# Patient Record
Sex: Female | Born: 1975 | Race: Black or African American | Hispanic: No | Smoking: Current every day smoker
Health system: Southern US, Community
[De-identification: ages and names within clinical notes are randomized; demographics above are authoritative.]

## PROBLEM LIST (undated history)

## (undated) DIAGNOSIS — K5792 Diverticulitis of intestine, part unspecified, without perforation or abscess without bleeding: Secondary | ICD-10-CM

## (undated) DIAGNOSIS — J45909 Unspecified asthma, uncomplicated: Secondary | ICD-10-CM

## (undated) DIAGNOSIS — T4145XA Adverse effect of unspecified anesthetic, initial encounter: Secondary | ICD-10-CM

## (undated) DIAGNOSIS — F32A Depression, unspecified: Secondary | ICD-10-CM

## (undated) DIAGNOSIS — I10 Essential (primary) hypertension: Secondary | ICD-10-CM

## (undated) DIAGNOSIS — T8859XA Other complications of anesthesia, initial encounter: Secondary | ICD-10-CM

## (undated) DIAGNOSIS — E785 Hyperlipidemia, unspecified: Secondary | ICD-10-CM

## (undated) DIAGNOSIS — D219 Benign neoplasm of connective and other soft tissue, unspecified: Secondary | ICD-10-CM

## (undated) HISTORY — PX: TUBAL LIGATION: SHX77

## (undated) HISTORY — DX: Benign neoplasm of connective and other soft tissue, unspecified: D21.9

## (undated) HISTORY — DX: Hyperlipidemia, unspecified: E78.5

## (undated) HISTORY — DX: Depression, unspecified: F32.A

## (undated) HISTORY — PX: GANGLION CYST EXCISION: SHX1691

---

## 1998-08-19 ENCOUNTER — Other Ambulatory Visit: Admission: RE | Admit: 1998-08-19 | Discharge: 1998-08-19 | Payer: Self-pay | Admitting: Obstetrics

## 1998-11-10 ENCOUNTER — Emergency Department (HOSPITAL_COMMUNITY): Admission: EM | Admit: 1998-11-10 | Discharge: 1998-11-10 | Payer: Self-pay | Admitting: Emergency Medicine

## 1998-11-10 ENCOUNTER — Encounter: Payer: Self-pay | Admitting: Emergency Medicine

## 2000-01-28 ENCOUNTER — Emergency Department (HOSPITAL_COMMUNITY): Admission: EM | Admit: 2000-01-28 | Discharge: 2000-01-28 | Payer: Self-pay | Admitting: Emergency Medicine

## 2000-01-31 ENCOUNTER — Other Ambulatory Visit: Admission: RE | Admit: 2000-01-31 | Discharge: 2000-01-31 | Payer: Self-pay | Admitting: Obstetrics

## 2000-03-12 ENCOUNTER — Emergency Department (HOSPITAL_COMMUNITY): Admission: EM | Admit: 2000-03-12 | Discharge: 2000-03-12 | Payer: Self-pay | Admitting: Emergency Medicine

## 2000-04-24 ENCOUNTER — Emergency Department (HOSPITAL_COMMUNITY): Admission: EM | Admit: 2000-04-24 | Discharge: 2000-04-24 | Payer: Self-pay | Admitting: Emergency Medicine

## 2000-04-24 ENCOUNTER — Encounter: Payer: Self-pay | Admitting: Emergency Medicine

## 2001-03-16 ENCOUNTER — Encounter: Payer: Self-pay | Admitting: Emergency Medicine

## 2001-03-16 ENCOUNTER — Emergency Department (HOSPITAL_COMMUNITY): Admission: EM | Admit: 2001-03-16 | Discharge: 2001-03-16 | Payer: Self-pay | Admitting: Emergency Medicine

## 2002-02-22 ENCOUNTER — Emergency Department (HOSPITAL_COMMUNITY): Admission: EM | Admit: 2002-02-22 | Discharge: 2002-02-22 | Payer: Self-pay | Admitting: Emergency Medicine

## 2002-03-26 ENCOUNTER — Emergency Department (HOSPITAL_COMMUNITY): Admission: EM | Admit: 2002-03-26 | Discharge: 2002-03-26 | Payer: Self-pay | Admitting: Emergency Medicine

## 2002-03-26 ENCOUNTER — Encounter: Payer: Self-pay | Admitting: Emergency Medicine

## 2002-12-03 ENCOUNTER — Emergency Department (HOSPITAL_COMMUNITY): Admission: EM | Admit: 2002-12-03 | Discharge: 2002-12-03 | Payer: Self-pay | Admitting: *Deleted

## 2002-12-09 ENCOUNTER — Emergency Department (HOSPITAL_COMMUNITY): Admission: EM | Admit: 2002-12-09 | Discharge: 2002-12-09 | Payer: Self-pay | Admitting: Emergency Medicine

## 2003-05-20 ENCOUNTER — Emergency Department (HOSPITAL_COMMUNITY): Admission: AD | Admit: 2003-05-20 | Discharge: 2003-05-20 | Payer: Self-pay | Admitting: Family Medicine

## 2004-07-18 ENCOUNTER — Emergency Department (HOSPITAL_COMMUNITY): Admission: EM | Admit: 2004-07-18 | Discharge: 2004-07-18 | Payer: Self-pay | Admitting: Emergency Medicine

## 2004-08-17 ENCOUNTER — Emergency Department: Payer: Self-pay | Admitting: Emergency Medicine

## 2004-09-03 ENCOUNTER — Emergency Department (HOSPITAL_COMMUNITY): Admission: EM | Admit: 2004-09-03 | Discharge: 2004-09-03 | Payer: Self-pay | Admitting: Emergency Medicine

## 2004-09-17 ENCOUNTER — Emergency Department: Payer: Self-pay | Admitting: Emergency Medicine

## 2006-06-13 ENCOUNTER — Emergency Department (HOSPITAL_COMMUNITY): Admission: EM | Admit: 2006-06-13 | Discharge: 2006-06-13 | Payer: Self-pay | Admitting: Family Medicine

## 2007-07-07 ENCOUNTER — Emergency Department (HOSPITAL_COMMUNITY): Admission: EM | Admit: 2007-07-07 | Discharge: 2007-07-07 | Payer: Self-pay | Admitting: Family Medicine

## 2008-03-29 ENCOUNTER — Emergency Department (HOSPITAL_COMMUNITY): Admission: EM | Admit: 2008-03-29 | Discharge: 2008-03-29 | Payer: Self-pay | Admitting: Family Medicine

## 2008-04-01 ENCOUNTER — Emergency Department (HOSPITAL_COMMUNITY): Admission: EM | Admit: 2008-04-01 | Discharge: 2008-04-01 | Payer: Self-pay | Admitting: Family Medicine

## 2009-01-07 ENCOUNTER — Emergency Department (HOSPITAL_COMMUNITY): Admission: EM | Admit: 2009-01-07 | Discharge: 2009-01-07 | Payer: Self-pay | Admitting: Family Medicine

## 2009-01-09 ENCOUNTER — Emergency Department (HOSPITAL_COMMUNITY): Admission: EM | Admit: 2009-01-09 | Discharge: 2009-01-09 | Payer: Self-pay | Admitting: Emergency Medicine

## 2009-06-01 ENCOUNTER — Emergency Department (HOSPITAL_COMMUNITY): Admission: EM | Admit: 2009-06-01 | Discharge: 2009-06-01 | Payer: Self-pay | Admitting: Emergency Medicine

## 2009-08-27 ENCOUNTER — Emergency Department (HOSPITAL_COMMUNITY): Admission: EM | Admit: 2009-08-27 | Discharge: 2009-08-27 | Payer: Self-pay | Admitting: Emergency Medicine

## 2009-09-09 ENCOUNTER — Emergency Department (HOSPITAL_COMMUNITY)
Admission: EM | Admit: 2009-09-09 | Discharge: 2009-09-09 | Payer: Self-pay | Source: Home / Self Care | Admitting: Emergency Medicine

## 2010-05-18 ENCOUNTER — Emergency Department (HOSPITAL_COMMUNITY): Admission: EM | Admit: 2010-05-18 | Discharge: 2010-05-18 | Payer: Self-pay | Admitting: Family Medicine

## 2010-05-26 ENCOUNTER — Emergency Department (HOSPITAL_COMMUNITY): Admission: EM | Admit: 2010-05-26 | Discharge: 2010-05-26 | Payer: Self-pay | Admitting: Family Medicine

## 2010-07-09 IMAGING — CT CT NECK W/ CM
3 of 4 series · 16 of 33 positions shown, 19 images · IV contrast (100 ML OMNI 300)
Comparison: None available.

CLINICAL DATA: Sore throat.  Hoarseness.

CT NECK WITH CONTRAST
TECHNIQUE: Multidetector CT imaging of the neck was performed with
intravenous contrast.
Contrast: 100 ml Omnipaque 300

[Series 2: 2cc/(id)/1cc/(id) · axial · 0.47mm/px · z∈[-226,-27]mm · 8 of 65 slices shown, 10 images]
[im 6/65  soft-tissue]
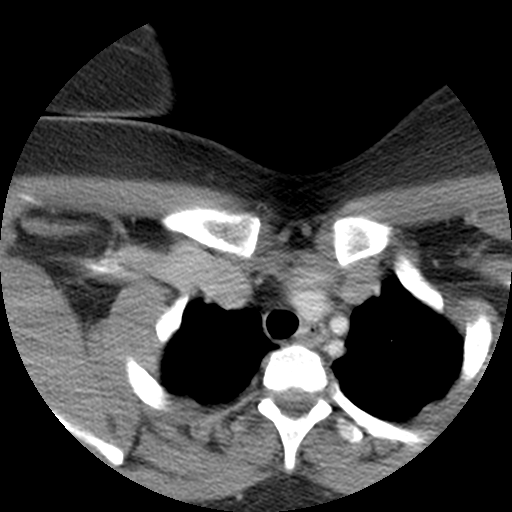
[im 6/65  bone]
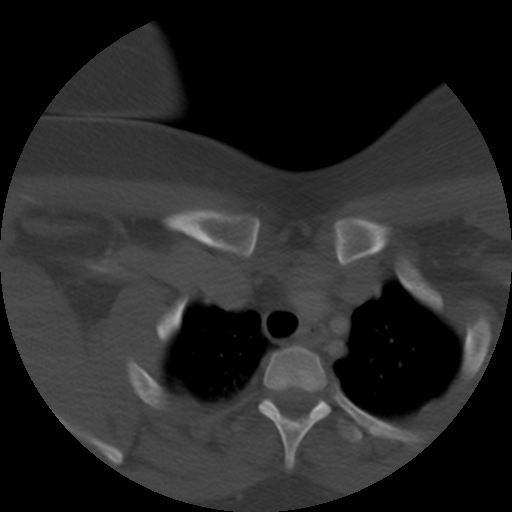
[im 12/65  bone]
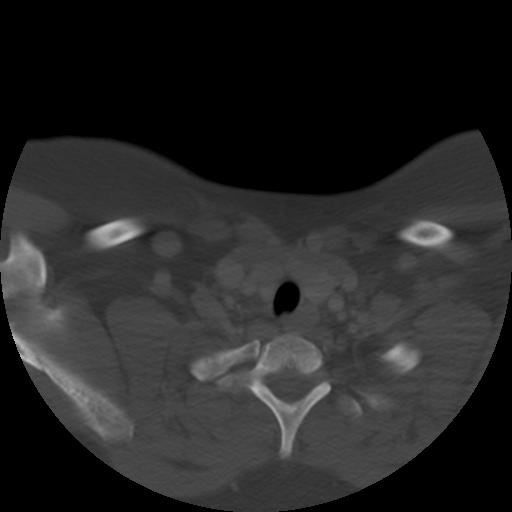
[im 24/65  bone]
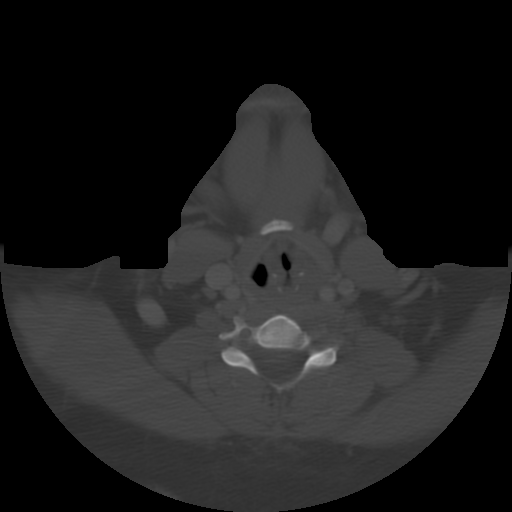
[im 30/65  bone]
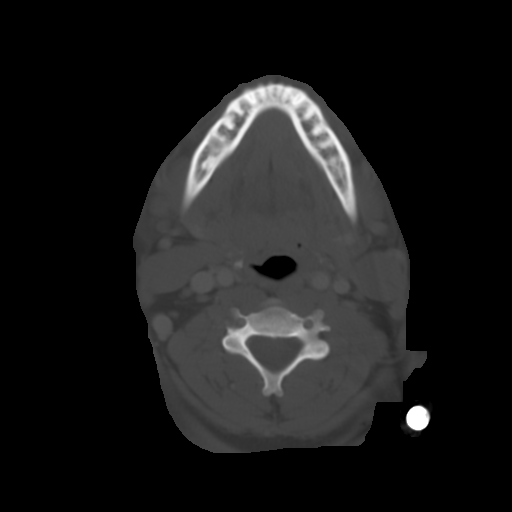
[im 35/65  soft-tissue]
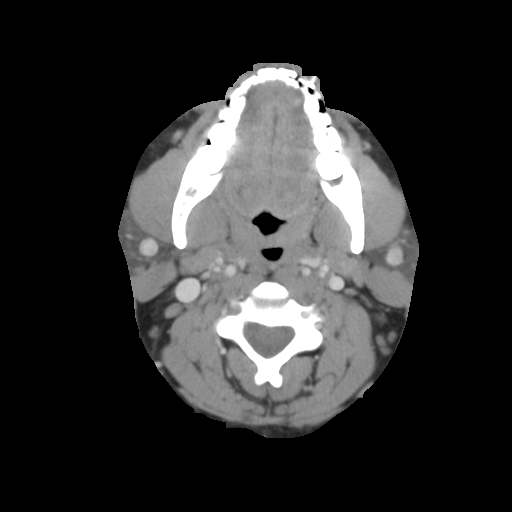
[im 35/65  bone]
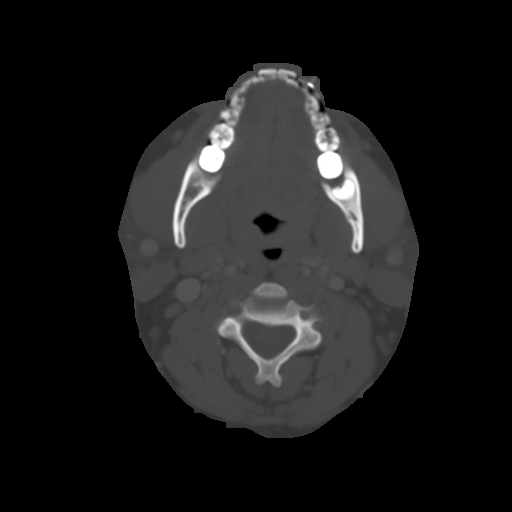
[im 41/65  bone]
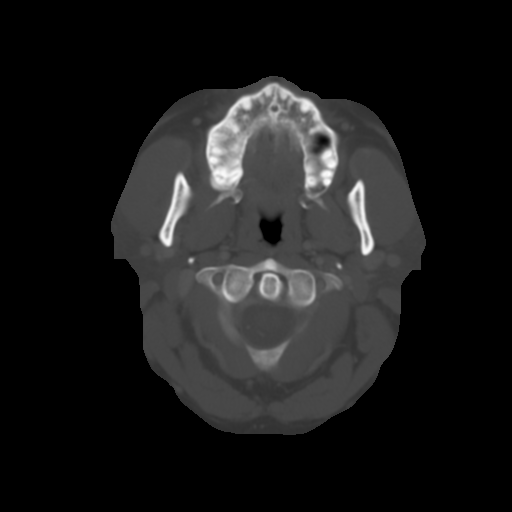
[im 53/65  bone]
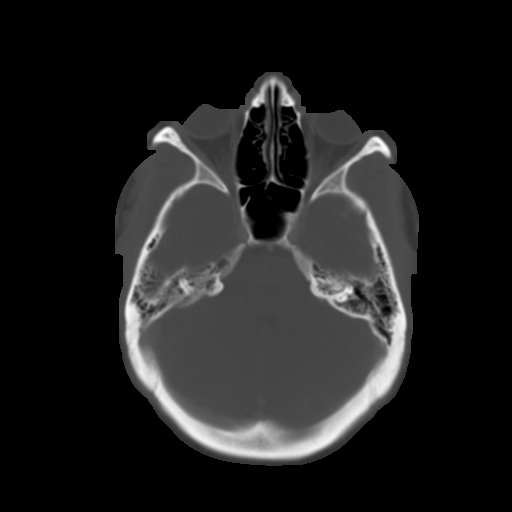
[im 59/65  bone]
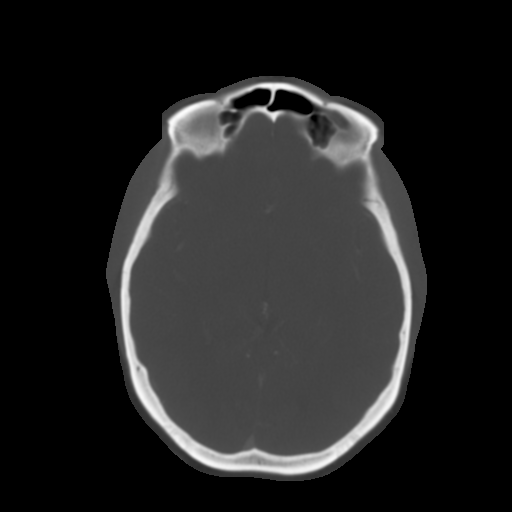

[Series 300: cor · coronal · 0.48mm/px · 3 of 66 slices shown]
[im 14/66  bone]
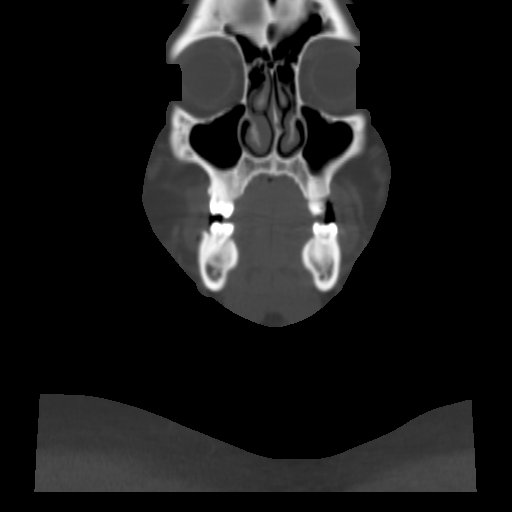
[im 27/66  bone]
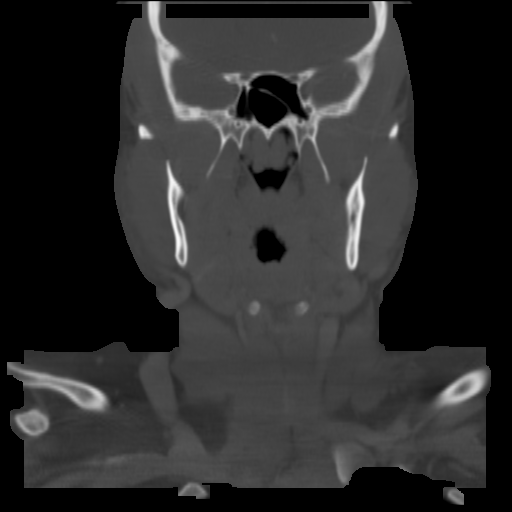
[im 40/66  bone]
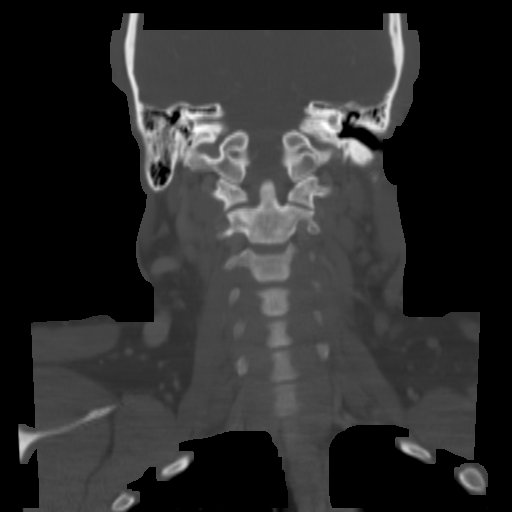

[Series 301: sag · sagittal · 0.48mm/px · 5 of 55 slices shown, 6 images]
[im 19/55  bone]
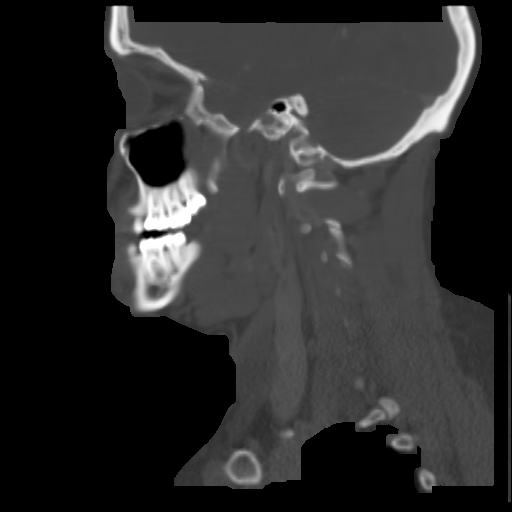
[im 23/55  bone]
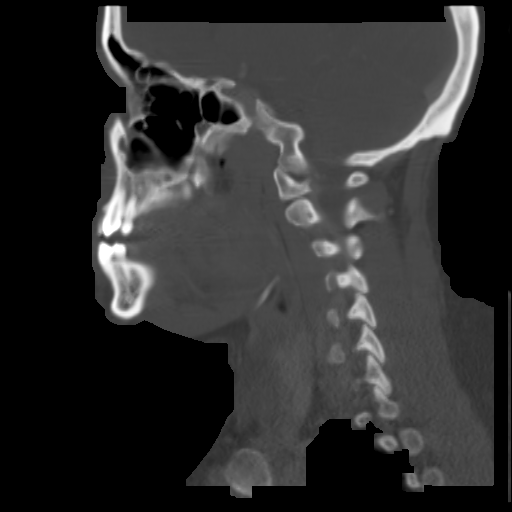
[im 28/55  soft-tissue]
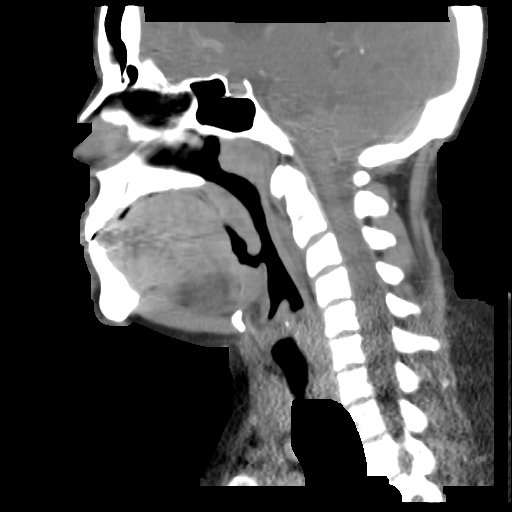
[im 28/55  bone]
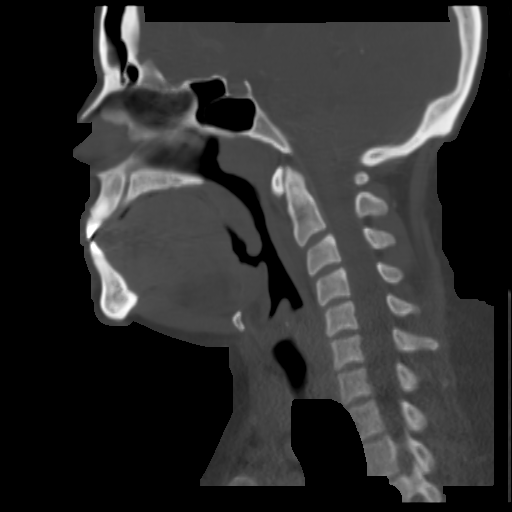
[im 32/55  bone]
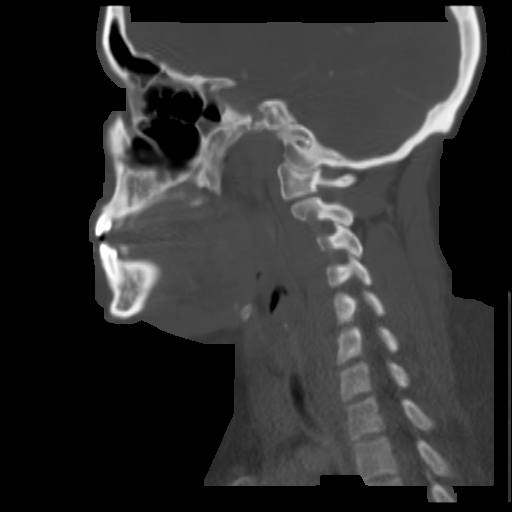
[im 37/55  bone]
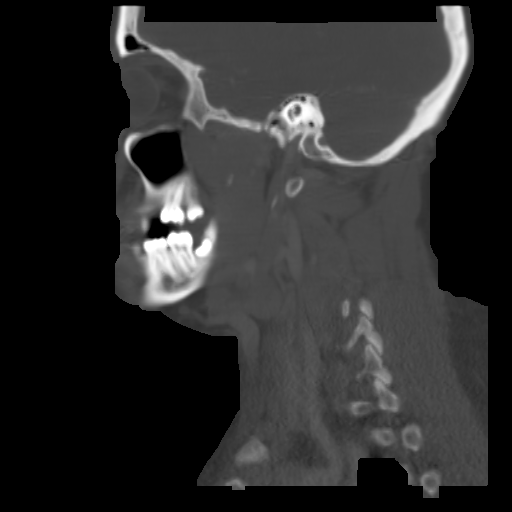

[16 of 33 positions shown; findings below may reference images not displayed]

FINDINGS: Limited imaging of the brain is within normal limits.
There is prominence of the adenoid tissue without focal lesion.
The tongue base and tonsillar regions are unremarkable.  No focal
fluid collection is present.  The mucosal and submucosal spaces are
within normal limits.

Bilateral enlarged submandibular lymph nodes are present.  The
largest is a 13 mm long axis node on the right.  There are
borderline level II lymph nodes bilaterally with a jugulodigastric
node on the left measuring up to 15 mm.  No other significant
pathologically enlarged adenopathy is present.

Lung apices are clear.  Bone windows are unremarkable.
IMPRESSION: 1.  No focal fluid collection to suggest abscess.
2.  Prominent adenoid tissue is likely reactive.  The HIV can give
a similar appearance.
3.  Enlarged submandibular and level II adenopathy is likely
reactive.  Please see the above report.

## 2010-11-22 LAB — DIFFERENTIAL
Eosinophils Absolute: 0 10*3/uL (ref 0.0–0.7)
Eosinophils Relative: 0 % (ref 0–5)
Lymphocytes Relative: 13 % (ref 12–46)
Monocytes Absolute: 0.2 10*3/uL (ref 0.1–1.0)
Neutrophils Relative %: 86 % — ABNORMAL HIGH (ref 43–77)

## 2010-11-22 LAB — CBC
MCHC: 32.8 g/dL (ref 30.0–36.0)
RBC: 5.21 MIL/uL — ABNORMAL HIGH (ref 3.87–5.11)

## 2010-11-22 LAB — BASIC METABOLIC PANEL
CO2: 25 mEq/L (ref 19–32)
Calcium: 9.1 mg/dL (ref 8.4–10.5)
Creatinine, Ser: 0.79 mg/dL (ref 0.4–1.2)
GFR calc Af Amer: >60 mL/min (ref >60)
GFR calc non Af Amer: >60 mL/min (ref >60)

## 2010-11-22 LAB — APTT: aPTT: 30 seconds (ref 24–37)

## 2010-11-22 LAB — HCG, SERUM, QUALITATIVE: Preg, Serum: NEGATIVE

## 2011-01-21 ENCOUNTER — Inpatient Hospital Stay (INDEPENDENT_AMBULATORY_CARE_PROVIDER_SITE_OTHER)
Admission: RE | Admit: 2011-01-21 | Discharge: 2011-01-21 | Disposition: A | Discharge status: Home / Self Care | Payer: Self-pay | Source: Ambulatory Visit | Attending: Emergency Medicine | Admitting: Emergency Medicine

## 2011-01-21 DIAGNOSIS — J019 Acute sinusitis, unspecified: Secondary | ICD-10-CM

## 2011-01-21 DIAGNOSIS — R059 Cough, unspecified: Secondary | ICD-10-CM

## 2011-01-21 DIAGNOSIS — R05 Cough: Secondary | ICD-10-CM

## 2011-04-09 ENCOUNTER — Emergency Department (HOSPITAL_COMMUNITY)
Admission: EM | Admit: 2011-04-09 | Discharge: 2011-04-09 | Disposition: A | Discharge status: Home / Self Care | Payer: Self-pay | Attending: Emergency Medicine | Admitting: Emergency Medicine

## 2011-04-09 DIAGNOSIS — J029 Acute pharyngitis, unspecified: Secondary | ICD-10-CM | POA: Insufficient documentation

## 2011-04-09 DIAGNOSIS — H9209 Otalgia, unspecified ear: Secondary | ICD-10-CM | POA: Insufficient documentation

## 2011-04-09 DIAGNOSIS — R059 Cough, unspecified: Secondary | ICD-10-CM | POA: Insufficient documentation

## 2011-04-09 DIAGNOSIS — R05 Cough: Secondary | ICD-10-CM | POA: Insufficient documentation

## 2011-04-09 DIAGNOSIS — E669 Obesity, unspecified: Secondary | ICD-10-CM | POA: Insufficient documentation

## 2011-04-09 DIAGNOSIS — R599 Enlarged lymph nodes, unspecified: Secondary | ICD-10-CM | POA: Insufficient documentation

## 2011-04-11 ENCOUNTER — Inpatient Hospital Stay (INDEPENDENT_AMBULATORY_CARE_PROVIDER_SITE_OTHER)
Admission: RE | Admit: 2011-04-11 | Discharge: 2011-04-11 | Disposition: A | Discharge status: Home / Self Care | Payer: Self-pay | Source: Ambulatory Visit | Attending: Emergency Medicine | Admitting: Emergency Medicine

## 2011-04-11 DIAGNOSIS — R05 Cough: Secondary | ICD-10-CM

## 2011-04-11 DIAGNOSIS — R51 Headache: Secondary | ICD-10-CM

## 2011-04-11 DIAGNOSIS — M799 Soft tissue disorder, unspecified: Secondary | ICD-10-CM

## 2011-04-11 DIAGNOSIS — R059 Cough, unspecified: Secondary | ICD-10-CM

## 2011-08-15 DIAGNOSIS — D219 Benign neoplasm of connective and other soft tissue, unspecified: Secondary | ICD-10-CM

## 2011-08-15 HISTORY — DX: Benign neoplasm of connective and other soft tissue, unspecified: D21.9

## 2012-03-31 ENCOUNTER — Emergency Department (HOSPITAL_COMMUNITY): Payer: Medicaid Other

## 2012-03-31 ENCOUNTER — Emergency Department (INDEPENDENT_AMBULATORY_CARE_PROVIDER_SITE_OTHER)
Admission: EM | Admit: 2012-03-31 | Discharge: 2012-03-31 | Disposition: A | Discharge status: Nursing Home (Includes ICF) | Payer: Medicaid Other | Source: Home / Self Care

## 2012-03-31 ENCOUNTER — Encounter (HOSPITAL_COMMUNITY): Payer: Self-pay | Admitting: *Deleted

## 2012-03-31 ENCOUNTER — Emergency Department (HOSPITAL_COMMUNITY)
Admission: EM | Admit: 2012-03-31 | Discharge: 2012-03-31 | Disposition: A | Discharge status: Home / Self Care | Payer: Medicaid Other | Attending: Emergency Medicine | Admitting: Emergency Medicine

## 2012-03-31 DIAGNOSIS — D259 Leiomyoma of uterus, unspecified: Secondary | ICD-10-CM | POA: Insufficient documentation

## 2012-03-31 DIAGNOSIS — N9489 Other specified conditions associated with female genital organs and menstrual cycle: Secondary | ICD-10-CM | POA: Insufficient documentation

## 2012-03-31 DIAGNOSIS — N920 Excessive and frequent menstruation with regular cycle: Secondary | ICD-10-CM

## 2012-03-31 DIAGNOSIS — M549 Dorsalgia, unspecified: Secondary | ICD-10-CM

## 2012-03-31 LAB — WET PREP, GENITAL
Clue Cells Wet Prep HPF POC: NONE SEEN
Trich, Wet Prep: NONE SEEN
Yeast Wet Prep HPF POC: NONE SEEN

## 2012-03-31 LAB — POCT I-STAT, CHEM 8
BUN: 10 mg/dL (ref 6–23)
Calcium, Ion: 1.23 mmol/L (ref 1.12–1.23)
Creatinine, Ser: 0.8 mg/dL (ref 0.50–1.10)
TCO2: 24 mmol/L (ref 0–100)

## 2012-03-31 LAB — POCT URINALYSIS DIP (DEVICE)
Ketones, ur: NEGATIVE mg/dL
Protein, ur: NEGATIVE mg/dL
Specific Gravity, Urine: >=1.03 (ref 1.005–1.030)
pH: 5.5 (ref 5.0–8.0)

## 2012-03-31 MED ORDER — OXYCODONE-ACETAMINOPHEN 5-325 MG PO TABS
1.0000 | ORAL_TABLET | Freq: Once | ORAL | Status: AC
  Administered 2012-03-31: 1 via ORAL
  Filled 2012-03-31: qty 1

## 2012-03-31 MED ORDER — OXYCODONE-ACETAMINOPHEN 5-325 MG PO TABS: 1.0000 | ORAL_TABLET | ORAL | Status: AC | PRN

## 2012-03-31 NOTE — ED Provider Notes (Signed)
Medical screening examination/treatment/procedure(s) were performed by non-physician practitioner and as supervising physician I was immediately available for consultation/collaboration.  Raynald Blend, MD 03/31/12 [phone]

## 2012-03-31 NOTE — ED Notes (Signed)
C/o lower back pain radiating into RLQ x 4 days, progressively worsening. Denies n/v/d, urinary frequency, dysuria. States all symptoms started at same time period started. Denies injury. Pain worsens with mvmt.

## 2012-03-31 NOTE — ED Notes (Signed)
Patient transported to Ultrasound 

## 2012-03-31 NOTE — ED Notes (Addendum)
Pt reports severe lower back pain x 5 days. Pt was seen at urgent are and sent to ED for further eval.  Reports difficulty with urination this am. Pt is starting to go off menstrual cycle.

## 2012-03-31 NOTE — ED Provider Notes (Signed)
History     CSN: [id number]  Arrival date & time 03/31/12  1539   First MD Initiated Contact with Patient 03/31/12 1653      Chief Complaint  Patient presents with  . Back Pain    HPI:  36 year old woman presenting with abdominal pain.  Onset was 4 days ago when she woke up.  No identifiable inciting events.  Quality is sharp.  Pain is in the right lower back, right flank, right lower quadrant, and suprapubic.  Pain does not radiate.  Severity is 8-9/10.  It is constant.  Walking and staying in one position too long makes it worse.  Nothing makes it better.  Ibuprofen has been tried without any relief.  Her period began a day after the pain presented.  It is heavier than usual.  She is using 8 tampons and 8 pads a day.  Her last few periods have been heavy like this one is.  They last 4 to 6 days.  They are regular.  She denies headaches, dizziness, dyspnea, chest pain, dysuria, hematuria, constipation, diarrhea, hematochezia, melena, fevers, chills, nausea, vomiting, weakness, numbness, saddle paraesthesias, and incontinence.   History reviewed. No pertinent past medical history.  Past Surgical History  Procedure Date  . Tubal ligation     History reviewed. No pertinent family history.  History  Substance Use Topics  . Smoking status: Never Smoker   . Smokeless tobacco: Not on file  . Alcohol Use: No   History  Sexual Activity  . Sexually Active: Yes -- Female partner(s)    monogamous, 4 years, no toys    OB History    Grav Para Term Preterm Abortions TAB SAB Ect Mult Living                  Review of Systems  All other systems reviewed and are negative.    Allergies  Review of patient's allergies indicates no known allergies.  Home Medications   Current Outpatient Rx  Name Route Sig Dispense Refill  . IBUPROFEN 200 MG PO TABS Oral Take 800 mg by mouth every 6 (six) hours as needed. For pain      BP 109/66  Pulse 82  Temp 98 F (36.7 C) (Oral)  Resp  18  SpO2 100%  LMP 03/28/2012  Physical Exam  Constitutional: She appears well-developed and well-nourished. No distress.  HENT:  Mouth/Throat: Uvula is midline, oropharynx is clear and moist and mucous membranes are normal.  Eyes: Conjunctivae and EOM are normal. Pupils are equal, round, and reactive to light.  Neck: Normal range of motion and full passive range of motion without pain. Neck supple.  Cardiovascular: Normal rate, regular rhythm, normal heart sounds and normal pulses.   No murmur heard. Pulmonary/Chest: Effort normal and breath sounds normal. She exhibits no tenderness.  Abdominal: Soft. Normal appearance and bowel sounds are normal. She exhibits no mass. There is no hepatosplenomegaly. There is tenderness in the right lower quadrant and suprapubic area. There is no rigidity, no rebound and no guarding.  Genitourinary:       Normal external genitalia.  Normal-appearing vaginal vault and cervix.  Dark blood present in vaginal vault and in cervical os.  Endocervical & exocervical swabs taken for testing. Bimanual exam reveals pain in right adnexa.  No masses palpated.  Exam limited by body habitus.   Musculoskeletal:       Right hip: Normal.       Left hip: Normal.  Right knee: Normal.       Left knee: Normal.       Cervical back: Normal.       Lumbar back: She exhibits decreased range of motion and tenderness.  Lymphadenopathy:    She has no cervical adenopathy.  Neurological: She has normal strength. No cranial nerve deficit or sensory deficit. Gait normal.  Skin: Skin is warm, dry and intact.    ED Course  Procedures (including critical care time)  6:29 PM - Pt reassessed.  Pain unchanged.  Percocet just given.  Pelvic exam completed.  7:46 PM - Pt reassessed.  Pain is somewhat improved.  Fibroids on ultrasound.  Pt informed and given instructions on pain management and follow up.  Labs Reviewed  WET PREP, GENITAL - Abnormal; Notable for the following:     WBC, Wet Prep HPF POC RARE (*)     All other components within normal limits  GC/CHLAMYDIA PROBE AMP, GENITAL   US Transvaginal Non-ob  03/31/2012  *RADIOLOGY REPORT*  Clinical Data:  Pelvic pain.  Rule out torsion  TRANSABDOMINAL AND TRANSVAGINAL ULTRASOUND OF PELVIS DOPPLER ULTRASOUND OF OVARIES  Technique:  Both transabdominal and transvaginal ultrasound examinations of the pelvis were performed. Transabdominal technique was performed for global imaging of the pelvis including uterus, ovaries, adnexal regions, and pelvic cul-de-sac.  It was necessary to proceed with endovaginal exam following the transabdominal exam to visualize the ovaries and endometrium.  Color and duplex Doppler ultrasound was utilized to evaluate blood flow to the ovaries.  Comparison:  None.  Findings:  Uterus:  10.9 x 6.8 x 7.0 cm.  Uterus is retroflexed.  Posterior fundal fibroid on the right measures 2.0 x 3.0 x 2.2 cm.  Posterior fundal fibroid on the left measures 3.7 x 3.4 x 2.8 cm.  Endometrium:  6.3 mm  Right ovary: Normal appearance/no adnexal mass  Left ovary:   Normal appearance/no adnexal mass  Pulsed Doppler evaluation demonstrates normal low-resistance arterial and venous waveforms in both ovaries.  IMPRESSION: Uterine fibroid tumors.  No sonographic evidence for ovarian torsion.  Original Report Authenticated By: Camelia Phenes, M.D.   US Pelvis Complete  03/31/2012  *RADIOLOGY REPORT*  Clinical Data:  Pelvic pain.  Rule out torsion  TRANSABDOMINAL AND TRANSVAGINAL ULTRASOUND OF PELVIS DOPPLER ULTRASOUND OF OVARIES  Technique:  Both transabdominal and transvaginal ultrasound examinations of the pelvis were performed. Transabdominal technique was performed for global imaging of the pelvis including uterus, ovaries, adnexal regions, and pelvic cul-de-sac.  It was necessary to proceed with endovaginal exam following the transabdominal exam to visualize the ovaries and endometrium.  Color and duplex Doppler ultrasound was  utilized to evaluate blood flow to the ovaries.  Comparison:  None.  Findings:  Uterus:  10.9 x 6.8 x 7.0 cm.  Uterus is retroflexed.  Posterior fundal fibroid on the right measures 2.0 x 3.0 x 2.2 cm.  Posterior fundal fibroid on the left measures 3.7 x 3.4 x 2.8 cm.  Endometrium:  6.3 mm  Right ovary: Normal appearance/no adnexal mass  Left ovary:   Normal appearance/no adnexal mass  Pulsed Doppler evaluation demonstrates normal low-resistance arterial and venous waveforms in both ovaries.  IMPRESSION: Uterine fibroid tumors.  No sonographic evidence for ovarian torsion.  Original Report Authenticated By: Camelia Phenes, M.D.   [place] Art/ven Flow Abd Pelv Doppler  03/31/2012  *RADIOLOGY REPORT*  Clinical Data:  Pelvic pain.  Rule out torsion  TRANSABDOMINAL AND TRANSVAGINAL ULTRASOUND OF PELVIS DOPPLER ULTRASOUND OF OVARIES  Technique:  Both transabdominal and transvaginal ultrasound examinations of the pelvis were performed. Transabdominal technique was performed for global imaging of the pelvis including uterus, ovaries, adnexal regions, and pelvic cul-de-sac.  It was necessary to proceed with endovaginal exam following the transabdominal exam to visualize the ovaries and endometrium.  Color and duplex Doppler ultrasound was utilized to evaluate blood flow to the ovaries.  Comparison:  None.  Findings:  Uterus:  10.9 x 6.8 x 7.0 cm.  Uterus is retroflexed.  Posterior fundal fibroid on the right measures 2.0 x 3.0 x 2.2 cm.  Posterior fundal fibroid on the left measures 3.7 x 3.4 x 2.8 cm.  Endometrium:  6.3 mm  Right ovary: Normal appearance/no adnexal mass  Left ovary:   Normal appearance/no adnexal mass  Pulsed Doppler evaluation demonstrates normal low-resistance arterial and venous waveforms in both ovaries.  IMPRESSION: Uterine fibroid tumors.  No sonographic evidence for ovarian torsion.  Original Report Authenticated By: Camelia Phenes, M.D.     1. Uterine fibroid       MDM  1.   Uterine  Fibroids:  Responsible for recent menorrhagia and now dysmenorrhea. Hemoglobin is 12.9, so acute blood loss anemia not a concern.  Patient will call gynecologist Monday morning.  Urinalysis was normal, effectively ruling out urolithiasis and UTI.  History and physical not suggestive of gastrointestinal pathology.  Appendicitis not suspected.   Lollie Sails, MD 03/31/12 [phone]

## 2012-03-31 NOTE — ED Notes (Signed)
Pt is here with complaints of lower back pain associated with her menstrual cycle and difficulty urinating this am.  Pt denies any vaginal discharge or STD exposure as she has been abstinent X 3 years.

## 2012-03-31 NOTE — ED Provider Notes (Signed)
I saw and evaluated the patient, reviewed the resident's note and I agree with the findings and plan.  No guard or rebound, right flank, RLQ pain, tenderness, voluntary guarding mild with light palpation.  Some exaggeration of response noted.  No fever, no  Vomiting, U/S of pelvis shows no torsion, abscess.  Will prescribe analgesics, appropriate for outpt follow up with own GYN.    Gavin Pound. Oletta Lamas, MD 03/31/12 2002

## 2012-03-31 NOTE — ED Notes (Signed)
Pt had urinalysis and blood work at [facility]

## 2012-03-31 NOTE — ED Provider Notes (Signed)
History     CSN: [id number]  Arrival date & time 03/31/12  1221   None     Chief Complaint  Patient presents with  . Back Pain  . Urinary Retention    (Consider location/radiation/quality/duration/timing/severity/associated sxs/prior treatment) Patient is a 36 y.o. female presenting with back pain. The history is provided by the patient.  Back Pain  Associated symptoms include abdominal pain and pelvic pain. Pertinent negatives include no dysuria.   female complains of lower back pain radiating into groin that began two days prior to menses.  States pain is sharp and crampy in nature associated with heavy vaginal bleeding and urinary retention. Pain not improved with ibuprofen every six hours.  Reports for the past four months noted heavy menses lasting from 9-11 days associated with moderate cramping; todays pain is the worst.  Appointment made with ob/gyn but was unable to keep appointment.  No nausea, fatigue, fever, vaginal discharge, vomiting, diarrhea, UTI symptoms.  Sexually active, but with a woman for the past 3 years, no change in partners.  Denies history of known exposure to STD or symptoms in partner.  Patient's last menstrual period was 03/28/2012.  SH-tubal ligation.  FH of heavy menses and subsequent hysterectomy.    History reviewed. No pertinent past medical history.  History reviewed. No pertinent past surgical history.  History reviewed. No pertinent family history.  History  Substance Use Topics  . Smoking status: Never Smoker   . Smokeless tobacco: Not on file  . Alcohol Use: No    OB History    Grav Para Term Preterm Abortions TAB SAB Ect Mult Living                  Review of Systems  Constitutional: Negative.   Respiratory: Negative.   Cardiovascular: Negative.   Gastrointestinal: Positive for abdominal pain. Negative for nausea, vomiting, diarrhea, constipation, blood in stool and rectal pain.  Genitourinary: Positive for flank pain, vaginal  bleeding, difficulty urinating, menstrual problem and pelvic pain. Negative for dysuria, urgency, frequency, hematuria and vaginal discharge.  Musculoskeletal: Positive for back pain.    Allergies  Review of patient's allergies indicates no known allergies.  Home Medications  No current outpatient prescriptions on file.  BP 142/84  Pulse 112  Temp 98.7 F (37.1 C) (Oral)  Resp 22  SpO2 97%  LMP 03/28/2012  Physical Exam  Nursing note and vitals reviewed. Constitutional: She is oriented to person, place, and time. Vital signs are normal. She appears well-developed and well-nourished. She is active and cooperative.  HENT:  Head: Normocephalic.  Mouth/Throat: Uvula is midline, oropharynx is clear and moist and mucous membranes are normal.  Eyes: Conjunctivae are normal. Pupils are equal, round, and reactive to light. No scleral icterus.  Neck: Trachea normal. Neck supple.  Cardiovascular: Normal rate, regular rhythm, normal heart sounds and normal pulses.   Pulmonary/Chest: Effort normal and breath sounds normal.  Abdominal: Normal appearance. There is tenderness. There is CVA tenderness. There is no rebound.  Lymphadenopathy:    She has no cervical adenopathy.  Neurological: She is alert and oriented to person, place, and time. No cranial nerve deficit or sensory deficit.  Skin: Skin is warm and dry.  Psychiatric: She has a normal mood and affect. Her speech is normal and behavior is normal. Judgment and thought content normal. Cognition and memory are normal.    ED Course  Procedures (including critical care time)   Labs Reviewed  POCT URINALYSIS DIP (DEVICE)  POCT  I-STAT, CHEM 8   No results found.   1. Back pain   2. Menorrhagia       MDM  Transfer to [facility] for further evaluation and treatment of pain, urinary retention and menorrhagia.          Johnsie Kindred, NP 03/31/12 1541

## 2012-04-02 LAB — GC/CHLAMYDIA PROBE AMP, GENITAL
Chlamydia, DNA Probe: NEGATIVE
GC Probe Amp, Genital: NEGATIVE

## 2012-04-11 ENCOUNTER — Ambulatory Visit (INDEPENDENT_AMBULATORY_CARE_PROVIDER_SITE_OTHER): Payer: Medicaid Other | Admitting: Obstetrics and Gynecology

## 2012-04-11 ENCOUNTER — Encounter: Payer: Self-pay | Admitting: Obstetrics and Gynecology

## 2012-04-11 VITALS — BP 122/70 | Wt 252.0 lb

## 2012-04-11 DIAGNOSIS — N946 Dysmenorrhea, unspecified: Secondary | ICD-10-CM

## 2012-04-11 DIAGNOSIS — N920 Excessive and frequent menstruation with regular cycle: Secondary | ICD-10-CM

## 2012-04-11 DIAGNOSIS — D259 Leiomyoma of uterus, unspecified: Secondary | ICD-10-CM

## 2012-04-11 DIAGNOSIS — D219 Benign neoplasm of connective and other soft tissue, unspecified: Secondary | ICD-10-CM

## 2012-04-11 NOTE — Patient Instructions (Signed)
Fibroids You have been diagnosed as having a fibroid. Fibroids are smooth muscle lumps (tumors) which can occur any place in a woman's body. They are usually in the womb (uterus). The most common problem (symptom) of fibroids is bleeding. Over time this may cause low red blood cells (anemia). Other symptoms include feelings of pressure and pain in the pelvis. The diagnosis (learning what is wrong) of fibroids is made by physical exam. Sometimes tests such as an ultrasound are used. This is helpful when fibroids are felt around the ovaries and to look for tumors. TREATMENT   Most fibroids do not need surgical or medical treatment. Sometimes a tissue sample (biopsy) of the lining of the uterus is done to rule out cancer. If there is no cancer and only a small amount of bleeding, the problem can be watched.   Hormonal treatment can improve the problem.   When surgery is needed, it can consist of removing the fibroid. Vaginal birth may not be possible after the removal of fibroids. This depends on where they are and the extent of surgery. When pregnancy occurs with fibroids it is usually normal.   Your caregiver can help decide which treatments are best for you.  HOME CARE INSTRUCTIONS   Do not use aspirin as this may increase bleeding problems.   If your periods (menses) are heavy, record the number of pads or tampons used per month. Bring this information to your caregiver. This can help them determine the best treatment for you.  SEEK IMMEDIATE MEDICAL CARE IF:  You have pelvic pain or cramps not controlled with medications, or experience a sudden increase in pain.   You have an increase of pelvic bleeding between and during menses.   You feel lightheaded or have fainting spells.   You develop worsening belly (abdominal) pain.  Document Released: 07/28/2000 Document Revised: 07/20/2011 Document Reviewed: 03/19/2008 [employer] Patient Information 2012 [employer].    

## 2012-04-11 NOTE — Progress Notes (Signed)
Please schedule pt for [facility] and [facility] and to discuss ablation with me

## 2012-04-11 NOTE — Progress Notes (Signed)
36 YO seen at [facility] ER for pelvic pain on 03/31/12 and a pelvic ultrasound showed  Uterus: 10.9 x 6.8 x 7.0 cm. (retroflexed), Posterior fundal fibroid on the right measures 2.0 x 3.0 x 2.2 cm, . Posterior fundal fibroid on the left measures 3.7 x 3.4 x 2.8 cm. with Endometrium: 6.3 mm both ovaries appeared normal on this study.   Her pain initially started with severe back pain (labor-like) unresponsive to OTC analgesia then this transitioned  into cramps (as her period started) during her  8 day flow.   She used ten  super tampons each day stating this duration and volume has been typical for her over the past 6 months.  She d enies any intermenstrual bleeding or pelvic pain,  except with menses. [H/H=12.9/38.0 on 03/31/12]    O:  Neck: supple without masses or thyromegaly       Heart:  RRR       Lungs: clear      Abdomen: soft, non-tender      Pelvic:  EGBUS-wnl, vagina-normal with small blood, cervix-no lesions, uterus- enlarged, tender and difficult to assess due to body habitus,      adnexae-no tenderness  A:  Menorrhagia      S/P BTL      Uterine Fibroids      Dysmenorrhea  P:   Reviewed options: observation, BCPs, Mirena, ablation, myomectomy, fibroid embolization, Lysteda and hysterectomy.        Patient would like to have an endometrial ablation;  reviewed endometrial ablation; brochure to be given        Reviewed endometrial biopsy procedure & indicaition.         Reviewed Fibroids, naturral history and management options both medical & surgical         To advise Dr. Normand Sloop of patient's desire for ablation and consideration for in-office procedure         TSH-pending        RTO-TBA  Joshlynn Alfonzo, PA-C

## 2012-04-16 ENCOUNTER — Other Ambulatory Visit: Payer: Self-pay

## 2012-04-16 ENCOUNTER — Telehealth: Payer: Self-pay | Admitting: Obstetrics and Gynecology

## 2012-04-16 DIAGNOSIS — N926 Irregular menstruation, unspecified: Secondary | ICD-10-CM

## 2012-04-16 NOTE — Telephone Encounter (Signed)
NICCOLE/EPIC

## 2012-04-16 NOTE — Telephone Encounter (Signed)
Please let the patient know that [facility] and EMBX  Is the first tests we do before the ablation.  It measures the uterus to make sure the novasure is not to big and rules out cancer

## 2012-04-16 NOTE — Telephone Encounter (Signed)
Pt states she suppose to have novasure and she don not want to have sonohyst per [place]

## 2012-04-17 NOTE — Telephone Encounter (Signed)
I have never met this patient.  Please tell her she has the following options 1.  Come in for a consult with me to discuss why I recommend the tests 2.  Have the tests and meet with me 3.  Leave the practice and find another physician to take care of her. Let me know her decision.  Thank you

## 2012-04-17 NOTE — Telephone Encounter (Signed)
Niccole/needs addressed

## 2012-04-17 NOTE — Telephone Encounter (Signed)
Spoke with pt rgd msg informed [facility] and EMBX is first step before ablation advised pt to call 1st day of cycle to schd pt wants rx for pain during cycles advised pt will consult with provider and call her back pt voice understanding

## 2012-04-18 NOTE — Telephone Encounter (Signed)
Spoke with pt rgd previous msg informed per [place] need appt in order to get rx for pain pt declined appt

## 2012-07-14 ENCOUNTER — Encounter (HOSPITAL_COMMUNITY): Payer: Self-pay | Admitting: Emergency Medicine

## 2012-07-14 ENCOUNTER — Emergency Department (HOSPITAL_COMMUNITY)
Admission: EM | Admit: 2012-07-14 | Discharge: 2012-07-14 | Disposition: A | Discharge status: Home / Self Care | Payer: Medicaid Other | Attending: Emergency Medicine | Admitting: Emergency Medicine

## 2012-07-14 DIAGNOSIS — R21 Rash and other nonspecific skin eruption: Secondary | ICD-10-CM | POA: Insufficient documentation

## 2012-07-14 DIAGNOSIS — K130 Diseases of lips: Secondary | ICD-10-CM | POA: Insufficient documentation

## 2012-07-14 DIAGNOSIS — L0291 Cutaneous abscess, unspecified: Secondary | ICD-10-CM

## 2012-07-14 DIAGNOSIS — D259 Leiomyoma of uterus, unspecified: Secondary | ICD-10-CM | POA: Insufficient documentation

## 2012-07-14 MED ORDER — SULFAMETHOXAZOLE-TRIMETHOPRIM 800-160 MG PO TABS: 1.0000 | ORAL_TABLET | Freq: Two times a day (BID) | ORAL | Status: DC

## 2012-07-14 MED ORDER — LIDOCAINE HCL 2 % IJ SOLN: 10.0000 mL | Freq: Once | INTRAMUSCULAR | Status: DC

## 2012-07-14 MED ORDER — TRAMADOL HCL 50 MG PO TABS: 50.0000 mg | ORAL_TABLET | Freq: Four times a day (QID) | ORAL | Status: DC | PRN

## 2012-07-14 NOTE — ED Notes (Signed)
Pt c/o swelling to top lip onset 2 days ago. Pt reports started out small and increased this morning upon awakening. Pt reports had a dressing that her mom made for Thanksgiving that she never had before. Pt does not take any types of BP medications. Pt also c/o sore throat with shortness of breath. No swelling noted to tongue. Pt talking in complete sentences.

## 2012-07-14 NOTE — ED Provider Notes (Signed)
Medical screening examination/treatment/procedure(s) were performed by non-physician practitioner and as supervising physician I was immediately available for consultation/collaboration.   Rolan Bucco, MD 07/14/12 [phone]

## 2012-07-14 NOTE — ED Provider Notes (Signed)
History     CSN: [id number]  Arrival date & time 07/14/12  0803   First MD Initiated Contact with Patient 07/14/12 [phone]      Chief Complaint  Patient presents with  . Oral Swelling    upper lip    (Consider location/radiation/quality/duration/timing/severity/associated sxs/prior treatment) HPI  Anne Reese is a 36 y.o. female complaining of swelling to center of upper lip worsening over the course of 2 days. Denies new exposure however patient had dinner and having dinner at her mother's house. She denies fever, tongue swelling, shortness of breath, rash/hives. Patient also reports that she has fibroids and recently started her menstruation she is requesting pain medication.   Past Medical History  Diagnosis Date  . Fibroids 2013    Past Surgical History  Procedure Date  . Tubal ligation     Family History  Problem Relation Age of Onset  . Hypertension Maternal Grandfather   . Hypertension Mother     History  Substance Use Topics  . Smoking status: Never Smoker   . Smokeless tobacco: Never Used  . Alcohol Use: No    OB History    Grav Para Term Preterm Abortions TAB SAB Ect Mult Living   3         3      Review of Systems  Constitutional: Negative for fever.  Respiratory: Negative for shortness of breath.   Cardiovascular: Negative for chest pain.  Gastrointestinal: Negative for nausea, vomiting, abdominal pain and diarrhea.  Skin: Positive for rash.  All other systems reviewed and are negative.    Allergies  Review of patient's allergies indicates no known allergies.  Home Medications  No current outpatient prescriptions on file.  BP 132/79  Pulse 105  Temp 98.7 F (37.1 C) (Oral)  Resp 18  SpO2 98%  LMP 07/14/2012  Physical Exam  Nursing note and vitals reviewed. Constitutional: She is oriented to person, place, and time. She appears well-developed and well-nourished. No distress.  HENT:  Head: Normocephalic.  Right Ear: External ear  normal.  Mouth/Throat: Oropharynx is clear and moist.         No tongue swelling  Eyes: Conjunctivae normal and EOM are normal.  Neck:       Shotty bilateral anterior cervical lymphadenopathy.  Cardiovascular: Normal rate.   Pulmonary/Chest: Effort normal and breath sounds normal. No stridor. No respiratory distress. She has no wheezes. She has no rales. She exhibits no tenderness.       Good air movement in all fields no wheezing or stridor.  Abdominal: Soft.  Musculoskeletal: Normal range of motion.  Lymphadenopathy:    She has cervical adenopathy.  Neurological: She is alert and oriented to person, place, and time.  Psychiatric: She has a normal mood and affect.    ED Course  Procedures (including critical care time)  INCISION AND DRAINAGE Performed by: Wynetta Emery Consent: Verbal consent obtained. Risks and benefits: risks, benefits and alternatives were discussed Type: abscess  Body area: Upper lip  Anesthesia: local infiltration  Incision was made with a scalpel.  Local anesthetic: lidocaine 2% withoutepinephrine  Anesthetic total: 1 ml  Complexity: complex Blunt dissection to break up loculations  Drainage: purulent  Drainage amount: Scant   Packing material: None   Patient tolerance: Patient tolerated the procedure well with no immediate complications.    Labs Reviewed - No data to display No results found.   1. Abscess       MDM  Incision and drainage  with scant amount of purulent drainage.   Pt verbalized understanding and agrees with care plan. Outpatient follow-up and return precautions given.     New Prescriptions   SULFAMETHOXAZOLE-TRIMETHOPRIM (SEPTRA DS) 800-160 MG PER TABLET    Take 1 tablet by mouth every 12 (twelve) hours.   TRAMADOL (ULTRAM) 50 MG TABLET    Take 1 tablet (50 mg total) by mouth every 6 (six) hours as needed for pain.          Wynetta Emery, PA-C 07/14/12 [phone]

## 2012-11-23 ENCOUNTER — Emergency Department (HOSPITAL_COMMUNITY)
Admission: EM | Admit: 2012-11-23 | Discharge: 2012-11-23 | Disposition: A | Discharge status: Home / Self Care | Payer: Medicaid Other | Attending: Emergency Medicine | Admitting: Emergency Medicine

## 2012-11-23 ENCOUNTER — Encounter (HOSPITAL_COMMUNITY): Payer: Self-pay | Admitting: *Deleted

## 2012-11-23 DIAGNOSIS — R234 Changes in skin texture: Secondary | ICD-10-CM

## 2012-11-23 DIAGNOSIS — M7989 Other specified soft tissue disorders: Secondary | ICD-10-CM | POA: Insufficient documentation

## 2012-11-23 DIAGNOSIS — Z8742 Personal history of other diseases of the female genital tract: Secondary | ICD-10-CM | POA: Insufficient documentation

## 2012-11-23 DIAGNOSIS — M79609 Pain in unspecified limb: Secondary | ICD-10-CM | POA: Insufficient documentation

## 2012-11-23 DIAGNOSIS — L988 Other specified disorders of the skin and subcutaneous tissue: Secondary | ICD-10-CM | POA: Insufficient documentation

## 2012-11-23 LAB — POCT I-STAT, CHEM 8
Creatinine, Ser: 0.8 mg/dL (ref 0.50–1.10)
Glucose, Bld: 98 mg/dL (ref 70–99)
Hemoglobin: 12.6 g/dL (ref 12.0–15.0)
Potassium: 3.9 mEq/L (ref 3.5–5.1)
TCO2: 26 mmol/L (ref 0–100)

## 2012-11-23 NOTE — ED Notes (Signed)
Pt developed sore on her right, medial heel around a month and a half ago.  Pt denies knowing how the sore developed.  Pt states the pain has worsened and it just won't heal.

## 2012-11-23 NOTE — ED Provider Notes (Signed)
History     CSN: [id number]  Arrival date & time 11/23/12  4098   First MD Initiated Contact with Patient 11/23/12 0454      Chief Complaint  Patient presents with  . Sore    (Consider location/radiation/quality/duration/timing/severity/associated sxs/prior treatment) HPI History provided by patient. Heel sore R side x 4 weeks, on and off, skin cracks and causes a stinging pain is moderate in severity and worse with ambulating and swelling, using lotion with minimal relief. Has seen her primary care physician and was started on antibiotics. Those did not seem to help. She tried to see a podiatrist and was told she needed a primary care referral. No fevers or chills. No wound drainage. No surrounding redness or swelling. Past Medical History  Diagnosis Date  . Fibroids 2013    Past Surgical History  Procedure Laterality Date  . Tubal ligation      Family History  Problem Relation Age of Onset  . Hypertension Maternal Grandfather   . Hypertension Mother     History  Substance Use Topics  . Smoking status: Never Smoker   . Smokeless tobacco: Never Used  . Alcohol Use: No    OB History   Grav Para Term Preterm Abortions TAB SAB Ect Mult Living   3         3      Review of Systems  Constitutional: Negative for fever and chills.  HENT: Negative for neck pain and neck stiffness.   Eyes: Negative for pain.  Respiratory: Negative for shortness of breath.   Cardiovascular: Negative for chest pain.  Gastrointestinal: Negative for abdominal pain.  Genitourinary: Negative for dysuria.  Musculoskeletal: Negative for back pain.  Skin: Positive for wound. Negative for rash.  Neurological: Negative for headaches.  All other systems reviewed and are negative.    Allergies  Review of patient's allergies indicates no known allergies.  Home Medications  No current outpatient prescriptions on file.  BP 124/77  Pulse 89  Temp(Src) 97.9 F (36.6 C) (Oral)  Resp 18   SpO2 95%  LMP 11/17/2012  Physical Exam  Constitutional: She is oriented to person, place, and time. She appears well-developed and well-nourished.  HENT:  Head: Normocephalic and atraumatic.  Eyes: EOM are normal. Pupils are equal, round, and reactive to light.  Neck: Neck supple.  Cardiovascular: Normal rate, regular rhythm and intact distal pulses.   Pulmonary/Chest: Effort normal and breath sounds normal. No respiratory distress.  Musculoskeletal: Normal range of motion. She exhibits no edema.  Right heel with dry cracked skin in an area of approximately 2 cm that is open and deep without any. The drainage, surrounding erythema or swelling. No ulcer. Distal neurovascular intact. No proximal streaking lymphangitis or lymphadenopathy  Neurological: She is alert and oriented to person, place, and time.  Skin: Skin is warm and dry.    ED Course  Procedures (including critical care time)  Results for orders placed during the hospital encounter of 11/23/12  POCT I-STAT, CHEM 8      Result Value Range   Sodium 141  135 - 145 mEq/L   Potassium 3.9  3.5 - 5.1 mEq/L   Chloride 104  96 - 112 mEq/L   BUN 13  6 - 23 mg/dL   Creatinine, Ser 1.19  0.50 - 1.10 mg/dL   Glucose, Bld 98  70 - 99 mg/dL   Calcium, Ion 1.47  8.29 - 1.23 mmol/L   TCO2 26  0 - 100 mmol/L  Hemoglobin 12.6  12.0 - 15.0 g/dL   HCT 24.4  01.0 - 27.2 %   Wound care provided and dressing placed. Sterile bacitracin and nonadhesive dressing with Kerlix gauze and supplies provided. Recommended patient see a podiatrist, cont moisturizers and stay off of her feet when not working   MDM  R heel open sore with dry cracked feet  Wound care instructions and precautions provided  Vital signs and nursing notes reviewed. Labs reviewed. Normal blood sugar.      Sunnie Nielsen, MD 11/23/12 [phone]

## 2012-12-08 ENCOUNTER — Encounter (HOSPITAL_COMMUNITY): Payer: Self-pay | Admitting: Emergency Medicine

## 2012-12-08 ENCOUNTER — Emergency Department (HOSPITAL_COMMUNITY)
Admission: EM | Admit: 2012-12-08 | Discharge: 2012-12-08 | Disposition: A | Discharge status: Home / Self Care | Payer: Medicaid Other | Source: Home / Self Care

## 2012-12-08 DIAGNOSIS — L853 Xerosis cutis: Secondary | ICD-10-CM

## 2012-12-08 DIAGNOSIS — L738 Other specified follicular disorders: Secondary | ICD-10-CM

## 2012-12-08 MED ORDER — AMMONIUM LACTATE 12 % EX CREA: TOPICAL_CREAM | CUTANEOUS | Status: DC | PRN

## 2012-12-08 MED ORDER — DICLOFENAC SODIUM 75 MG PO TBEC: 75.0000 mg | DELAYED_RELEASE_TABLET | Freq: Two times a day (BID) | ORAL | Status: DC

## 2012-12-08 NOTE — ED Provider Notes (Signed)
History     CSN: [id number]  Arrival date & time 12/08/12  1332   None     Chief Complaint  Patient presents with  . Claudication    severe dry cracked right heal. x over a month. will not heal. pt states that she is constantly on her feet at work.     (Consider location/radiation/quality/duration/timing/severity/associated sxs/prior treatment) HPI Comments: 37 year old female presents with dry heels with callus formation and cracking of these in for several weeks. The problem is primarily with the left foot she has been to her primary care doctor, the emergency department and now the urgent care. She has been prescribed him all he has another topical agents without resolution. She states that the pain is better when off of her feet for a few days. Denies bleeding, drainage or red streaks. No history of diabetes.   Past Medical History  Diagnosis Date  . Fibroids 2013    Past Surgical History  Procedure Laterality Date  . Tubal ligation      Family History  Problem Relation Age of Onset  . Hypertension Maternal Grandfather   . Hypertension Mother     History  Substance Use Topics  . Smoking status: Never Smoker   . Smokeless tobacco: Never Used  . Alcohol Use: No    OB History   Grav Para Term Preterm Abortions TAB SAB Ect Mult Living   3         3      Review of Systems  Constitutional: Negative.   Respiratory: Negative.   Skin:       As per history of present illness  Neurological: Negative.     Allergies  Review of patient's allergies indicates no known allergies.  Home Medications   Current Outpatient Rx  Name  Route  Sig  Dispense  Refill  . ammonium lactate (LAC-HYDRIN) 12 % cream   Topical   Apply topically as needed for dry skin.   385 g   0   . diclofenac (VOLTAREN) 75 MG EC tablet   Oral   Take 1 tablet (75 mg total) by mouth 2 (two) times daily. Take with food   20 tablet   0     BP 131/87  Pulse 75  Temp(Src) 98.1 F (36.7  C) (Oral)  Resp 16  SpO2 97%  LMP 11/17/2012  Physical Exam  Nursing note and vitals reviewed. Constitutional: She is oriented to person, place, and time. She appears well-developed and well-nourished. No distress.  Neck: Normal range of motion. Neck supple.  Pulmonary/Chest: Effort normal.  Musculoskeletal: She exhibits no edema.  Neurological: She is alert and oriented to person, place, and time. She exhibits normal muscle tone.  Skin: Skin is warm and dry. No rash noted.  Left foot with hard callus formation of the plantar aspect of the heel and dry skin with a 2 and half centimeter fissure to the posterior aspect of the heel in the Achilles tendon. There is tenderness of these areas. No drainage, erythema or signs of infection.    ED Course  Procedures (including critical care time)  Labs Reviewed - No data to display No results found.   1. Dry skin       MDM  Lac-Hydrin as directed Diclofenac 75 mg twice a day with food when necessary pain Call your doctor for referral to podiatrist.        Hayden Rasmussen, NP 12/08/12 1556

## 2012-12-08 NOTE — ED Provider Notes (Signed)
Medical screening examination/treatment/procedure(s) were performed by resident physician or non-physician practitioner and as supervising physician I was immediately available for consultation/collaboration.   Norina Cowper DOUGLAS MD.   Minola Guin D Caz Weaver, MD 12/08/12 1810 

## 2012-12-08 NOTE — ED Notes (Signed)
Pt c/o severe right dry cracked heel that will no heal. Pt has used antibiotic ointment with no relief.  Pt states that she works on her feet and is having a lot of pain.Marland Kitchen

## 2013-06-09 ENCOUNTER — Other Ambulatory Visit: Payer: Self-pay | Admitting: Obstetrics and Gynecology

## 2013-06-09 ENCOUNTER — Encounter (HOSPITAL_COMMUNITY): Payer: Self-pay | Admitting: Pharmacist

## 2013-06-17 ENCOUNTER — Encounter (HOSPITAL_COMMUNITY)
Admission: RE | Admit: 2013-06-17 | Discharge: 2013-06-17 | Disposition: A | Discharge status: Home / Self Care | Payer: Medicaid Other | Source: Ambulatory Visit | Attending: Obstetrics and Gynecology | Admitting: Obstetrics and Gynecology

## 2013-06-17 ENCOUNTER — Encounter (INDEPENDENT_AMBULATORY_CARE_PROVIDER_SITE_OTHER): Payer: Self-pay

## 2013-06-17 ENCOUNTER — Encounter (HOSPITAL_COMMUNITY): Payer: Self-pay

## 2013-06-17 DIAGNOSIS — Z01818 Encounter for other preprocedural examination: Secondary | ICD-10-CM | POA: Insufficient documentation

## 2013-06-17 DIAGNOSIS — Z01812 Encounter for preprocedural laboratory examination: Secondary | ICD-10-CM | POA: Insufficient documentation

## 2013-06-17 HISTORY — DX: Other complications of anesthesia, initial encounter: T88.59XA

## 2013-06-17 HISTORY — DX: Adverse effect of unspecified anesthetic, initial encounter: T41.45XA

## 2013-06-17 HISTORY — DX: Unspecified asthma, uncomplicated: J45.909

## 2013-06-17 LAB — CBC
Hemoglobin: 11.1 g/dL — ABNORMAL LOW (ref 12.0–15.0)
MCH: 22.6 pg — ABNORMAL LOW (ref 26.0–34.0)
MCHC: 32.3 g/dL (ref 30.0–36.0)

## 2013-06-17 LAB — COMPREHENSIVE METABOLIC PANEL
AST: 10 U/L (ref 0–37)
Albumin: 3.3 g/dL — ABNORMAL LOW (ref 3.5–5.2)
Alkaline Phosphatase: 74 U/L (ref 39–117)
Creatinine, Ser: 0.71 mg/dL (ref 0.50–1.10)
GFR calc non Af Amer: >90 mL/min (ref >90)
Sodium: 136 mEq/L (ref 135–145)
Total Bilirubin: 0.5 mg/dL (ref 0.3–1.2)

## 2013-06-17 NOTE — Patient Instructions (Signed)
Your procedure is scheduled on:06/24/13  Enter through the Main Entrance at :0830 am Pick up desk phone and dial [phone] and inform us of your arrival.  Please call [phone] if you have any problems the morning of surgery.  Remember: Do not eat food or drink liquids, including water, after midnight:Monday   You may brush your teeth the morning of surgery.  DO NOT wear jewelry, eye make-up, lipstick,body lotion, or dark fingernail polish.  (Polished toes are ok) You may wear deodorant.  If you are to be admitted after surgery, leave suitcase in car until your room has been assigned. Patients discharged on the day of surgery will not be allowed to drive home. Wear loose fitting, comfortable clothes for your ride home.

## 2013-06-18 ENCOUNTER — Other Ambulatory Visit: Payer: Self-pay | Admitting: Obstetrics and Gynecology

## 2013-06-18 NOTE — H&P (Signed)
Anne Reese is a 37 y.o. female  P 3-0-0-3  who is S/P Tubal Sterilization, presents for hysterectomy because of menorrhagia, dysmenorrhea and symptomatic uterine fibroids.  Over the past year the patient reports long and heavy menses lasting from 5-10 days and requiring her to change a tampon with 3 pads 5 times a day.  There have  been times that she couldn't walk due to the pain associated with this bleeding and actually lost her job because of the debilitating nature of her monthly period. She rates her cramping and associated menstrual pain at a 10/10 on a 10 point pain scale and has not been able to get any relief from over the counter analgesia.  She denies inter-menstrual bleeding for dysuria but admits to low back pain, pressure with urination and "sluggish" bowel function.   Over the past year the patient was seen in the Emergency Department because of her symptoms and a pelvic ultrasound showed a retroflexed uterus: 10.9 x 6.8 x 7.0 cm with a posterior right fundal fibroid 2.0 x 3.0 x 2.2 cm, left posterior fundal fibroid 3.7 x 3.4 x 2.8 cm and an endometrium 6.3 mm;  both ovaries appeared normal and there were no adnexal masses.  Her TSH in September 2014 was normal as was her CBC except that her hemoglobin was 11.4  A review of both medical and surgical management options for presenting symptoms and fibroids were given to the patient however she desires definitive therapy in the form of hysterectomy.  Past Medical History  OB History: G: 3; P 3-0-0-3;  SVB, 1994 ,1996 and 1998 with largest infant weighing 7 lbs. 13 ounces.  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   ROS: Does not wear glasses/contact lenses and denies headache, vision changes, nasal congestion, dysphagia, tinnitus, dizziness, hoarseness, cough,  chest pain, shortness of breath, nausea, vomiting, diarrhea,constipation,  urinary frequency, urgency  dysuria, hematuria, vaginitis symptoms, pelvic pain, swelling of joints,easy bruising,  myalgias, arthralgias, skin rashes, unexplained weight loss and except as is mentioned in the history of present illness, patient's review of systems is otherwise negative.   Physical Exam  BP: 100/60     P: 100   R: 14    Temperature: 98.0 degrees F orally    Weight:  254 lbs.  Height: 5 ' 3 "   BMI:  45  Neck: supple without masses or thyromegaly Lungs: clear to auscultation Heart: regular rate and rhythm Abdomen: soft, non-tender and no organomegaly Pelvic:EGBUS- wnl; vagina-normal rugae; uterus-enlarged,  ? 10-12 weeks size (exam limited by habitus)  cervix without lesions or motion tenderness; adnexae-no tenderness or masses Extremities:  no clubbing, cyanosis or edema   Assesment:  Menorrhagia                       Dysmenorrhea                         Symptomaitc Uterine Fibroids   Disposition:  A discussion was held with patient regarding the indication for her procedure(s) along with the risks, which include but are not limited to: reaction to anesthesia, damage to adjacent organs, infection and excessive bleeding.  The patient was given a Miralax Bowel Prep to be completed the day before her surgery. The patient verbalized understanding of these risks and pre-operative instructions and has  consented to proceed with a Total Laparoscopic Hysterectomy with Bilateral Salpingectomy, possible Laparoscopically Assisted Vaginal Hysterectomy or possible Total Abdominal Hysterectomy at [facility] , June 24, 2013 at 9:45 a.m.   CSN# [id number]    Edilberto Roosevelt J. Eugune Sine, PA-C  for Dr. Naima A. Dillard   

## 2013-06-24 ENCOUNTER — Ambulatory Visit (HOSPITAL_COMMUNITY): Payer: Medicaid Other | Admitting: Anesthesiology

## 2013-06-24 ENCOUNTER — Encounter (HOSPITAL_COMMUNITY): Payer: Medicaid Other | Admitting: Anesthesiology

## 2013-06-24 ENCOUNTER — Encounter (HOSPITAL_COMMUNITY)
Admission: RE | Disposition: A | Discharge status: Home / Self Care | Payer: Self-pay | Source: Ambulatory Visit | Attending: Obstetrics and Gynecology

## 2013-06-24 ENCOUNTER — Encounter (HOSPITAL_COMMUNITY): Payer: Self-pay | Admitting: Anesthesiology

## 2013-06-24 ENCOUNTER — Inpatient Hospital Stay (HOSPITAL_COMMUNITY)
Admission: RE | Admit: 2013-06-24 | Discharge: 2013-06-26 | DRG: 743 | Disposition: A | Discharge status: Home / Self Care | Payer: Medicaid Other | Source: Ambulatory Visit | Attending: Obstetrics and Gynecology | Admitting: Obstetrics and Gynecology

## 2013-06-24 DIAGNOSIS — N854 Malposition of uterus: Secondary | ICD-10-CM | POA: Diagnosis present

## 2013-06-24 DIAGNOSIS — N920 Excessive and frequent menstruation with regular cycle: Principal | ICD-10-CM | POA: Diagnosis present

## 2013-06-24 DIAGNOSIS — N946 Dysmenorrhea, unspecified: Secondary | ICD-10-CM | POA: Diagnosis present

## 2013-06-24 DIAGNOSIS — Z87891 Personal history of nicotine dependence: Secondary | ICD-10-CM | POA: Diagnosis not present

## 2013-06-24 DIAGNOSIS — D251 Intramural leiomyoma of uterus: Secondary | ICD-10-CM | POA: Diagnosis present

## 2013-06-24 DIAGNOSIS — Z90710 Acquired absence of both cervix and uterus: Secondary | ICD-10-CM

## 2013-06-24 DIAGNOSIS — D649 Anemia, unspecified: Secondary | ICD-10-CM | POA: Diagnosis present

## 2013-06-24 HISTORY — PX: BILATERAL SALPINGECTOMY: SHX5743

## 2013-06-24 HISTORY — PX: DILATION AND CURETTAGE OF UTERUS: SHX78

## 2013-06-24 HISTORY — PX: ABDOMINAL HYSTERECTOMY: SHX81

## 2013-06-24 HISTORY — PX: CYSTOSCOPY: SHX5120

## 2013-06-24 SURGERY — HYSTERECTOMY, ABDOMINAL
Anesthesia: General | Site: Vagina | Wound class: Clean

## 2013-06-24 MED ORDER — IBUPROFEN 600 MG PO TABS
600.0000 mg | ORAL_TABLET | Freq: Four times a day (QID) | ORAL | Status: DC | PRN
  Administered 2013-06-25: 600 mg via ORAL
  Filled 2013-06-24: qty 1

## 2013-06-24 MED ORDER — LIDOCAINE HCL (CARDIAC) 20 MG/ML IV SOLN
INTRAVENOUS | Status: DC | PRN
  Administered 2013-06-24: 80 mg via INTRAVENOUS

## 2013-06-24 MED ORDER — HYDROCODONE-ACETAMINOPHEN 5-325 MG PO TABS
1.0000 | ORAL_TABLET | ORAL | Status: DC | PRN
  Administered 2013-06-25: 2 via ORAL
  Administered 2013-06-25: 1 via ORAL
  Administered 2013-06-25: 2 via ORAL
  Administered 2013-06-25: 1 via ORAL
  Administered 2013-06-25 – 2013-06-26 (×2): 2 via ORAL
  Filled 2013-06-24: qty 2
  Filled 2013-06-24 (×2): qty 1
  Filled 2013-06-24 (×4): qty 2

## 2013-06-24 MED ORDER — KETOROLAC TROMETHAMINE 30 MG/ML IJ SOLN
30.0000 mg | Freq: Four times a day (QID) | INTRAMUSCULAR | Status: DC
  Administered 2013-06-24 (×2): 30 mg via INTRAVENOUS
  Filled 2013-06-24 (×3): qty 1

## 2013-06-24 MED ORDER — MIDAZOLAM HCL 2 MG/2ML IJ SOLN
INTRAMUSCULAR | Status: DC | PRN
  Administered 2013-06-24: 1.5 mg via INTRAVENOUS
  Administered 2013-06-24: 0.5 mg via INTRAVENOUS

## 2013-06-24 MED ORDER — MEPERIDINE HCL 25 MG/ML IJ SOLN
INTRAMUSCULAR | Status: AC
  Administered 2013-06-24: 12.5 mg via INTRAVENOUS
  Filled 2013-06-24: qty 1

## 2013-06-24 MED ORDER — ONDANSETRON HCL 4 MG/2ML IJ SOLN: 4.0000 mg | Freq: Four times a day (QID) | INTRAMUSCULAR | Status: DC | PRN

## 2013-06-24 MED ORDER — MENTHOL 3 MG MT LOZG: 1.0000 | LOZENGE | OROMUCOSAL | Status: DC | PRN

## 2013-06-24 MED ORDER — VASOPRESSIN 20 UNIT/ML IJ SOLN
INTRAMUSCULAR | Status: AC
  Filled 2013-06-24: qty 1

## 2013-06-24 MED ORDER — HYDROMORPHONE HCL PF 1 MG/ML IJ SOLN
INTRAMUSCULAR | Status: DC | PRN
  Administered 2013-06-24: 1 mg via INTRAVENOUS

## 2013-06-24 MED ORDER — METOCLOPRAMIDE HCL 5 MG/ML IJ SOLN: 10.0000 mg | Freq: Once | INTRAMUSCULAR | Status: DC | PRN

## 2013-06-24 MED ORDER — KETOROLAC TROMETHAMINE 30 MG/ML IJ SOLN
INTRAMUSCULAR | Status: DC | PRN
  Administered 2013-06-24: 30 mg via INTRAVENOUS

## 2013-06-24 MED ORDER — SUFENTANIL CITRATE 50 MCG/ML IV SOLN
INTRAVENOUS | Status: AC
  Filled 2013-06-24: qty 1

## 2013-06-24 MED ORDER — MIDAZOLAM HCL 2 MG/2ML IJ SOLN
INTRAMUSCULAR | Status: AC
  Filled 2013-06-24: qty 2

## 2013-06-24 MED ORDER — SODIUM CHLORIDE 0.9 % IJ SOLN: 9.0000 mL | INTRAMUSCULAR | Status: DC | PRN

## 2013-06-24 MED ORDER — HYDROMORPHONE HCL PF 1 MG/ML IJ SOLN
0.2500 mg | INTRAMUSCULAR | Status: DC | PRN
  Administered 2013-06-24 (×2): 0.5 mg via INTRAVENOUS

## 2013-06-24 MED ORDER — ONDANSETRON HCL 4 MG/2ML IJ SOLN
INTRAMUSCULAR | Status: DC | PRN
  Administered 2013-06-24 (×2): 2 mg via INTRAVENOUS

## 2013-06-24 MED ORDER — DEXAMETHASONE SODIUM PHOSPHATE 10 MG/ML IJ SOLN
INTRAMUSCULAR | Status: AC
  Filled 2013-06-24: qty 1

## 2013-06-24 MED ORDER — GLYCOPYRROLATE 0.2 MG/ML IJ SOLN
INTRAMUSCULAR | Status: AC
  Filled 2013-06-24: qty 3

## 2013-06-24 MED ORDER — SODIUM CHLORIDE 0.9 % IR SOLN
Status: DC | PRN
  Administered 2013-06-24: 3000 mL

## 2013-06-24 MED ORDER — KETOROLAC TROMETHAMINE 30 MG/ML IJ SOLN
30.0000 mg | Freq: Four times a day (QID) | INTRAMUSCULAR | Status: DC
  Administered 2013-06-25: 30 mg via INTRAMUSCULAR

## 2013-06-24 MED ORDER — LACTATED RINGERS IV SOLN
INTRAVENOUS | Status: DC
  Administered 2013-06-24 (×2): via INTRAVENOUS

## 2013-06-24 MED ORDER — ROCURONIUM BROMIDE 100 MG/10ML IV SOLN
INTRAVENOUS | Status: DC | PRN
  Administered 2013-06-24: 50 mg via INTRAVENOUS
  Administered 2013-06-24 (×2): 10 mg via INTRAVENOUS

## 2013-06-24 MED ORDER — PROPOFOL 10 MG/ML IV EMUL
INTRAVENOUS | Status: AC
  Filled 2013-06-24: qty 20

## 2013-06-24 MED ORDER — DEXAMETHASONE SODIUM PHOSPHATE 10 MG/ML IJ SOLN
INTRAMUSCULAR | Status: DC | PRN
  Administered 2013-06-24: 10 mg via INTRAVENOUS

## 2013-06-24 MED ORDER — CEFAZOLIN SODIUM-DEXTROSE 2-3 GM-% IV SOLR
INTRAVENOUS | Status: AC
  Filled 2013-06-24: qty 50

## 2013-06-24 MED ORDER — DIPHENHYDRAMINE HCL 12.5 MG/5ML PO ELIX
12.5000 mg | ORAL_SOLUTION | Freq: Four times a day (QID) | ORAL | Status: DC | PRN
  Filled 2013-06-24: qty 5

## 2013-06-24 MED ORDER — DIPHENHYDRAMINE HCL 50 MG/ML IJ SOLN: 12.5000 mg | Freq: Four times a day (QID) | INTRAMUSCULAR | Status: DC | PRN

## 2013-06-24 MED ORDER — PROPOFOL 10 MG/ML IV BOLUS
INTRAVENOUS | Status: DC | PRN
  Administered 2013-06-24: 180 mg via INTRAVENOUS

## 2013-06-24 MED ORDER — HYDROMORPHONE 0.3 MG/ML IV SOLN
INTRAVENOUS | Status: DC
  Administered 2013-06-24: 0.799 mg via INTRAVENOUS
  Administered 2013-06-24: 15:00:00 via INTRAVENOUS
  Administered 2013-06-24 (×2): 2.59 mg via INTRAVENOUS
  Filled 2013-06-24: qty 25

## 2013-06-24 MED ORDER — LACTATED RINGERS IV SOLN
INTRAVENOUS | Status: DC
  Administered 2013-06-24 (×3): via INTRAVENOUS

## 2013-06-24 MED ORDER — HYDROMORPHONE HCL PF 1 MG/ML IJ SOLN
INTRAMUSCULAR | Status: AC
  Filled 2013-06-24: qty 1

## 2013-06-24 MED ORDER — NALOXONE HCL 0.4 MG/ML IJ SOLN: 0.4000 mg | INTRAMUSCULAR | Status: DC | PRN

## 2013-06-24 MED ORDER — HEPARIN SODIUM (PORCINE) 5000 UNIT/ML IJ SOLN
INTRAMUSCULAR | Status: AC
  Filled 2013-06-24: qty 1

## 2013-06-24 MED ORDER — ZOLPIDEM TARTRATE 5 MG PO TABS: 5.0000 mg | ORAL_TABLET | Freq: Every evening | ORAL | Status: DC | PRN

## 2013-06-24 MED ORDER — LIDOCAINE HCL (CARDIAC) 20 MG/ML IV SOLN
INTRAVENOUS | Status: AC
  Filled 2013-06-24: qty 5

## 2013-06-24 MED ORDER — KETOROLAC TROMETHAMINE 30 MG/ML IJ SOLN
INTRAMUSCULAR | Status: AC
  Filled 2013-06-24: qty 1

## 2013-06-24 MED ORDER — BUPIVACAINE HCL (PF) 0.25 % IJ SOLN
INTRAMUSCULAR | Status: AC
  Filled 2013-06-24: qty 30

## 2013-06-24 MED ORDER — SUFENTANIL CITRATE 50 MCG/ML IV SOLN
INTRAVENOUS | Status: DC | PRN
  Administered 2013-06-24: 10 ug via INTRAVENOUS
  Administered 2013-06-24: 5 ug via INTRAVENOUS
  Administered 2013-06-24: 10 ug via INTRAVENOUS
  Administered 2013-06-24: 5 ug via INTRAVENOUS
  Administered 2013-06-24 (×2): 10 ug via INTRAVENOUS

## 2013-06-24 MED ORDER — METHYLENE BLUE 1 % INJ SOLN
INTRAMUSCULAR | Status: AC
  Filled 2013-06-24: qty 10

## 2013-06-24 MED ORDER — GLYCOPYRROLATE 0.2 MG/ML IJ SOLN
INTRAMUSCULAR | Status: DC | PRN
  Administered 2013-06-24: 0.4 mg via INTRAVENOUS
  Administered 2013-06-24 (×2): 0.1 mg via INTRAVENOUS

## 2013-06-24 MED ORDER — MEPERIDINE HCL 25 MG/ML IJ SOLN
6.2500 mg | INTRAMUSCULAR | Status: DC | PRN
  Administered 2013-06-24: 12.5 mg via INTRAVENOUS

## 2013-06-24 MED ORDER — SCOPOLAMINE 1 MG/3DAYS TD PT72
MEDICATED_PATCH | TRANSDERMAL | Status: AC
  Filled 2013-06-24: qty 1

## 2013-06-24 MED ORDER — NEOSTIGMINE METHYLSULFATE 1 MG/ML IJ SOLN
INTRAMUSCULAR | Status: DC | PRN
  Administered 2013-06-24: 3 mg via INTRAVENOUS

## 2013-06-24 MED ORDER — CEFAZOLIN SODIUM-DEXTROSE 2-3 GM-% IV SOLR
2.0000 g | INTRAVENOUS | Status: AC
  Administered 2013-06-24: 2 g via INTRAVENOUS

## 2013-06-24 MED ORDER — HYDROMORPHONE HCL PF 1 MG/ML IJ SOLN
INTRAMUSCULAR | Status: AC
  Administered 2013-06-24: 0.5 mg via INTRAVENOUS
  Filled 2013-06-24: qty 1

## 2013-06-24 MED ORDER — NEOSTIGMINE METHYLSULFATE 1 MG/ML IJ SOLN
INTRAMUSCULAR | Status: AC
  Filled 2013-06-24: qty 1

## 2013-06-24 MED ORDER — ONDANSETRON HCL 4 MG/2ML IJ SOLN
INTRAMUSCULAR | Status: AC
  Filled 2013-06-24: qty 2

## 2013-06-24 MED ORDER — ESTRADIOL 0.1 MG/GM VA CREA
TOPICAL_CREAM | VAGINAL | Status: AC
  Filled 2013-06-24: qty 42.5

## 2013-06-24 MED ORDER — SCOPOLAMINE 1 MG/3DAYS TD PT72
1.0000 | MEDICATED_PATCH | TRANSDERMAL | Status: DC
  Administered 2013-06-24: 1.5 mg via TRANSDERMAL
  Administered 2013-06-24: 1 via TRANSDERMAL

## 2013-06-24 SURGICAL SUPPLY — 98 items
ADH SKN CLS APL DERMABOND .7 (GAUZE/BANDAGES/DRESSINGS) ×5
APL SKNCLS STERI-STRIP NONHPOA (GAUZE/BANDAGES/DRESSINGS) ×5
BARRIER ADHS 3X4 INTERCEED (GAUZE/BANDAGES/DRESSINGS) IMPLANT
BENZOIN TINCTURE PRP APPL 2/3 (GAUZE/BANDAGES/DRESSINGS) ×2 IMPLANT
BRR ADH 4X3 ABS CNTRL BYND (GAUZE/BANDAGES/DRESSINGS)
CABLE HIGH FREQUENCY MONO STRZ (ELECTRODE) IMPLANT
CANISTER SUCT 3000ML (MISCELLANEOUS) ×6 IMPLANT
CATH FOLEY 3WAY  5CC 16FR (CATHETERS)
CATH FOLEY 3WAY 5CC 16FR (CATHETERS) IMPLANT
CATH ROBINSON RED A/P 16FR (CATHETERS) IMPLANT
CHLORAPREP W/TINT 26ML (MISCELLANEOUS) ×6 IMPLANT
CLOTH BEACON ORANGE TIMEOUT ST (SAFETY) ×6 IMPLANT
CONT PATH 16OZ SNAP LID 3702 (MISCELLANEOUS) ×6 IMPLANT
COVER MAYO STAND STRL (DRAPES) ×6 IMPLANT
COVER TABLE BACK 60X90 (DRAPES) ×6 IMPLANT
DECANTER SPIKE VIAL GLASS SM (MISCELLANEOUS) IMPLANT
DERMABOND ADVANCED (GAUZE/BANDAGES/DRESSINGS) ×1
DERMABOND ADVANCED .7 DNX12 (GAUZE/BANDAGES/DRESSINGS) ×5 IMPLANT
DISSECTOR BLUNT TIP ENDO 5MM (MISCELLANEOUS) IMPLANT
DISSECTOR SPONGE CHERRY (GAUZE/BANDAGES/DRESSINGS) IMPLANT
DRAPE PROXIMA HALF (DRAPES) ×6 IMPLANT
DRAPE WARM FLUID 44X44 (DRAPE) ×2 IMPLANT
DRSG OPSITE POSTOP 4X10 (GAUZE/BANDAGES/DRESSINGS) ×2 IMPLANT
ELECT REM PT RETURN 9FT ADLT (ELECTROSURGICAL)
ELECTRODE REM PT RTRN 9FT ADLT (ELECTROSURGICAL) IMPLANT
EVACUATOR SMOKE 8.L (FILTER) ×8 IMPLANT
FORCEPS CUTTING 33CM 5MM (CUTTING FORCEPS) IMPLANT
GAUZE PACKING 2X5 YD STERILE (GAUZE/BANDAGES/DRESSINGS) IMPLANT
GAUZE SPONGE 4X4 16PLY XRAY LF (GAUZE/BANDAGES/DRESSINGS) ×6 IMPLANT
GAUZE VASELINE 3X9 (GAUZE/BANDAGES/DRESSINGS) IMPLANT
GLOVE BIO SURGEON STRL SZ 6.5 (GLOVE) ×16 IMPLANT
GLOVE BIO SURGEON STRL SZ7.5 (GLOVE) ×6 IMPLANT
GLOVE BIOGEL PI IND STRL 6.5 (GLOVE) ×4 IMPLANT
GLOVE BIOGEL PI IND STRL 7.0 (GLOVE) ×7 IMPLANT
GLOVE BIOGEL PI IND STRL 7.5 (GLOVE) ×9 IMPLANT
GLOVE BIOGEL PI INDICATOR 6.5 (GLOVE)
GLOVE BIOGEL PI INDICATOR 7.0 (GLOVE) ×3
GLOVE BIOGEL PI INDICATOR 7.5 (GLOVE) ×5
GOWN PREVENTION PLUS LG XLONG (DISPOSABLE) ×16 IMPLANT
GOWN STRL REIN XL XLG (GOWN DISPOSABLE) ×20 IMPLANT
HEMOSTAT SURGICEL 2X14 (HEMOSTASIS) IMPLANT
NDL HYPO 25X1 1.5 SAFETY (NEEDLE) IMPLANT
NEEDLE HYPO 25X1 1.5 SAFETY (NEEDLE) IMPLANT
NEEDLE MAYO .5 CIRCLE (NEEDLE) ×4 IMPLANT
NS IRRIG 1000ML POUR BTL (IV SOLUTION) ×6 IMPLANT
OCCLUDER COLPOPNEUMO (BALLOONS) ×4 IMPLANT
PACK LAPAROSCOPY BASIN (CUSTOM PROCEDURE TRAY) ×4 IMPLANT
PACK LAVH (CUSTOM PROCEDURE TRAY) ×6 IMPLANT
PAD ABD 7.5X8 STRL (GAUZE/BANDAGES/DRESSINGS) ×2 IMPLANT
PAD OB MATERNITY 4.3X12.25 (PERSONAL CARE ITEMS) ×6 IMPLANT
PROTECTOR NERVE ULNAR (MISCELLANEOUS) ×8 IMPLANT
SCALPEL HARMONIC ACE (MISCELLANEOUS) IMPLANT
SCISSORS LAP 5X35 DISP (ENDOMECHANICALS) IMPLANT
SET CYSTO W/LG BORE CLAMP LF (SET/KITS/TRAYS/PACK) ×6 IMPLANT
SET IRRIG TUBING LAPAROSCOPIC (IRRIGATION / IRRIGATOR) ×4 IMPLANT
SOLUTION ELECTROLUBE (MISCELLANEOUS) ×6 IMPLANT
SPONGE LAP 18X18 X RAY DECT (DISPOSABLE) ×12 IMPLANT
SPONGE SURGIFOAM ABS GEL 12-7 (HEMOSTASIS) ×4 IMPLANT
STAPLER VISISTAT 35W (STAPLE) IMPLANT
STRIP CLOSURE SKIN 1/2X4 (GAUZE/BANDAGES/DRESSINGS) ×6 IMPLANT
STRIP CLOSURE SKIN 1/4X3 (GAUZE/BANDAGES/DRESSINGS) IMPLANT
STRIP CLOSURE SKIN 1/4X4 (GAUZE/BANDAGES/DRESSINGS) ×2 IMPLANT
SUT CHROMIC 0 CT 1 (SUTURE) ×6 IMPLANT
SUT CHROMIC 0 UR 5 27 (SUTURE) IMPLANT
SUT MNCRL AB 3-0 PS2 27 (SUTURE) ×8 IMPLANT
SUT MON AB 3-0 SH 27 (SUTURE) ×6
SUT MON AB 3-0 SH27 (SUTURE) ×5 IMPLANT
SUT PDS AB 0 CT1 27 (SUTURE) IMPLANT
SUT PDS AB 1 CT1 36 (SUTURE) IMPLANT
SUT PLAIN 2 0 XLH (SUTURE) ×6 IMPLANT
SUT VIC AB 0 CT1 18XCR BRD8 (SUTURE) ×15 IMPLANT
SUT VIC AB 0 CT1 27 (SUTURE) ×24
SUT VIC AB 0 CT1 27XBRD ANBCTR (SUTURE) ×20 IMPLANT
SUT VIC AB 0 CT1 36 (SUTURE) ×4 IMPLANT
SUT VIC AB 0 CT1 8-18 (SUTURE) ×18
SUT VIC AB 2-0 SH 27 (SUTURE) ×6
SUT VIC AB 2-0 SH 27XBRD (SUTURE) ×5 IMPLANT
SUT VICRYL 0 ENDOLOOP (SUTURE) IMPLANT
SUT VICRYL 0 TIES 12 18 (SUTURE) ×6 IMPLANT
SUT VICRYL 0 UR6 27IN ABS (SUTURE) ×8 IMPLANT
SYR 50ML LL SCALE MARK (SYRINGE) ×4 IMPLANT
SYR BULB IRRIGATION 50ML (SYRINGE) ×6 IMPLANT
SYR CONTROL 10ML LL (SYRINGE) IMPLANT
SYR TB 1ML LUER SLIP (SYRINGE) ×6 IMPLANT
TAPE HYPAFIX 4 X10 (GAUZE/BANDAGES/DRESSINGS) ×2 IMPLANT
TIP UTERINE 5.1X6CM LAV DISP (MISCELLANEOUS) IMPLANT
TIP UTERINE 6.7X10CM GRN DISP (MISCELLANEOUS) IMPLANT
TIP UTERINE 6.7X6CM WHT DISP (MISCELLANEOUS) IMPLANT
TIP UTERINE 6.7X8CM BLUE DISP (MISCELLANEOUS) IMPLANT
TOWEL OR 17X24 6PK STRL BLUE (TOWEL DISPOSABLE) ×12 IMPLANT
TRAY FOLEY CATH 14FR (SET/KITS/TRAYS/PACK) ×6 IMPLANT
TROCAR BALLN 12MMX100 BLUNT (TROCAR) ×6 IMPLANT
TROCAR XCEL NON-BLD 11X100MML (ENDOMECHANICALS) ×4 IMPLANT
TROCAR XCEL NON-BLD 5MMX100MML (ENDOMECHANICALS) ×4 IMPLANT
TROCAR XCEL OPT SLVE 5M 100M (ENDOMECHANICALS) ×4 IMPLANT
TUBING FILTER THERMOFLATOR (ELECTROSURGICAL) ×4 IMPLANT
WARMER LAPAROSCOPE (MISCELLANEOUS) ×4 IMPLANT
WATER STERILE IRR 1000ML POUR (IV SOLUTION) ×6 IMPLANT

## 2013-06-24 NOTE — Progress Notes (Signed)
Day of Surgery Procedure(s) (LRB): HYSTERECTOMY ABDOMINAL (N/A) BILATERAL SALPINGECTOMY (Bilateral) DILATATION AND CURETTAGE (N/A) CYSTOSCOPY (N/A)  Subjective: Patient reports tolerating PO.  Tolerating clears. No N/V  Objective: I have reviewed patient's vital signs, intake and output and medications.  General: alert and cooperative Resp: clear to auscultation bilaterally Cardio: regular rate and rhythm GI: soft, non-tender; bowel sounds normal; no masses,  no organomegaly and incision: clean, dry and intact Extremities: SCDS B Vaginal Bleeding: minimal  Assessment: s/p Procedure(s): HYSTERECTOMY ABDOMINAL (N/A) BILATERAL SALPINGECTOMY (Bilateral) DILATATION AND CURETTAGE (N/A) CYSTOSCOPY (N/A): stable and dc foley in the morning.  If no n/v heploc IV and DC PCA in the morning. Give reglan with meals in am  Plan: Advance diet Encourage ambulation  LOS: 0 days    Hedda Crumbley A 06/24/2013, 8:39 PM

## 2013-06-24 NOTE — Anesthesia Procedure Notes (Signed)
Procedure Name: Intubation Date/Time: 06/24/2013 10:09 AM Performed by: Wauneta Silveria ADEDAYO Markey Deady Pre-anesthesia Checklist: Patient identified, Emergency Drugs available, Suction available and Patient being monitored Patient Re-evaluated:Patient Re-evaluated prior to inductionOxygen Delivery Method: Circle system utilized Preoxygenation: Pre-oxygenation with 100% oxygen Intubation Type: IV induction Ventilation: Mask ventilation without difficulty Laryngoscope Size: Miller and 2 Grade View: Grade II Tube type: Oral Tube size: 7.0 mm Number of attempts: 1 Placement Confirmation: ETT inserted through vocal cords under direct vision,  positive ETCO2 and breath sounds checked- equal and bilateral Secured at: 22 cm Tube secured with: Tape Dental Injury: Teeth and Oropharynx as per pre-operative assessment

## 2013-06-24 NOTE — H&P (View-Only) (Signed)
Anne Reese is a 37 y.o. female  P 3-0-0-3  who is S/P Tubal Sterilization, presents for hysterectomy because of menorrhagia, dysmenorrhea and symptomatic uterine fibroids.  Over the past year the patient reports long and heavy menses lasting from 5-10 days and requiring her to change a tampon with 3 pads 5 times a day.  There have  been times that she couldn't walk due to the pain associated with this bleeding and actually lost her job because of the debilitating nature of her monthly period. She rates her cramping and associated menstrual pain at a 10/10 on a 10 point pain scale and has not been able to get any relief from over the counter analgesia.  She denies inter-menstrual bleeding for dysuria but admits to low back pain, pressure with urination and "sluggish" bowel function.   Over the past year the patient was seen in the Emergency Department because of her symptoms and a pelvic ultrasound showed a retroflexed uterus: 10.9 x 6.8 x 7.0 cm with a posterior right fundal fibroid 2.0 x 3.0 x 2.2 cm, left posterior fundal fibroid 3.7 x 3.4 x 2.8 cm and an endometrium 6.3 mm;  both ovaries appeared normal and there were no adnexal masses.  Her TSH in September 2014 was normal as was her CBC except that her hemoglobin was 11.4  A review of both medical and surgical management options for presenting symptoms and fibroids were given to the patient however she desires definitive therapy in the form of hysterectomy.  Past Medical History  OB History: G: 3; P 3-0-0-3;  SVB, 1994 ,1996 and 1998 with largest infant weighing 7 lbs. 13 ounces.  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   ROS: Does not wear glasses/contact lenses and denies headache, vision changes, nasal congestion, dysphagia, tinnitus, dizziness, hoarseness, cough,  chest pain, shortness of breath, nausea, vomiting, diarrhea,constipation,  urinary frequency, urgency  dysuria, hematuria, vaginitis symptoms, pelvic pain, swelling of joints,easy bruising,  myalgias, arthralgias, skin rashes, unexplained weight loss and except as is mentioned in the history of present illness, patient's review of systems is otherwise negative.   Physical Exam  BP: 100/60     P: 100   R: 14    Temperature: 98.0 degrees F orally    Weight:  254 lbs.  Height: 5 ' 3 "   BMI:  45  Neck: supple without masses or thyromegaly Lungs: clear to auscultation Heart: regular rate and rhythm Abdomen: soft, non-tender and no organomegaly Pelvic:EGBUS- wnl; vagina-normal rugae; uterus-enlarged,  ? 10-12 weeks size (exam limited by habitus)  cervix without lesions or motion tenderness; adnexae-no tenderness or masses Extremities:  no clubbing, cyanosis or edema   Assesment:  Menorrhagia                       Dysmenorrhea  Symptomaitc Uterine Fibroids   Disposition:  A discussion was held with patient regarding the indication for her procedure(s) along with the risks, which include but are not limited to: reaction to anesthesia, damage to adjacent organs, infection and excessive bleeding.  The patient was given a Miralax Bowel Prep to be completed the day before her surgery. The patient verbalized understanding of these risks and pre-operative instructions and has  consented to proceed with a Total Laparoscopic Hysterectomy with Bilateral Salpingectomy, possible Laparoscopically Assisted Vaginal Hysterectomy or possible Total Abdominal Hysterectomy at [facility] [place], June 24, 2013 at 9:45 a.m.   CSN# [id number]    Alease Fait J. Lowell Guitar, PA-C  for Dr. Pierre Bali. Dillard

## 2013-06-24 NOTE — Interval H&P Note (Signed)
History and Physical Interval Note: I just spoke with the pt and she desires an abdominal hysterectomy.  She went on [employer] last evening and decided against any laparoscopic procedures.  She understads the risks are but not limited to bleeding, infection and damage to internal organs.  She also declined endometrial bx in the office.  I will do a D&C  Before the hysterectomy.  She understands that if the pathology is abnormal she will need additional surgery in the future.    06/24/2013 9:52 AM  Anne Reese  has presented today for surgery, with the diagnosis of Menorrhagia  The various methods of treatment have been discussed with the patient and family. After consideration of risks, benefits and other options for treatment, the patient has consented to   .  The patient's history has been reviewed, patient examined, no change in status, stable for surgery.  I have reviewed the patient's chart and labs.  Questions were answered to the patient's satisfaction.    [facility] A

## 2013-06-24 NOTE — Op Note (Signed)
Prior to case, during patient interview, Dr Normand Sloop arrived to discuss procedure with patient. Patient expressed her fears of having laparoscopic and especially vaginal procedures after viewing those types of surgeries on YouTube last night. She adamantly refused those procedures and, even when explained the risks vs. benefits by Dr. Normand Sloop, requested ONLY an abdominal procedure. Dr. Normand Sloop explained all the risks and benefits of each procedure, and patient verbalized her understanding. Anne Reese, [place] 06/24/2013 1000am

## 2013-06-24 NOTE — Transfer of Care (Signed)
Immediate Anesthesia Transfer of Care Note  Patient: CATALIA MASSETT  Procedure(s) Performed: Procedure(s): HYSTERECTOMY ABDOMINAL (N/A) BILATERAL SALPINGECTOMY (Bilateral) DILATATION AND CURETTAGE (N/A) CYSTOSCOPY (N/A)  Patient Location: PACU  Anesthesia Type:General  Level of Consciousness: awake, alert  and oriented  Airway & Oxygen Therapy: Patient Spontanous Breathing and Patient connected to nasal cannula oxygen  Post-op Assessment: Report given to PACU RN, Post -op Vital signs reviewed and stable and Patient moving all extremities X 4  Post vital signs: Reviewed and stable  Complications: No apparent anesthesia complications

## 2013-06-24 NOTE — Anesthesia Postprocedure Evaluation (Signed)
  Anesthesia Post-op Note  Patient: Anne Reese  Procedure(s) Performed: Procedure(s): HYSTERECTOMY ABDOMINAL (N/A) BILATERAL SALPINGECTOMY (Bilateral) DILATATION AND CURETTAGE (N/A) CYSTOSCOPY (N/A) Patient is awake and responsive. Pain and nausea are reasonably well controlled. Vital signs are stable and clinically acceptable. Oxygen saturation is clinically acceptable. There are no apparent anesthetic complications at this time. Patient is ready for discharge.

## 2013-06-24 NOTE — Op Note (Signed)
Indications: symptomatic Fibroids Menorrhagia dysmenorrhea  Pre-operative Diagnosis: see above  Post-operative Diagnosis: same  Operation:   Total abdominal hysterectomy.  Bilateral salpingectomy, cystoscopy  Surgeon: [id number],JYNWG A   Assistants: Osborn Coho MD  Anesthesia: General endotracheal anesthesia  ASA Class: per anesthesia  Procedure Details  The patient was seen in the Holding Room. The risks, benefits, complications, treatment options, and expected outcomes were discussed with the patient.  The patient concurred with the proposed plan, giving informed consent.  The site of surgery properly noted/marked. The patient was taken to Operating Room # 4, identified as Anne Reese.  A time out was done.    After induction of anesthesia, the patient was draped and prepped in the usual sterile manner. Pt was placed in supine position after anesthesia and draped and prepped in the usual sterile manner. Foley catheter was placed. Attention was then turned to the vagina.  The weighted retractor was placed in the vagina.  The anterior lip of the cervix was grasped with a single tooth tenaculum.  The cervix was dilated with pratt dilators.  A curretage was done of the uterus to obtain endometrial curretings.  Pt declined to have a EMBX in the office.    . A pfannensteil incision was made with the knife on the skin 2 cm above the symphysis pubis.   Fascial incision was made and extended bilaterally. The rectus muscles were separated. The peritoneum was identified and entered. Peritoneal incision was extended longitudinally.  The above findings were noted. A belfour retractor with the extendor was placed and bowel was packed away from the surgical site.   The round ligaments were identified, cut, and ligated with 0-Vicryl. The anterior peritoneal reflection was incised and the bladder was dissected off the lower uterine segment. . The right utero-ovarian ligament and proximal fallopian  tube were grasped, cut, and suture ligated with 0-Vicryl. The left utero-ovarian ligament and proximal fallopian tube were grasped, cut and suture ligated with 0-Vicryl. Hemostasis was observed. The fallopian tubes were clamped with kelly clamps and removed using metzenbaum scissors.  The mesosalpinx was made hemostatic with 0 vicryl suture.   The uterine vessels were skeletonized, then clamped, cut and suture ligated with 0-Vicryl suture. The fundus was removed using the knife to obtain better visualization.   Serial pedicles of the cardinal and utero-sacral ligaments were clamped, cut, and suture ligated with 0-Vicryl. Entrance was made into the vagina and the uterus removed. Vaginal cuff angle sutures were placed incorporating the utero-sacral ligaments for support. The vaginal cuff was then closed with a running stitch of 0- Vicryl. Lavage was carried out until clear. Hemostasis was observed. There was some oozing from the vaginal cuff which was made hemostatic with figure of 8 o vicryl suture.  Gelfoam was placed along the cuff as well.    Retractor and all packing was removed from the abdomen. The peritoneum was closed with 0 chromic.   The fascia was approximated with running sutures of 0-Vicryl. Lavage was again carried out.  Hemostasis was observed. The subcutaneous tissue was reapproximated with o plain in interrupted suture.   The umbilical and pfannesteil skin incisions  Were approximated 4-0 monocryl with subcuticular stitches.  The 5mm incision was closed with dermabond. All incisions reinforced with dermabond.    Cystoscopy was done and urine was seen to eflux from the ureteral orifices.  Indigo carmine was not used because there is a Geneticist, molecular.  Foley was removed before the cysto and replaced after the cystoscopy.  Instrument, sponge, and needle counts were correct prior to abdominal closure and at the conclusion of the case.   Findings: fibroid uterus.  Irregular and bulky.  Normal appearing  adnexa B  Estimated Blood Loss:  500 mL         Drains: none         Total IV Fluids:         Specimens: uterus , cervix and bilateral tubes         Implants: none         Complications:  None; patient tolerated the procedure well.         Disposition: PACU - hemodynamically stable.         Condition: stable  Attending Attestation: I was present and scrubbed for the entire procedure.

## 2013-06-24 NOTE — Preoperative (Signed)
Beta Blockers   Reason not to administer Beta Blockers:Not Applicable 

## 2013-06-24 NOTE — Anesthesia Preprocedure Evaluation (Signed)
Anesthesia Evaluation  Patient identified by MRN, date of birth, ID band Patient awake    Reviewed: Allergy & Precautions, H&P , Patient's Chart, lab work & pertinent test results  History of Anesthesia Complications (+) history of anesthetic complications  Airway Mallampati: III TM Distance: >3 FB Neck ROM: Full    Dental no notable dental hx. (+) Teeth Intact   Pulmonary asthma ,  breath sounds clear to auscultation  Pulmonary exam normal       Cardiovascular negative cardio ROS  Rhythm:Regular Rate:Normal     Neuro/Psych negative neurological ROS  negative psych ROS   GI/Hepatic negative GI ROS, Neg liver ROS,   Endo/Other  Morbid obesity  Renal/GU negative Renal ROS  negative genitourinary   Musculoskeletal negative musculoskeletal ROS (+)   Abdominal (+) + obese,   Peds  Hematology negative hematology ROS (+)   Anesthesia Other Findings   Reproductive/Obstetrics Menorrhagia                           Anesthesia Physical Anesthesia Plan  ASA: III  Anesthesia Plan: General   Post-op Pain Management:    Induction: Intravenous  Airway Management Planned: Oral ETT  Additional Equipment:   Intra-op Plan:   Post-operative Plan: Extubation in OR  Informed Consent: I have reviewed the patients History and Physical, chart, labs and discussed the procedure including the risks, benefits and alternatives for the proposed anesthesia with the patient or authorized representative who has indicated his/her understanding and acceptance.   Dental advisory given  Plan Discussed with: CRNA, Anesthesiologist and Surgeon  Anesthesia Plan Comments:         Anesthesia Quick Evaluation

## 2013-06-25 ENCOUNTER — Encounter (HOSPITAL_COMMUNITY): Payer: Self-pay | Admitting: Obstetrics and Gynecology

## 2013-06-25 LAB — CBC
HCT: 30.3 % — ABNORMAL LOW (ref 36.0–46.0)
Hemoglobin: 9.6 g/dL — ABNORMAL LOW (ref 12.0–15.0)
MCHC: 31.7 g/dL (ref 30.0–36.0)
RBC: 4.32 MIL/uL (ref 3.87–5.11)
WBC: 15.7 10*3/uL — ABNORMAL HIGH (ref 4.0–10.5)

## 2013-06-25 LAB — BASIC METABOLIC PANEL
Calcium: 9.4 mg/dL (ref 8.4–10.5)
Chloride: 104 mEq/L (ref 96–112)
GFR calc Af Amer: >90 mL/min (ref >90)
Glucose, Bld: 111 mg/dL — ABNORMAL HIGH (ref 70–99)
Potassium: 4.5 mEq/L (ref 3.5–5.1)
Sodium: 138 mEq/L (ref 135–145)

## 2013-06-25 MED ORDER — METOCLOPRAMIDE HCL 10 MG PO TABS
10.0000 mg | ORAL_TABLET | Freq: Three times a day (TID) | ORAL | Status: DC
  Administered 2013-06-25 – 2013-06-26 (×3): 10 mg via ORAL
  Filled 2013-06-25 (×3): qty 1

## 2013-06-25 NOTE — Progress Notes (Signed)
Anne Reese is a37 y.o.  [id number]  Post Op Date # 1:  TAH/BS/Cystoscopy  Subjective: Patient is Doing well postoperatively. Patient has Pain is controlled with current analgesics. Medications being used: prescription NSAID's including Toradol and narcotic analgesics including Vicodin., Denies nausea, tolerating crackers and liquids, ambulating in the halls but hasn't passed flatus yet.  Is enroute to the bathroom but hasn't voided since Foley removed about 2 hours ago.  Objective: Vital signs in last 24 hours: Temp:  [97.8 F (36.6 C)-99.3 F (37.4 C)] 99.3 F (37.4 C) (11/12 0535) Pulse Rate:  [76-108] 99 (11/12 0535) Resp:  [12-24] 16 (11/12 0535) BP: (96-156)/(64-83) 103/66 mmHg (11/12 0535) SpO2:  [95 %-100 %] 95 % (11/12 0535) Weight:  [250 lb (113.399 kg)] 250 lb (113.399 kg) (11/11 1545)  Intake/Output from previous day: 11/11 0701 - 11/12 0700 In: 5619.6 [P.O.:1010; I.V.:4609.6] Out: 2925 [Urine:2425] Intake/Output this shift:    Recent Labs Lab 06/25/13 0521  WBC 15.7*  HGB 9.6*  HCT 30.3*  PLT 390     Recent Labs Lab 06/25/13 0521  NA 138  K 4.5  CL 104  CO2 27  BUN 8  CREATININE 0.76  CALCIUM 9.4  GLUCOSE 111*    EXAM: General: alert, cooperative and no distress Resp: clear to auscultation bilaterally Cardio: regular rate and rhythm, S1, S2 normal, no murmur, click, rub or gallop GI: Soft, bowel sounds present, dressing clean/dry/intact Extremities: Homans sign is negative, no sign of DVT and no calf tenderness.   Assessment: s/p Procedure(s): HYSTERECTOMY ABDOMINAL BILATERAL SALPINGECTOMY DILATATION AND CURETTAGE CYSTOSCOPY: stable, progressing well and anemia  Plan: Advance diet Encourage ambulation Routine care.  LOS: 1 day    [name], PA-C 06/25/2013 7:33 AM

## 2013-06-25 NOTE — Progress Notes (Signed)
Pt seen and examined.  She has voided.   reglan given with meals

## 2013-06-26 DIAGNOSIS — Z90710 Acquired absence of both cervix and uterus: Secondary | ICD-10-CM

## 2013-06-26 MED ORDER — IBUPROFEN 600 MG PO TABS: ORAL_TABLET | ORAL | Status: DC

## 2013-06-26 MED ORDER — HYDROCODONE-ACETAMINOPHEN 5-325 MG PO TABS: 1.0000 | ORAL_TABLET | ORAL | Status: DC | PRN

## 2013-06-26 MED ORDER — PROMETHAZINE HCL 12.5 MG PO TABS
12.5000 mg | ORAL_TABLET | Freq: Four times a day (QID) | ORAL | Status: DC | PRN
Start: 2013-06-26 — End: 2014-04-09

## 2013-06-26 MED ORDER — METOCLOPRAMIDE HCL 10 MG PO TABS: 10.0000 mg | ORAL_TABLET | Freq: Three times a day (TID) | ORAL | Status: DC

## 2013-06-26 NOTE — Progress Notes (Signed)
Pt is discharged   In the care of sons. Downstairs per ambulatory escorted by N.t. Denies any pain or discomfort. Incisions is clean and dry. Spirits are good. Discharge instructions with Rx were given to pt. Comperhended all  Instructions well Denies vaginal bleeding. Stable.

## 2013-06-26 NOTE — Discharge Summary (Signed)
Physician Discharge Summary  Patient ID: Anne Reese MRN: [id number] DOB/AGE: [birth year] 37 y.o.  Admit date: 06/24/2013 Discharge date: 06/26/2013   Discharge Diagnoses: Symptomatic Uterine Fibroids, Menorrhagia, Dysmenorrhea and Anemia Principal Problem:   S/P TAH-BSO   Operation: Total Abdominal Hysterectomy with Bilateral Salpingectomy and Cystoscopy BP 102/51  Pulse 84  Temp(Src) 98.7 F (37.1 C) (Oral)  Resp 18  Ht 5\' 3"  (1.6 m)  Wt 250 lb (113.399 kg)  BMI 44.30 kg/m2  SpO2 100% Physical Examination: General appearance - alert, well appearing, and in no distress Chest - clear to auscultation, no wheezes, rales or rhonchi, symmetric air entry Heart - normal rate and regular rhythm Abdomen - soft, nontender, nondistended, no masses or organomegaly Extremities - Homan's sign negative bilaterally Incision CDI   Discharged Condition: Good  Hospital Course: On the date of admission, the patient underwent the aforementioned procedures, tolerating them well.  Post operative course was unremarkable with the patient resuming bowel and bladder function by post operative day #2 and was therefore deemed ready for discharge home.  Discharge hemoglobin was 9.6  (pre-op hemoglobin = 11.1).  Disposition: 01-Home or Self Care  Discharge Medications:   Vicodin 5-325   1-2 every 4 hours prn-pain Ibuprofen 600 mg 1 pc every 6 hours x 5 days then prn-pain Phenergan 12.5 mg  1 every 6 hours prn-nausea    Follow-up: Dr. Jaymes Graff,  August 11, 2013 at 1:15 p.m.    SignedHenreitta Leber, PA-C 06/26/2013, 7:50 AM

## 2013-06-26 NOTE — Progress Notes (Signed)
RHIANNON SASSAMAN is a37 y.o.  [id number]  Post Op Date # TAH/BS/Cystoscopy  Subjective: Patient is Doing well postoperatively. Patient has Pain controlled with oral analgesia (Vicodin)., Ambulating, voiding and tolerating regular diet without difficulty.  Objective: Vital signs in last 24 hours: Temp:  [97.7 F (36.5 C)-98.5 F (36.9 C)] 97.7 F (36.5 C) (11/13 0544) Pulse Rate:  [82-98] 92 (11/13 0544) Resp:  [18] 18 (11/13 0544) BP: (96-108)/(41-65) 108/65 mmHg (11/13 0544) SpO2:  [94 %-98 %] 98 % (11/13 0544)  Intake/Output from previous day:   Intake/Output this shift:    Recent Labs Lab 06/25/13 0521  WBC 15.7*  HGB 9.6*  HCT 30.3*  PLT 390     Recent Labs Lab 06/25/13 0521  NA 138  K 4.5  CL 104  CO2 27  BUN 8  CREATININE 0.76  CALCIUM 9.4  GLUCOSE 111*    EXAM: General: alert, cooperative and no distress Resp: clear to auscultation bilaterally Cardio: regular rate and rhythm, S1, S2 normal, no murmur, click, rub or gallop GI: Soft with good bowel sounds Extremities: Homans sign is negative, no sign of DVT and no calf tenderness Incision with good wound edge approximation and no evidence of infection.  Steri-strips in place.   Assessment: s/p Procedure(s): HYSTERECTOMY ABDOMINAL BILATERAL SALPINGECTOMY DILATATION AND CURETTAGE CYSTOSCOPY: stable and anemia  Plan: Discharge home  LOS: 2 days    Brittan Butterbaugh, PA-C 06/26/2013 7:45 AM

## 2014-03-24 ENCOUNTER — Encounter (HOSPITAL_COMMUNITY): Payer: Self-pay | Admitting: Emergency Medicine

## 2014-03-24 ENCOUNTER — Emergency Department (INDEPENDENT_AMBULATORY_CARE_PROVIDER_SITE_OTHER)
Admission: EM | Admit: 2014-03-24 | Discharge: 2014-03-24 | Disposition: A | Discharge status: Home / Self Care | Payer: Medicaid Other | Source: Home / Self Care | Attending: Family Medicine | Admitting: Family Medicine

## 2014-03-24 DIAGNOSIS — M722 Plantar fascial fibromatosis: Secondary | ICD-10-CM

## 2014-03-24 MED ORDER — IBUPROFEN 800 MG PO TABS: 800.0000 mg | ORAL_TABLET | Freq: Three times a day (TID) | ORAL | Status: DC

## 2014-03-24 MED ORDER — HYDROCODONE-ACETAMINOPHEN 5-325 MG PO TABS: 1.0000 | ORAL_TABLET | Freq: Four times a day (QID) | ORAL | Status: DC | PRN

## 2014-03-24 NOTE — Discharge Instructions (Signed)
Plantar Fasciitis (Heel Spur Syndrome) with Rehab The plantar fascia is a fibrous, ligament-like, soft-tissue structure that spans the bottom of the foot. Plantar fasciitis is a condition that causes pain in the foot due to inflammation of the tissue. SYMPTOMS   Pain and tenderness on the underneath side of the foot.  Pain that worsens with standing or walking. CAUSES  Plantar fasciitis is caused by irritation and injury to the plantar fascia on the underneath side of the foot. Common mechanisms of injury include:  Direct trauma to bottom of the foot.  Damage to a small nerve that runs under the foot where the main fascia attaches to the heel bone.  Stress placed on the plantar fascia due to bone spurs. RISK INCREASES WITH:   Activities that place stress on the plantar fascia (running, jumping, pivoting, or cutting).  Poor strength and flexibility.  Improperly fitted shoes.  Tight calf muscles.  Flat feet.  Failure to warm-up properly before activity.  Obesity. PREVENTION  Warm up and stretch properly before activity.  Allow for adequate recovery between workouts.  Maintain physical fitness:  Strength, flexibility, and endurance.  Cardiovascular fitness.  Maintain a health body weight.  Avoid stress on the plantar fascia.  Wear properly fitted shoes, including arch supports for individuals who have flat feet. PROGNOSIS  If treated properly, then the symptoms of plantar fasciitis usually resolve without surgery. However, occasionally surgery is necessary. RELATED COMPLICATIONS   Recurrent symptoms that may result in a chronic condition.  Problems of the lower back that are caused by compensating for the injury, such as limping.  Pain or weakness of the foot during push-off following surgery.  Chronic inflammation, scarring, and partial or complete fascia tear, occurring more often from repeated injections. TREATMENT  Treatment initially involves the use of  ice and medication to help reduce pain and inflammation. The use of strengthening and stretching exercises may help reduce pain with activity, especially stretches of the Achilles tendon. These exercises may be performed at home or with a therapist. Your caregiver may recommend that you use heel cups of arch supports to help reduce stress on the plantar fascia. Occasionally, corticosteroid injections are given to reduce inflammation. If symptoms persist for greater than 6 months despite non-surgical (conservative), then surgery may be recommended.  MEDICATION   If pain medication is necessary, then nonsteroidal anti-inflammatory medications, such as aspirin and ibuprofen, or other minor pain relievers, such as acetaminophen, are often recommended.  Do not take pain medication within 7 days before surgery.  Prescription pain relievers may be given if deemed necessary by your caregiver. Use only as directed and only as much as you need.  Corticosteroid injections may be given by your caregiver. These injections should be reserved for the most serious cases, because they may only be given a certain number of times. HEAT AND COLD  Cold treatment (icing) relieves pain and reduces inflammation. Cold treatment should be applied for 10 to 15 minutes every 2 to 3 hours for inflammation and pain and immediately after any activity that aggravates your symptoms. Use ice packs or massage the area with a piece of ice (ice massage).  Heat treatment may be used prior to performing the stretching and strengthening activities prescribed by your caregiver, physical therapist, or athletic trainer. Use a heat pack or soak the injury in warm water. SEEK IMMEDIATE MEDICAL CARE IF:  Treatment seems to offer no benefit, or the condition worsens.  Any medications produce adverse side effects. EXERCISES RANGE   OF MOTION (ROM) AND STRETCHING EXERCISES - Plantar Fasciitis (Heel Spur Syndrome) These exercises may help you  when beginning to rehabilitate your injury. Your symptoms may resolve with or without further involvement from your physician, physical therapist or athletic trainer. While completing these exercises, remember:   Restoring tissue flexibility helps normal motion to return to the joints. This allows healthier, less painful movement and activity.  An effective stretch should be held for at least 30 seconds.  A stretch should never be painful. You should only feel a gentle lengthening or release in the stretched tissue. RANGE OF MOTION - Toe Extension, Flexion  Sit with your right / left leg crossed over your opposite knee.  Grasp your toes and gently pull them back toward the top of your foot. You should feel a stretch on the bottom of your toes and/or foot.  Hold this stretch for __________ seconds.  Now, gently pull your toes toward the bottom of your foot. You should feel a stretch on the top of your toes and or foot.  Hold this stretch for __________ seconds. Repeat __________ times. Complete this stretch __________ times per day.  RANGE OF MOTION - Ankle Dorsiflexion, Active Assisted  Remove shoes and sit on a chair that is preferably not on a carpeted surface.  Place right / left foot under knee. Extend your opposite leg for support.  Keeping your heel down, slide your right / left foot back toward the chair until you feel a stretch at your ankle or calf. If you do not feel a stretch, slide your bottom forward to the edge of the chair, while still keeping your heel down.  Hold this stretch for __________ seconds. Repeat __________ times. Complete this stretch __________ times per day.  STRETCH - Gastroc, Standing  Place hands on wall.  Extend right / left leg, keeping the front knee somewhat bent.  Slightly point your toes inward on your back foot.  Keeping your right / left heel on the floor and your knee straight, shift your weight toward the wall, not allowing your back to  arch.  You should feel a gentle stretch in the right / left calf. Hold this position for __________ seconds. Repeat __________ times. Complete this stretch __________ times per day. STRETCH - Soleus, Standing  Place hands on wall.  Extend right / left leg, keeping the other knee somewhat bent.  Slightly point your toes inward on your back foot.  Keep your right / left heel on the floor, bend your back knee, and slightly shift your weight over the back leg so that you feel a gentle stretch deep in your back calf.  Hold this position for __________ seconds. Repeat __________ times. Complete this stretch __________ times per day. STRETCH - Gastrocsoleus, Standing  Note: This exercise can place a lot of stress on your foot and ankle. Please complete this exercise only if specifically instructed by your caregiver.   Place the ball of your right / left foot on a step, keeping your other foot firmly on the same step.  Hold on to the wall or a rail for balance.  Slowly lift your other foot, allowing your body weight to press your heel down over the edge of the step.  You should feel a stretch in your right / left calf.  Hold this position for __________ seconds.  Repeat this exercise with a slight bend in your right / left knee. Repeat __________ times. Complete this stretch __________ times per day.    STRENGTHENING EXERCISES - Plantar Fasciitis (Heel Spur Syndrome)  These exercises may help you when beginning to rehabilitate your injury. They may resolve your symptoms with or without further involvement from your physician, physical therapist or athletic trainer. While completing these exercises, remember:   Muscles can gain both the endurance and the strength needed for everyday activities through controlled exercises.  Complete these exercises as instructed by your physician, physical therapist or athletic trainer. Progress the resistance and repetitions only as guided. STRENGTH -  Towel Curls  Sit in a chair positioned on a non-carpeted surface.  Place your foot on a towel, keeping your heel on the floor.  Pull the towel toward your heel by only curling your toes. Keep your heel on the floor.  If instructed by your physician, physical therapist or athletic trainer, add ____________________ at the end of the towel. Repeat __________ times. Complete this exercise __________ times per day. STRENGTH - Ankle Inversion  Secure one end of a rubber exercise band/tubing to a fixed object (table, pole). Loop the other end around your foot just before your toes.  Place your fists between your knees. This will focus your strengthening at your ankle.  Slowly, pull your big toe up and in, making sure the band/tubing is positioned to resist the entire motion.  Hold this position for __________ seconds.  Have your muscles resist the band/tubing as it slowly pulls your foot back to the starting position. Repeat __________ times. Complete this exercises __________ times per day.  Document Released: 07/31/2005 Document Revised: 10/23/2011 Document Reviewed: 11/12/2008 [employer] Patient Information 2015 [employer]. This information is not intended to replace advice given to you by your health care provider. Make sure you discuss any questions you have with your health care provider.  

## 2014-03-24 NOTE — ED Notes (Signed)
C/o plantar fasciitis bilateral feet States she work on concrete floor and last night feet was swollen  otc medications taking as tx

## 2014-03-24 NOTE — ED Provider Notes (Signed)
CSN: [id number]     Arrival date & time 03/24/14  2979 History   First MD Initiated Contact with Patient 03/24/14 1033     Chief Complaint  Patient presents with  . Plantar Fasciitis   (Consider location/radiation/quality/duration/timing/severity/associated sxs/prior Treatment) HPI Comments: 38 year old female presents complaining of plantar fasciitis in both of her feet. She has had plantar fasciitis for years and is under the care of a podiatrist. She called them to be seen but they could not see her so she came here. She recently started a new job and has been on her feet all day for the past 3 days on a hard concrete floor. She has been having severe pain by the end of the day. The pain is located in the bottoms of her feet and radiates up to her heels. Her feet have also seemed swollen by the end of the day. She has had cortisone injections in her feet which was not helpful. She takes ibuprofen which also does not help significantly. No  known injury.   Past Medical History  Diagnosis Date  . Fibroids 2013  . Complication of anesthesia     asthma attack in PACU  . Asthma     no inhaler use x 15 yrs   Past Surgical History  Procedure Laterality Date  . Tubal ligation    . Ganglion cyst excision    . Abdominal hysterectomy N/A 06/24/2013    Procedure: HYSTERECTOMY ABDOMINAL;  Surgeon: Betsy Coder, MD;  Location: [facility];  Service: Gynecology;  Laterality: N/A;  . Bilateral salpingectomy Bilateral 06/24/2013    Procedure: BILATERAL SALPINGECTOMY;  Surgeon: Betsy Coder, MD;  Location: [facility];  Service: Gynecology;  Laterality: Bilateral;  . Dilation and curettage of uterus N/A 06/24/2013    Procedure: DILATATION AND CURETTAGE;  Surgeon: Betsy Coder, MD;  Location: [facility];  Service: Gynecology;  Laterality: N/A;  . Cystoscopy N/A 06/24/2013    Procedure: CYSTOSCOPY;  Surgeon: Betsy Coder, MD;  Location: [facility];  Service: Gynecology;  Laterality: N/A;   Family History   Problem Relation Age of Onset  . Hypertension Maternal Grandfather   . Hypertension Mother    History  Substance Use Topics  . Smoking status: Never Smoker   . Smokeless tobacco: Never Used  . Alcohol Use: No   OB History   Grav Para Term Preterm Abortions TAB SAB Ect Mult Living   3         3     Review of Systems  Musculoskeletal:       See HPI  All other systems reviewed and are negative.   Allergies  Review of patient's allergies indicates no known allergies.  Home Medications   Prior to Admission medications   Medication Sig Start Date End Date Taking? Authorizing Provider  HYDROcodone-acetaminophen (NORCO) 5-325 MG per tablet Take 1 tablet by mouth every 6 (six) hours as needed for moderate pain. 03/24/14   Liam Graham, PA-C  HYDROcodone-acetaminophen (NORCO/VICODIN) 5-325 MG per tablet Take 1-2 tablets by mouth every 4 (four) hours as needed for moderate pain. 06/26/13   Earnstine Regal, PA-C  ibuprofen (ADVIL,MOTRIN) 600 MG tablet 1 po with food, every 6 hours x 5 days then as needed for pain 06/26/13   Earnstine Regal, PA-C  ibuprofen (ADVIL,MOTRIN) 800 MG tablet Take 1 tablet (800 mg total) by mouth 3 (three) times daily. 03/24/14   Liam Graham, PA-C  metoCLOPramide (REGLAN) 10 MG tablet Take 1 tablet (  10 mg total) by mouth 4 (four) times daily -  before meals and at bedtime. 06/26/13 07/01/13  Naima A Dillard, MD  promethazine (PHENERGAN) 12.5 MG tablet Take 1 tablet (12.5 mg total) by mouth every 6 (six) hours as needed for nausea or vomiting. 06/26/13   Elmira Powell, PA-C   BP 130/78  Pulse 91  Temp(Src) 98.7 F (37.1 C) (Oral)  Resp 16  SpO2 98%  LMP 05/19/2013 Physical Exam  Nursing note and vitals reviewed. Constitutional: She is oriented to person, place, and time. Vital signs are normal. She appears well-developed and well-nourished. No distress.  HENT:  Head: Normocephalic and atraumatic.  Cardiovascular:  Pulses:      Dorsalis pedis pulses are  2+ on the right side, and 2+ on the left side.  Pulmonary/Chest: Effort normal. No respiratory distress.  Musculoskeletal:  On the bilateral feet, she has tenderness at the origin of the plantar fascia up through the heel. There are no objective abnormalities with the feet.  Neurological: She is alert and oriented to person, place, and time. She has normal strength. No sensory deficit. She exhibits normal muscle tone. Coordination and gait normal.  Skin: Skin is warm and dry. No rash noted. She is not diaphoretic.  Psychiatric: She has a normal mood and affect. Judgment normal.    ED Course  Procedures (including critical care time) Labs Review Labs Reviewed - No data to display  Imaging Review No results found.   MDM   1. Plantar fasciitis, bilateral    800 mg ibuprofen during the day, and a few tablets of hydrocodone that she may take after work. She is already completing stretching exercises and already uses a heel cup. She will followup with podiatry.  Meds ordered this encounter  Medications  . ibuprofen (ADVIL,MOTRIN) 800 MG tablet    Sig: Take 1 tablet (800 mg total) by mouth 3 (three) times daily.    Dispense:  60 tablet    Refill:  0    Order Specific Question:  Supervising Provider    Answer:  Lynne Leader, [place]  . HYDROcodone-acetaminophen (NORCO) 5-325 MG per tablet    Sig: Take 1 tablet by mouth every 6 (six) hours as needed for moderate pain.    Dispense:  15 tablet    Refill:  0    Order Specific Question:  Supervising Provider    Answer:  Lynne Leader, [place]       Liam Graham, PA-C 03/25/14 1044

## 2014-03-30 NOTE — ED Provider Notes (Signed)
Medical screening examination/treatment/procedure(s) were performed by a resident physician or non-physician practitioner and as the supervising physician I was immediately available for consultation/collaboration.  Linna Darner, MD Family Medicine   Waldemar Dickens, MD 03/30/14 2114

## 2014-04-09 ENCOUNTER — Emergency Department (HOSPITAL_COMMUNITY)
Admission: EM | Admit: 2014-04-09 | Discharge: 2014-04-09 | Disposition: A | Discharge status: Home / Self Care | Payer: Medicaid Other | Attending: Emergency Medicine | Admitting: Emergency Medicine

## 2014-04-09 ENCOUNTER — Encounter (HOSPITAL_COMMUNITY): Payer: Self-pay | Admitting: Emergency Medicine

## 2014-04-09 DIAGNOSIS — R059 Cough, unspecified: Secondary | ICD-10-CM | POA: Insufficient documentation

## 2014-04-09 DIAGNOSIS — Z791 Long term (current) use of non-steroidal anti-inflammatories (NSAID): Secondary | ICD-10-CM | POA: Insufficient documentation

## 2014-04-09 DIAGNOSIS — R05 Cough: Secondary | ICD-10-CM | POA: Insufficient documentation

## 2014-04-09 DIAGNOSIS — I10 Essential (primary) hypertension: Secondary | ICD-10-CM | POA: Insufficient documentation

## 2014-04-09 DIAGNOSIS — Z8542 Personal history of malignant neoplasm of other parts of uterus: Secondary | ICD-10-CM | POA: Diagnosis not present

## 2014-04-09 DIAGNOSIS — J029 Acute pharyngitis, unspecified: Secondary | ICD-10-CM | POA: Diagnosis not present

## 2014-04-09 DIAGNOSIS — T7840XA Allergy, unspecified, initial encounter: Secondary | ICD-10-CM

## 2014-04-09 DIAGNOSIS — J45909 Unspecified asthma, uncomplicated: Secondary | ICD-10-CM | POA: Insufficient documentation

## 2014-04-09 MED ORDER — FAMOTIDINE 20 MG PO TABS
20.0000 mg | ORAL_TABLET | Freq: Once | ORAL | Status: AC
  Administered 2014-04-09: 20 mg via ORAL
  Filled 2014-04-09: qty 1

## 2014-04-09 MED ORDER — FAMOTIDINE 20 MG PO TABS: 20.0000 mg | ORAL_TABLET | Freq: Two times a day (BID) | ORAL | Status: DC

## 2014-04-09 MED ORDER — PREDNISONE 20 MG PO TABS
60.0000 mg | ORAL_TABLET | Freq: Once | ORAL | Status: AC
  Administered 2014-04-09: 60 mg via ORAL
  Filled 2014-04-09: qty 3

## 2014-04-09 MED ORDER — PROMETHAZINE-DM 6.25-15 MG/5ML PO SYRP: 5.0000 mL | ORAL_SOLUTION | Freq: Four times a day (QID) | ORAL | Status: DC | PRN

## 2014-04-09 MED ORDER — PREDNISONE 20 MG PO TABS: 40.0000 mg | ORAL_TABLET | Freq: Every day | ORAL | Status: DC

## 2014-04-09 MED ORDER — ALBUTEROL SULFATE (2.5 MG/3ML) 0.083% IN NEBU
5.0000 mg | INHALATION_SOLUTION | Freq: Once | RESPIRATORY_TRACT | Status: AC
  Administered 2014-04-09: 5 mg via RESPIRATORY_TRACT
  Filled 2014-04-09: qty 6

## 2014-04-09 MED ORDER — ALBUTEROL SULFATE HFA 108 (90 BASE) MCG/ACT IN AERS
2.0000 | INHALATION_SPRAY | RESPIRATORY_TRACT | Status: DC | PRN
  Administered 2014-04-09: 2 via RESPIRATORY_TRACT
  Filled 2014-04-09: qty 6.7

## 2014-04-09 NOTE — ED Provider Notes (Signed)
CSN: [id number]     Arrival date & time 04/09/14  0436 History   First MD Initiated Contact with Patient 04/09/14 0457     Chief Complaint  Patient presents with  . Cough     (Consider location/radiation/quality/duration/timing/severity/associated sxs/prior Treatment) HPI Comments: Reports 2 days of intermittent cough and throat tightening, Denies URI symptoms tried taking an allergy pill just PTA.  Hx asthma as a child  No treatments in a while. Denies any new medications, foods previous hx allergic reaction    Patient is a 38 y.o. female presenting with cough. The history is provided by the patient.  Cough Cough characteristics:  Non-productive and dry Severity:  Moderate Onset quality:  Gradual Timing:  Constant Progression:  Worsening Chronicity:  New Smoker: no   Context: not upper respiratory infection   Relieved by:  Nothing Worsened by:  Activity Ineffective treatments:  None tried Associated symptoms: sore throat   Associated symptoms: no fever, no rash, no rhinorrhea and no wheezing   Sore throat:    Severity:  Moderate (feels tight )   Onset quality:  Gradual   Timing:  Intermittent   Progression:  Worsening   Past Medical History  Diagnosis Date  . Fibroids 2013  . Complication of anesthesia     asthma attack in PACU  . Asthma     no inhaler use x 15 yrs   Past Surgical History  Procedure Laterality Date  . Tubal ligation    . Ganglion cyst excision    . Abdominal hysterectomy N/A 06/24/2013    Procedure: HYSTERECTOMY ABDOMINAL;  Surgeon: Betsy Coder, MD;  Location: [facility];  Service: Gynecology;  Laterality: N/A;  . Bilateral salpingectomy Bilateral 06/24/2013    Procedure: BILATERAL SALPINGECTOMY;  Surgeon: Betsy Coder, MD;  Location: [facility];  Service: Gynecology;  Laterality: Bilateral;  . Dilation and curettage of uterus N/A 06/24/2013    Procedure: DILATATION AND CURETTAGE;  Surgeon: Betsy Coder, MD;  Location: [facility];  Service:  Gynecology;  Laterality: N/A;  . Cystoscopy N/A 06/24/2013    Procedure: CYSTOSCOPY;  Surgeon: Betsy Coder, MD;  Location: [facility];  Service: Gynecology;  Laterality: N/A;   Family History  Problem Relation Age of Onset  . Hypertension Maternal Grandfather   . Hypertension Mother    History  Substance Use Topics  . Smoking status: Never Smoker   . Smokeless tobacco: Never Used  . Alcohol Use: No   OB History   Grav Para Term Preterm Abortions TAB SAB Ect Mult Living   3         3     Review of Systems  Constitutional: Negative for fever and appetite change.  HENT: Positive for sore throat. Negative for rhinorrhea and trouble swallowing.        Feels as though her throat is tightening up   Respiratory: Positive for cough. Negative for wheezing.   Gastrointestinal: Negative for nausea and vomiting.  Musculoskeletal: Negative for neck pain.  Skin: Negative for rash.      Allergies  Review of patient's allergies indicates no known allergies.  Home Medications   Prior to Admission medications   Medication Sig Start Date End Date Taking? Authorizing Provider  HYDROcodone-acetaminophen (NORCO) 5-325 MG per tablet Take 1 tablet by mouth every 6 (six) hours as needed for moderate pain. 03/24/14  Yes Liam Graham, PA-C  ibuprofen (ADVIL,MOTRIN) 800 MG tablet Take 1 tablet (800 mg total) by mouth 3 (three) times daily.  03/24/14  Yes Liam Graham, PA-C  famotidine (PEPCID) 20 MG tablet Take 1 tablet (20 mg total) by mouth 2 (two) times daily. 04/09/14   Garald Balding, NP  predniSONE (DELTASONE) 20 MG tablet Take 2 tablets (40 mg total) by mouth daily with breakfast. 04/09/14   Garald Balding, NP  promethazine-dextromethorphan (PROMETHAZINE-DM) 6.25-15 MG/5ML syrup Take 5 mLs by mouth 4 (four) times daily as needed for cough. 04/09/14   Resa Miner Lawyer, PA-C   BP 127/70  Pulse 94  Temp(Src) 98.1 F (36.7 C) (Oral)  Resp 14  SpO2 98%  LMP 05/19/2013 Physical Exam   Nursing note and vitals reviewed. Constitutional: She is oriented to person, place, and time. She appears well-developed and well-nourished.  HENT:  Head: Normocephalic.  Right Ear: External ear normal.  Left Ear: External ear normal.  Mouth/Throat: Oropharynx is clear and moist.  Eyes: Pupils are equal, round, and reactive to light.  Neck: Normal range of motion.  Cardiovascular: Normal rate and regular rhythm.   Pulmonary/Chest: Effort normal and breath sounds normal. No respiratory distress.  Musculoskeletal: Normal range of motion.  Lymphadenopathy:    She has no cervical adenopathy.  Neurological: She is alert and oriented to person, place, and time.  Skin: Skin is warm. No rash noted. No erythema.    ED Course  Procedures (including critical care time) Labs Review Labs Reviewed - No data to display  Imaging Review No results found.   EKG Interpretation None      MDM   Final diagnoses:  Allergic reaction, initial encounter     After Albuterol neb and PO steroid patient decreased cough and feels as though her throat is opening     Garald Balding, NP 04/09/14 2341  Garald Balding, NP 04/09/14 2342

## 2014-04-09 NOTE — ED Notes (Signed)
PT ambulated with baseline gait; VSS; A&Ox3; no signs of distress; respirations even and unlabored; skin warm and dry; no questions upon discharge.  

## 2014-04-09 NOTE — Discharge Instructions (Signed)
Take all the medication as directed Use the inhaler as follows 2 puffs every 4-6 hours while awake for 2 dasy than as needed

## 2014-04-09 NOTE — ED Notes (Signed)
Pt report that she has been having a cough x 2 days. Pt also reports that she feels like her throat is tight and swelling. Pt speaking in full sentences. Pt is not having any difficulty swallowing saliva. NAD at this time.

## 2014-04-10 NOTE — ED Provider Notes (Signed)
Medical screening examination/treatment/procedure(s) were performed by non-physician practitioner and as supervising physician I was immediately available for consultation/collaboration.   EKG Interpretation None        Everlene Balls, MD 04/10/14 [phone]

## 2014-04-16 ENCOUNTER — Emergency Department (HOSPITAL_COMMUNITY)
Admission: EM | Admit: 2014-04-16 | Discharge: 2014-04-16 | Disposition: A | Discharge status: Home / Self Care | Payer: Medicaid Other | Attending: Emergency Medicine | Admitting: Emergency Medicine

## 2014-04-16 ENCOUNTER — Encounter (HOSPITAL_COMMUNITY): Payer: Self-pay | Admitting: Emergency Medicine

## 2014-04-16 DIAGNOSIS — M722 Plantar fascial fibromatosis: Secondary | ICD-10-CM

## 2014-04-16 DIAGNOSIS — M79609 Pain in unspecified limb: Secondary | ICD-10-CM | POA: Insufficient documentation

## 2014-04-16 DIAGNOSIS — Z79899 Other long term (current) drug therapy: Secondary | ICD-10-CM | POA: Insufficient documentation

## 2014-04-16 DIAGNOSIS — Z8742 Personal history of other diseases of the female genital tract: Secondary | ICD-10-CM | POA: Insufficient documentation

## 2014-04-16 DIAGNOSIS — IMO0002 Reserved for concepts with insufficient information to code with codable children: Secondary | ICD-10-CM | POA: Insufficient documentation

## 2014-04-16 DIAGNOSIS — J45909 Unspecified asthma, uncomplicated: Secondary | ICD-10-CM | POA: Insufficient documentation

## 2014-04-16 MED ORDER — TRAMADOL HCL 50 MG PO TABS
50.0000 mg | ORAL_TABLET | Freq: Once | ORAL | Status: AC
  Administered 2014-04-16: 50 mg via ORAL
  Filled 2014-04-16: qty 1

## 2014-04-16 MED ORDER — TRAMADOL HCL 50 MG PO TABS: 50.0000 mg | ORAL_TABLET | Freq: Two times a day (BID) | ORAL | Status: DC | PRN

## 2014-04-16 MED ORDER — TRAMADOL HCL 50 MG PO TABS: 50.0000 mg | ORAL_TABLET | Freq: Once | ORAL | Status: DC

## 2014-04-16 NOTE — Discharge Instructions (Signed)
Plantar Fasciitis Ms. Fields, you were seen today for foot pain.  You can take tramadol for severe pain and use motrin to decrease inflammation.  Follow up with your foot doctor for possible surgical intervention.  It will also help if you lose weight to ease the burden on your feet.  If your symptoms worsen, return to the ED for repeat evaluation.  Thank you. Plantar fasciitis is a common condition that causes foot pain. It is soreness (inflammation) of the band of tough fibrous tissue on the bottom of the foot that runs from the heel bone (calcaneus) to the ball of the foot. The cause of this soreness may be from excessive standing, poor fitting shoes, running on hard surfaces, being overweight, having an abnormal walk, or overuse (this is common in runners) of the painful foot or feet. It is also common in aerobic exercise dancers and ballet dancers. SYMPTOMS  Most people with plantar fasciitis complain of:  Severe pain in the morning on the bottom of their foot especially when taking the first steps out of bed. This pain recedes after a few minutes of walking.  Severe pain is experienced also during walking following a long period of inactivity.  Pain is worse when walking barefoot or up stairs DIAGNOSIS   Your caregiver will diagnose this condition by examining and feeling your foot.  Special tests such as X-rays of your foot, are usually not needed. PREVENTION   Consult a sports medicine professional before beginning a new exercise program.  Walking programs offer a good workout. With walking there is a lower chance of overuse injuries common to runners. There is less impact and less jarring of the joints.  Begin all new exercise programs slowly. If problems or pain develop, decrease the amount of time or distance until you are at a comfortable level.  Wear good shoes and replace them regularly.  Stretch your foot and the heel cords at the back of the ankle (Achilles tendon) both  before and after exercise.  Run or exercise on even surfaces that are not hard. For example, asphalt is better than pavement.  Do not run barefoot on hard surfaces.  If using a treadmill, vary the incline.  Do not continue to workout if you have foot or joint problems. Seek professional help if they do not improve. HOME CARE INSTRUCTIONS   Avoid activities that cause you pain until you recover.  Use ice or cold packs on the problem or painful areas after working out.  Only take over-the-counter or prescription medicines for pain, discomfort, or fever as directed by your caregiver.  Soft shoe inserts or athletic shoes with air or gel sole cushions may be helpful.  If problems continue or become more severe, consult a sports medicine caregiver or your own health care provider. Cortisone is a potent anti-inflammatory medication that may be injected into the painful area. You can discuss this treatment with your caregiver. MAKE SURE YOU:   Understand these instructions.  Will watch your condition.  Will get help right away if you are not doing well or get worse. Document Released: 04/25/2001 Document Revised: 10/23/2011 Document Reviewed: 06/24/2008 [facility] Patient Information 2015 [place], [place]. This information is not intended to replace advice given to you by your health care provider. Make sure you discuss any questions you have with your health care provider.

## 2014-04-16 NOTE — ED Notes (Signed)
\][ '  The pt reports that she has had bi-lateral foot pain just today and the pain has gone into both  calfs  While she was working

## 2014-05-04 NOTE — ED Provider Notes (Signed)
CSN: [id number]     Arrival date & time 04/16/14  0153 History   First MD Initiated Contact with Patient 04/16/14 0217     Chief Complaint  Patient presents with  . Foot Pain     (Consider location/radiation/quality/duration/timing/severity/associated sxs/prior Treatment) HPI Anne Reese is a 38yo female with PMH of plantar fascitis coming in with pain in both of her feet.  Patient states this is consistent with her known diagnosis.  Motrin and tylenol have not been helping for her pain.  She also recently started a new job where she is up on her feet all day long and can no longer stand the pain.  She has follow up with podiatrist but is currently requesting pain relief.  She denies any fevers, recent infections, calf swelling or pain, CP or SOB.  Her bowels and bladder have been normal.  10 Systems reviewed and are negative for acute change except as noted in the HPI.    Past Medical History  Diagnosis Date  . Fibroids 2013  . Complication of anesthesia     asthma attack in PACU  . Asthma     no inhaler use x 15 yrs   Past Surgical History  Procedure Laterality Date  . Tubal ligation    . Ganglion cyst excision    . Abdominal hysterectomy N/A 06/24/2013    Procedure: HYSTERECTOMY ABDOMINAL;  Surgeon: Betsy Coder, MD;  Location: [facility];  Service: Gynecology;  Laterality: N/A;  . Bilateral salpingectomy Bilateral 06/24/2013    Procedure: BILATERAL SALPINGECTOMY;  Surgeon: Betsy Coder, MD;  Location: [facility];  Service: Gynecology;  Laterality: Bilateral;  . Dilation and curettage of uterus N/A 06/24/2013    Procedure: DILATATION AND CURETTAGE;  Surgeon: Betsy Coder, MD;  Location: [facility];  Service: Gynecology;  Laterality: N/A;  . Cystoscopy N/A 06/24/2013    Procedure: CYSTOSCOPY;  Surgeon: Betsy Coder, MD;  Location: [facility];  Service: Gynecology;  Laterality: N/A;   Family History  Problem Relation Age of Onset  . Hypertension Maternal Grandfather   .  Hypertension Mother    History  Substance Use Topics  . Smoking status: Never Smoker   . Smokeless tobacco: Never Used  . Alcohol Use: No   OB History   Grav Para Term Preterm Abortions TAB SAB Ect Mult Living   3         3     Review of Systems    Allergies  Review of patient's allergies indicates no known allergies.  Home Medications   Prior to Admission medications   Medication Sig Start Date End Date Taking? Authorizing Provider  famotidine (PEPCID) 20 MG tablet Take 1 tablet (20 mg total) by mouth 2 (two) times daily. 04/09/14   Garald Balding, NP  HYDROcodone-acetaminophen (NORCO) 5-325 MG per tablet Take 1 tablet by mouth every 6 (six) hours as needed for moderate pain. 03/24/14   Liam Graham, PA-C  ibuprofen (ADVIL,MOTRIN) 800 MG tablet Take 1 tablet (800 mg total) by mouth 3 (three) times daily. 03/24/14   Liam Graham, PA-C  predniSONE (DELTASONE) 20 MG tablet Take 2 tablets (40 mg total) by mouth daily with breakfast. 04/09/14   Garald Balding, NP  promethazine-dextromethorphan (PROMETHAZINE-DM) 6.25-15 MG/5ML syrup Take 5 mLs by mouth 4 (four) times daily as needed for cough. 04/09/14   Resa Miner Lawyer, PA-C  traMADol (ULTRAM) 50 MG tablet Take 1 tablet (50 mg total) by mouth every 12 (twelve) hours  as needed for severe pain. 04/16/14   Everlene Balls, MD   BP 132/83  Pulse 89  Temp(Src) 98.3 F (36.8 C) (Oral)  Resp 20  SpO2 99%  LMP 05/19/2013 Physical Exam  Nursing note and vitals reviewed. Constitutional: She is oriented to person, place, and time. She appears well-developed and well-nourished. No distress.  HENT:  Head: Normocephalic and atraumatic.  Nose: Nose normal.  Mouth/Throat: Oropharynx is clear and moist. No oropharyngeal exudate.  Eyes: Conjunctivae and EOM are normal. Pupils are equal, round, and reactive to light. No scleral icterus.  Neck: Normal range of motion. Neck supple. No JVD present. No tracheal deviation present. No thyromegaly  present.  Cardiovascular: Normal rate, regular rhythm and normal heart sounds.  Exam reveals no gallop and no friction rub.   No murmur heard. Pulmonary/Chest: Effort normal and breath sounds normal. No respiratory distress. She has no wheezes. She exhibits no tenderness.  Abdominal: Soft. Bowel sounds are normal. She exhibits no distension and no mass. There is no tenderness. There is no rebound and no guarding.  Musculoskeletal: Normal range of motion. She exhibits no edema and no tenderness.  Mild erythema seen on medial side of her bilateral feet.  No abnormalities of the plantar surface.  No tenderness to palpation.  Lymphadenopathy:    She has no cervical adenopathy.  Neurological: She is alert and oriented to person, place, and time.  Skin: Skin is warm and dry. No rash noted. No erythema. No pallor.    ED Course  Procedures (including critical care time) Labs Review Labs Reviewed - No data to display  Imaging Review No results found.   EKG Interpretation None      MDM   Final diagnoses:  Plantar fasciitis, bilateral    Patient presents for concerns of pain in her feet.  Sh denies any other symptoms and has no further complaints.  She states this is consistent with her prior diagnosis of plantar fascitis.  She was advised to continue motrin for anti-inflamm effects and can take tramadol for breakthrough pain.  A prescription will be provided.  She was also given tramadol in the ED.  She was advised to follow up with her podiatrist within 3 days for continued care.  She demonstrated understanding.  She was witnessed ambulating in the ED without any assistance and without any difficulty.  Her vital signs remain within her normal limits and the patient is safe for discharge.      Everlene Balls, MD 05/04/14 [phone]

## 2014-06-15 ENCOUNTER — Encounter (HOSPITAL_COMMUNITY): Payer: Self-pay | Admitting: Emergency Medicine

## 2015-01-11 ENCOUNTER — Encounter (HOSPITAL_COMMUNITY): Payer: Self-pay | Admitting: Emergency Medicine

## 2015-01-11 ENCOUNTER — Emergency Department (HOSPITAL_COMMUNITY)
Admission: EM | Admit: 2015-01-11 | Discharge: 2015-01-11 | Disposition: A | Discharge status: Home / Self Care | Payer: BLUE CROSS/BLUE SHIELD | Source: Home / Self Care | Attending: Family Medicine | Admitting: Family Medicine

## 2015-01-11 DIAGNOSIS — M722 Plantar fascial fibromatosis: Secondary | ICD-10-CM | POA: Diagnosis not present

## 2015-01-11 MED ORDER — NAPROXEN 500 MG PO TABS: 500.0000 mg | ORAL_TABLET | Freq: Two times a day (BID) | ORAL | Status: DC

## 2015-01-11 NOTE — ED Provider Notes (Signed)
Anne Reese is a 39 y.o. female who presents to Urgent Care today for bilateral foot pain. Patient has a history of significant bilateral plantar fasciitis present for the last 3 years. She was doing well until recently. 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 She has stabbing throbbing pain in the bottom of her feet bilaterally. This is consistent with previous episodes of plantar fasciitis. She's tried Tylenol which helps a little. Tried in the past I seen stretching exercises which helped a little. She's also had repeated Georgina Snell Mr. injections bilaterally in the past which helped some.   Past Medical History  Diagnosis Date  . Fibroids 2013  . Complication of anesthesia     asthma attack in PACU  . Asthma     no inhaler use x 15 yrs   Past Surgical History  Procedure Laterality Date  . Tubal ligation    . Ganglion cyst excision    . Abdominal hysterectomy N/A 06/24/2013    Procedure: HYSTERECTOMY ABDOMINAL;  Surgeon: Betsy Coder, MD;  Location: [facility];  Service: Gynecology;  Laterality: N/A;  . Bilateral salpingectomy Bilateral 06/24/2013    Procedure: BILATERAL SALPINGECTOMY;  Surgeon: Betsy Coder, MD;  Location: [facility];  Service: Gynecology;  Laterality: Bilateral;  . Dilation and curettage of uterus N/A 06/24/2013    Procedure: DILATATION AND CURETTAGE;  Surgeon: Betsy Coder, MD;  Location: [facility];  Service: Gynecology;  Laterality: N/A;  . Cystoscopy N/A 06/24/2013    Procedure: CYSTOSCOPY;  Surgeon: Betsy Coder, MD;  Location: [facility];  Service: Gynecology;  Laterality: N/A;   History  Substance Use Topics  . Smoking status: Never Smoker   . Smokeless tobacco: Never Used  . Alcohol Use: No   ROS as above Medications: No current facility-administered medications for this encounter.   Current Outpatient Prescriptions  Medication Sig Dispense Refill  . famotidine (PEPCID) 20 MG tablet Take 1 tablet (20 mg total) by mouth 2  (two) times daily. 30 tablet 0  . ibuprofen (ADVIL,MOTRIN) 800 MG tablet Take 1 tablet (800 mg total) by mouth 3 (three) times daily. 60 tablet 0  . naproxen (NAPROSYN) 500 MG tablet Take 1 tablet (500 mg total) by mouth 2 (two) times daily. 30 tablet 0  . promethazine-dextromethorphan (PROMETHAZINE-DM) 6.25-15 MG/5ML syrup Take 5 mLs by mouth 4 (four) times daily as needed for cough. 120 mL 0   No Known Allergies   Exam:  BP 120/80 mmHg  Pulse 77  Temp(Src) 99.2 F (37.3 C) (Oral)  Resp 18  SpO2 98%  LMP 05/19/2013 Gen: Well NAD HEENT: EOMI,  MMM Lungs: Normal work of breathing. CTABL Heart: RRR no MRG Abd: NABS, Soft. Nondistended, Nontender Exts: Brisk capillary refill, warm and well perfused.  Feet: Normal-appearing no swelling or ecchymosis. Tender palpation bilateral plantar calcaneus medial aspect.  No results found for this or any previous visit (from the past 24 hour(s)). No results found.  Assessment and Plan: 39 y.o. female with plantar fasciitis bilaterally. Not much to do at this time. This is a chronic issue. Return to podiatry. Work note provided. Naproxen for pain control.  Discussed warning signs or symptoms. Please see discharge instructions. Patient expresses understanding.     Gregor Hams, MD 01/11/15 [phone]

## 2015-01-11 NOTE — ED Notes (Signed)
Patient c/o plantar fascitis in both feet. Patient reports she has taken OTC pain medications and done foot exercises with no relief. Patient is in NAD. She reports she started a new job and stands up on her feet more.

## 2015-01-11 NOTE — Discharge Instructions (Signed)
Thank you for coming in today.   Plantar Fasciitis (Heel Spur Syndrome) with Rehab The plantar fascia is a fibrous, ligament-like, soft-tissue structure that spans the bottom of the foot. Plantar fasciitis is a condition that causes pain in the foot due to inflammation of the tissue. SYMPTOMS   Pain and tenderness on the underneath side of the foot.  Pain that worsens with standing or walking. CAUSES  Plantar fasciitis is caused by irritation and injury to the plantar fascia on the underneath side of the foot. Common mechanisms of injury include:  Direct trauma to bottom of the foot.  Damage to a small nerve that runs under the foot where the main fascia attaches to the heel bone.  Stress placed on the plantar fascia due to bone spurs. RISK INCREASES WITH:   Activities that place stress on the plantar fascia (running, jumping, pivoting, or cutting).  Poor strength and flexibility.  Improperly fitted shoes.  Tight calf muscles.  Flat feet.  Failure to warm-up properly before activity.  Obesity. PREVENTION  Warm up and stretch properly before activity.  Allow for adequate recovery between workouts.  Maintain physical fitness:  Strength, flexibility, and endurance.  Cardiovascular fitness.  Maintain a health body weight.  Avoid stress on the plantar fascia.  Wear properly fitted shoes, including arch supports for individuals who have flat feet. PROGNOSIS  If treated properly, then the symptoms of plantar fasciitis usually resolve without surgery. However, occasionally surgery is necessary. RELATED COMPLICATIONS   Recurrent symptoms that may result in a chronic condition.  Problems of the lower back that are caused by compensating for the injury, such as limping.  Pain or weakness of the foot during push-off following surgery.  Chronic inflammation, scarring, and partial or complete fascia tear, occurring more often from repeated injections. TREATMENT    Treatment initially involves the use of ice and medication to help reduce pain and inflammation. The use of strengthening and stretching exercises may help reduce pain with activity, especially stretches of the Achilles tendon. These exercises may be performed at home or with a therapist. Your caregiver may recommend that you use heel cups of arch supports to help reduce stress on the plantar fascia. Occasionally, corticosteroid injections are given to reduce inflammation. If symptoms persist for greater than 6 months despite non-surgical (conservative), then surgery may be recommended.  MEDICATION   If pain medication is necessary, then nonsteroidal anti-inflammatory medications, such as aspirin and ibuprofen, or other minor pain relievers, such as acetaminophen, are often recommended.  Do not take pain medication within 7 days before surgery.  Prescription pain relievers may be given if deemed necessary by your caregiver. Use only as directed and only as much as you need.  Corticosteroid injections may be given by your caregiver. These injections should be reserved for the most serious cases, because they may only be given a certain number of times. HEAT AND COLD  Cold treatment (icing) relieves pain and reduces inflammation. Cold treatment should be applied for 10 to 15 minutes every 2 to 3 hours for inflammation and pain and immediately after any activity that aggravates your symptoms. Use ice packs or massage the area with a piece of ice (ice massage).  Heat treatment may be used prior to performing the stretching and strengthening activities prescribed by your caregiver, physical therapist, or athletic trainer. Use a heat pack or soak the injury in warm water. SEEK IMMEDIATE MEDICAL CARE IF:  Treatment seems to offer no benefit, or the condition worsens.  Any medications produce adverse side effects. EXERCISES RANGE OF MOTION (ROM) AND STRETCHING EXERCISES - Plantar Fasciitis (Heel Spur  Syndrome) These exercises may help you when beginning to rehabilitate your injury. Your symptoms may resolve with or without further involvement from your physician, physical therapist or athletic trainer. While completing these exercises, remember:   Restoring tissue flexibility helps normal motion to return to the joints. This allows healthier, less painful movement and activity.  An effective stretch should be held for at least 30 seconds.  A stretch should never be painful. You should only feel a gentle lengthening or release in the stretched tissue. RANGE OF MOTION - Toe Extension, Flexion  Sit with your right / left leg crossed over your opposite knee.  Grasp your toes and gently pull them back toward the top of your foot. You should feel a stretch on the bottom of your toes and/or foot.  Hold this stretch for __________ seconds.  Now, gently pull your toes toward the bottom of your foot. You should feel a stretch on the top of your toes and or foot.  Hold this stretch for __________ seconds. Repeat __________ times. Complete this stretch __________ times per day.  RANGE OF MOTION - Ankle Dorsiflexion, Active Assisted  Remove shoes and sit on a chair that is preferably not on a carpeted surface.  Place right / left foot under knee. Extend your opposite leg for support.  Keeping your heel down, slide your right / left foot back toward the chair until you feel a stretch at your ankle or calf. If you do not feel a stretch, slide your bottom forward to the edge of the chair, while still keeping your heel down.  Hold this stretch for __________ seconds. Repeat __________ times. Complete this stretch __________ times per day.  STRETCH - Gastroc, Standing  Place hands on wall.  Extend right / left leg, keeping the front knee somewhat bent.  Slightly point your toes inward on your back foot.  Keeping your right / left heel on the floor and your knee straight, shift your weight  toward the wall, not allowing your back to arch.  You should feel a gentle stretch in the right / left calf. Hold this position for __________ seconds. Repeat __________ times. Complete this stretch __________ times per day. STRETCH - Soleus, Standing  Place hands on wall.  Extend right / left leg, keeping the other knee somewhat bent.  Slightly point your toes inward on your back foot.  Keep your right / left heel on the floor, bend your back knee, and slightly shift your weight over the back leg so that you feel a gentle stretch deep in your back calf.  Hold this position for __________ seconds. Repeat __________ times. Complete this stretch __________ times per day. STRETCH - Gastrocsoleus, Standing  Note: This exercise can place a lot of stress on your foot and ankle. Please complete this exercise only if specifically instructed by your caregiver.   Place the ball of your right / left foot on a step, keeping your other foot firmly on the same step.  Hold on to the wall or a rail for balance.  Slowly lift your other foot, allowing your body weight to press your heel down over the edge of the step.  You should feel a stretch in your right / left calf.  Hold this position for __________ seconds.  Repeat this exercise with a slight bend in your right / left knee. Repeat __________  times. Complete this stretch __________ times per day.  STRENGTHENING EXERCISES - Plantar Fasciitis (Heel Spur Syndrome)  These exercises may help you when beginning to rehabilitate your injury. They may resolve your symptoms with or without further involvement from your physician, physical therapist or athletic trainer. While completing these exercises, remember:   Muscles can gain both the endurance and the strength needed for everyday activities through controlled exercises.  Complete these exercises as instructed by your physician, physical therapist or athletic trainer. Progress the resistance and  repetitions only as guided. STRENGTH - Towel Curls  Sit in a chair positioned on a non-carpeted surface.  Place your foot on a towel, keeping your heel on the floor.  Pull the towel toward your heel by only curling your toes. Keep your heel on the floor.  If instructed by your physician, physical therapist or athletic trainer, add ____________________ at the end of the towel. Repeat __________ times. Complete this exercise __________ times per day. STRENGTH - Ankle Inversion  Secure one end of a rubber exercise band/tubing to a fixed object (table, pole). Loop the other end around your foot just before your toes.  Place your fists between your knees. This will focus your strengthening at your ankle.  Slowly, pull your big toe up and in, making sure the band/tubing is positioned to resist the entire motion.  Hold this position for __________ seconds.  Have your muscles resist the band/tubing as it slowly pulls your foot back to the starting position. Repeat __________ times. Complete this exercises __________ times per day.  Document Released: 07/31/2005 Document Revised: 10/23/2011 Document Reviewed: 11/12/2008 [facility] Patient Information 2015 [place], [place]. This information is not intended to replace advice given to you by your health care provider. Make sure you discuss any questions you have with your health care provider.

## 2016-09-07 ENCOUNTER — Ambulatory Visit (HOSPITAL_COMMUNITY)
Admission: EM | Admit: 2016-09-07 | Discharge: 2016-09-07 | Disposition: A | Discharge status: Home / Self Care | Payer: BLUE CROSS/BLUE SHIELD | Attending: Family Medicine | Admitting: Family Medicine

## 2016-09-07 ENCOUNTER — Encounter (HOSPITAL_COMMUNITY): Payer: Self-pay | Admitting: Emergency Medicine

## 2016-09-07 DIAGNOSIS — J9801 Acute bronchospasm: Secondary | ICD-10-CM | POA: Diagnosis not present

## 2016-09-07 DIAGNOSIS — B9789 Other viral agents as the cause of diseases classified elsewhere: Secondary | ICD-10-CM

## 2016-09-07 DIAGNOSIS — H6502 Acute serous otitis media, left ear: Secondary | ICD-10-CM | POA: Diagnosis not present

## 2016-09-07 DIAGNOSIS — J069 Acute upper respiratory infection, unspecified: Secondary | ICD-10-CM | POA: Diagnosis not present

## 2016-09-07 DIAGNOSIS — R0982 Postnasal drip: Secondary | ICD-10-CM | POA: Diagnosis not present

## 2016-09-07 MED ORDER — ALBUTEROL SULFATE HFA 108 (90 BASE) MCG/ACT IN AERS: 2.0000 | INHALATION_SPRAY | RESPIRATORY_TRACT | 0 refills | Status: DC | PRN

## 2016-09-07 MED ORDER — PREDNISONE 50 MG PO TABS: ORAL_TABLET | ORAL | 0 refills | Status: DC

## 2016-09-07 MED ORDER — CEFUROXIME AXETIL 250 MG PO TABS: 250.0000 mg | ORAL_TABLET | Freq: Two times a day (BID) | ORAL | 0 refills | Status: DC

## 2016-09-07 NOTE — ED Provider Notes (Signed)
CSN: [id number]     Arrival date & time 09/07/16  1615 History   First MD Initiated Contact with Patient 09/07/16 1700     Chief Complaint  Patient presents with  . Sore Throat    two weeks  . Otalgia    two weeks  . Cough    two weeks   (Consider location/radiation/quality/duration/timing/severity/associated sxs/prior Treatment) 41 year old female complaining of a cough for over 2 weeks. She also feels like her ear is stopped up and uncomfortable. Whenever she is coughing she has anterior chest wall pain. [facility] and prescribed 10 days of penicillin. Her left ear started feeling a little better and a couple days ago her cough actually got worse in her ear continues to bother her. She states she has a history of asthma but is not bothered with that for a long time and she takes no medications. Denies fever. Denies chills. [facility] was prescribed doxycycline potentially for a respiratory infection. Patient is also taking pseudoephedrine and it is noted her heart rate is 118.      Past Medical History:  Diagnosis Date  . Asthma    no inhaler use x 15 yrs  . Complication of anesthesia    asthma attack in PACU  . Fibroids 2013   Past Surgical History:  Procedure Laterality Date  . ABDOMINAL HYSTERECTOMY N/A 06/24/2013   Procedure: HYSTERECTOMY ABDOMINAL;  Surgeon: Betsy Coder, MD;  Location: [facility];  Service: Gynecology;  Laterality: N/A;  . BILATERAL SALPINGECTOMY Bilateral 06/24/2013   Procedure: BILATERAL SALPINGECTOMY;  Surgeon: Betsy Coder, MD;  Location: [facility];  Service: Gynecology;  Laterality: Bilateral;  . CYSTOSCOPY N/A 06/24/2013   Procedure: CYSTOSCOPY;  Surgeon: Betsy Coder, MD;  Location: [facility];  Service: Gynecology;  Laterality: N/A;  . DILATION AND CURETTAGE OF UTERUS N/A 06/24/2013   Procedure: DILATATION AND CURETTAGE;  Surgeon: Betsy Coder, MD;   Location: [facility];  Service: Gynecology;  Laterality: N/A;  . GANGLION CYST EXCISION    . TUBAL LIGATION     Family History  Problem Relation Age of Onset  . Hypertension Maternal Grandfather   . Hypertension Mother    Social History  Substance Use Topics  . Smoking status: Never Smoker  . Smokeless tobacco: Never Used  . Alcohol use No   OB History    Gravida Para Term Preterm AB Living   3         3   SAB TAB Ectopic Multiple Live Births                 Review of Systems  Constitutional: Positive for activity change. Negative for appetite change, chills, fatigue and fever.  HENT: Positive for congestion, postnasal drip and rhinorrhea. Negative for facial swelling and sore throat.   Eyes: Negative.   Respiratory: Positive for cough and chest tightness.   Cardiovascular: Negative.   Gastrointestinal: Negative.   Musculoskeletal: Negative for neck pain and neck stiffness.  Skin: Negative for pallor and rash.  Neurological: Negative.   All other systems reviewed and are negative.   Allergies  Patient has no known allergies.  Home Medications   Prior to Admission medications   Medication Sig Start Date End Date Taking? Authorizing Provider  ibuprofen (ADVIL,MOTRIN) 800 MG tablet Take 1 tablet (800 mg total) by mouth 3 (three) times daily. 03/24/14  Yes Alroy Dust  H Baker, PA-C  albuterol (PROVENTIL HFA;VENTOLIN HFA) 108 (90 Base) MCG/ACT inhaler Inhale 2 puffs into the lungs every 4 (four) hours as needed for wheezing or shortness of breath. 09/07/16   Janne Napoleon, NP  cefUROXime (CEFTIN) 250 MG tablet Take 1 tablet (250 mg total) by mouth 2 (two) times daily with a meal. 09/07/16   Janne Napoleon, NP  naproxen (NAPROSYN) 500 MG tablet Take 1 tablet (500 mg total) by mouth 2 (two) times daily. 01/11/15   Gregor Hams, MD  predniSONE (DELTASONE) 50 MG tablet 1 tab po daily for 6 days. Take with food. 09/07/16   Janne Napoleon, NP   Meds Ordered and Administered this Visit  Medications - No  data to display  BP 127/74 (BP Location: Right Arm)   Pulse 118   Temp 100 F (37.8 C) (Oral)   LMP 05/19/2013   SpO2 96%  No data found.   Physical Exam  Constitutional: She is oriented to person, place, and time. She appears well-developed and well-nourished. No distress.  HENT:  Head: Normocephalic and atraumatic.  Right Ear: External ear normal.  Left Ear: External ear normal.  Oropharynx with minor erythema and clear PND otherwise clear. Right TM with minor retraction. Left TM distorted, the posterior half is erythematous. There are several vesicular lesions covering a portion of the TM. No drainage or bleeding is seen. The EAC is dry.  Eyes: EOM are normal. Pupils are equal, round, and reactive to light.  Neck: Normal range of motion. Neck supple.  Cardiovascular: Normal rate, regular rhythm, normal heart sounds and intact distal pulses.   Pulmonary/Chest: Effort normal and breath sounds normal. No respiratory distress. She exhibits no tenderness.  Musculoskeletal: Normal range of motion. She exhibits no edema.  Lymphadenopathy:    She has no cervical adenopathy.  Neurological: She is alert and oriented to person, place, and time.  Skin: Skin is warm and dry.  Psychiatric: She has a normal mood and affect. Her behavior is normal.  Nursing note and vitals reviewed.   Urgent Care Course     Procedures (including critical care time)  Labs Review Labs Reviewed - No data to display  Imaging Review No results found.   Visual Acuity Review  Right Eye Distance:   Left Eye Distance:   Bilateral Distance:    Right Eye Near:   Left Eye Near:    Bilateral Near:         MDM   1. Viral upper respiratory tract infection   2. PND (post-nasal drip)   3. Cough due to bronchospasm   4. Acute serous otitis media of left ear, recurrence not specified    The primary reason for your persistent cough is bronchospasm or wheezing. The drainage in the back of your throat  is probably contributing to this as well. The ear infection on the left has not completely healed. Recommend not taking the doxycycline as prescribed earlier today but take the new antibiotic. Also start using albuterol HFA 2 puffs every 4 hours for cough and wheeze. Start taking the prednisone as directed and take with food. Start taking the antihistamine Zyrtec or Allegra daily to help minimize the drainage the back of your throat. The shirt keep on drinking fluids and water in clearing her throat of the drainage as well as staying well-hydrated. Your heart rate is elevated a little and possibly due in part to dehydration and may be the decongestant you are taking. If you do not have  to take a decongestant do not take it. He may use saline nasal spray as often as you want. Meds ordered this encounter  Medications  . albuterol (PROVENTIL HFA;VENTOLIN HFA) 108 (90 Base) MCG/ACT inhaler    Sig: Inhale 2 puffs into the lungs every 4 (four) hours as needed for wheezing or shortness of breath.    Dispense:  1 Inhaler    Refill:  0    Order Specific Question:   Supervising Provider    Answer:   Robyn Haber [[id number]]  . predniSONE (DELTASONE) 50 MG tablet    Sig: 1 tab po daily for 6 days. Take with food.    Dispense:  6 tablet    Refill:  0    Order Specific Question:   Supervising Provider    Answer:   Robyn Haber [[id number]]  . cefUROXime (CEFTIN) 250 MG tablet    Sig: Take 1 tablet (250 mg total) by mouth 2 (two) times daily with a meal.    Dispense:  20 tablet    Refill:  0    Order Specific Question:   Supervising Provider    Answer:   Brayton Caves       Janne Napoleon, NP 09/07/16 1728

## 2016-09-07 NOTE — ED Triage Notes (Signed)
Pt has been suffering from a sore throat, cough, and left ear pain for two weeks.  She was prescribed an antibiotic two weeks and her symptoms did not go away.  She went back to the [facility] today and they prescribed her another antibiotic (she thinks doxycycline).  She was also prescribed a liquid cough medication that begins with an "F or Ph".  She was prescribed Tessalon Pearls initially.

## 2016-09-07 NOTE — Discharge Instructions (Signed)
The primary reason for your persistent cough is bronchospasm or wheezing. The drainage in the back of your throat is probably contributing to this as well. The ear infection on the left has not completely healed. Recommend not taking the doxycycline as prescribed earlier today but take the new antibiotic. Also start using albuterol HFA 2 puffs every 4 hours for cough and wheeze. Start taking the prednisone as directed and take with food. Start taking the antihistamine Zyrtec or Allegra daily to help minimize the drainage the back of your throat. The shirt keep on drinking fluids and water in clearing her throat of the drainage as well as staying well-hydrated. Your heart rate is elevated a little and possibly due in part to dehydration and may be the decongestant you are taking. If you do not have to take a decongestant do not take it. He may use saline nasal spray as often as you want.

## 2017-09-27 ENCOUNTER — Ambulatory Visit (INDEPENDENT_AMBULATORY_CARE_PROVIDER_SITE_OTHER): Payer: BLUE CROSS/BLUE SHIELD | Admitting: Physician Assistant

## 2017-10-17 ENCOUNTER — Other Ambulatory Visit: Payer: Self-pay

## 2017-10-17 ENCOUNTER — Ambulatory Visit (INDEPENDENT_AMBULATORY_CARE_PROVIDER_SITE_OTHER): Payer: Self-pay | Admitting: Physician Assistant

## 2017-10-17 ENCOUNTER — Encounter (INDEPENDENT_AMBULATORY_CARE_PROVIDER_SITE_OTHER): Payer: Self-pay | Admitting: Physician Assistant

## 2017-10-17 ENCOUNTER — Other Ambulatory Visit (HOSPITAL_COMMUNITY)
Admission: RE | Admit: 2017-10-17 | Discharge: 2017-10-17 | Disposition: A | Discharge status: Home / Self Care | Payer: Self-pay | Source: Ambulatory Visit | Attending: Physician Assistant | Admitting: Physician Assistant

## 2017-10-17 VITALS — BP 123/79 | HR 88 | Temp 98.3°F | Ht 65.0 in | Wt 275.8 lb

## 2017-10-17 DIAGNOSIS — Z111 Encounter for screening for respiratory tuberculosis: Secondary | ICD-10-CM

## 2017-10-17 DIAGNOSIS — Z6841 Body Mass Index (BMI) 40.0 and over, adult: Secondary | ICD-10-CM

## 2017-10-17 DIAGNOSIS — Z23 Encounter for immunization: Secondary | ICD-10-CM

## 2017-10-17 DIAGNOSIS — Z114 Encounter for screening for human immunodeficiency virus [HIV]: Secondary | ICD-10-CM

## 2017-10-17 DIAGNOSIS — Z0000 Encounter for general adult medical examination without abnormal findings: Secondary | ICD-10-CM

## 2017-10-17 DIAGNOSIS — Z124 Encounter for screening for malignant neoplasm of cervix: Secondary | ICD-10-CM

## 2017-10-17 DIAGNOSIS — Z131 Encounter for screening for diabetes mellitus: Secondary | ICD-10-CM

## 2017-10-17 NOTE — Progress Notes (Signed)
Subjective:  Patient ID: Anne Reese, female    DOB: 16-Oct-1975  Age: 42 y.o. MRN: [id number]  CC: physical  HPI Anne Reese is a 42 y.o. female with a medical history of asthma, fibroids, tobacco use, and plantar fasciitis presents as a new patient for an annual physical. Brings work physical form which asks questions about her immunization status. Patient does not know about her immunizations nor has she brought in records. Does not endorse any symptoms or complaints.     Outpatient Medications Prior to Visit  Medication Sig Dispense Refill  . albuterol (PROVENTIL HFA;VENTOLIN HFA) 108 (90 Base) MCG/ACT inhaler Inhale 2 puffs into the lungs every 4 (four) hours as needed for wheezing or shortness of breath. (Patient not taking: Reported on 10/17/2017) 1 Inhaler 0  . cefUROXime (CEFTIN) 250 MG tablet Take 1 tablet (250 mg total) by mouth 2 (two) times daily with a meal. (Patient not taking: Reported on 10/17/2017) 20 tablet 0  . ibuprofen (ADVIL,MOTRIN) 800 MG tablet Take 1 tablet (800 mg total) by mouth 3 (three) times daily. (Patient not taking: Reported on 10/17/2017) 60 tablet 0  . naproxen (NAPROSYN) 500 MG tablet Take 1 tablet (500 mg total) by mouth 2 (two) times daily. (Patient not taking: Reported on 10/17/2017) 30 tablet 0  . predniSONE (DELTASONE) 50 MG tablet 1 tab po daily for 6 days. Take with food. (Patient not taking: Reported on 10/17/2017) 6 tablet 0   No facility-administered medications prior to visit.      ROS Review of Systems  Constitutional: Negative for chills, fever and malaise/fatigue.  Eyes: Negative for blurred vision.  Respiratory: Negative for shortness of breath.   Cardiovascular: Negative for chest pain and palpitations.  Gastrointestinal: Negative for abdominal pain and nausea.  Genitourinary: Negative for dysuria and hematuria.  Musculoskeletal: Negative for joint pain and myalgias.  Skin: Negative for rash.  Neurological: Negative for tingling and  headaches.  Psychiatric/Behavioral: Negative for depression. The patient is not nervous/anxious.     Objective:  BP 123/79 (BP Location: Left Arm, Patient Position: Sitting, Cuff Size: Large)   Pulse 88   Temp 98.3 F (36.8 C) (Oral)   Ht 5\' 5"  (1.651 m)   Wt 275 lb 12.8 oz (125.1 kg)   LMP 05/19/2013   SpO2 92%   BMI 45.90 kg/m   BP/Weight 10/17/2017 09/07/2016 4/97/0263  Systolic BP 785 885 027  Diastolic BP 79 74 80  Wt. (Lbs) 275.8 - -  BMI 45.9 - -      Physical Exam  Constitutional: She is oriented to person, place, and time.  Well developed, well nourished, NAD, polite  HENT:  Head: Normocephalic and atraumatic.  Eyes: No scleral icterus.  Neck: Normal range of motion. Neck supple. No thyromegaly present.  Cardiovascular: Normal rate, regular rhythm and normal heart sounds.  Pulmonary/Chest: Effort normal and breath sounds normal.  Abdominal: Soft. Bowel sounds are normal. There is no tenderness.  Genitourinary:  Genitourinary Comments: Semi thick whitish vaginal discharge. No cervix. No adnexal TTP or mass felt.  Musculoskeletal: She exhibits no edema.  Neurological: She is alert and oriented to person, place, and time.  Skin: Skin is warm and dry. No rash noted. No erythema. No pallor.  Psychiatric: She has a normal mood and affect. Her behavior is normal. Thought content normal.  Vitals reviewed.    Assessment & Plan:    1. Annual physical exam - CBC with Differential; Future - Comprehensive metabolic panel; Future -  TSH; Future - Lipid panel; Future  2. Screening for diabetes mellitus - HgB A1c  3. Screening for HIV (human immunodeficiency virus) - HIV antibody; Future  4. Need for influenza vaccination - Flu Vaccine QUAD 6+ mos PF IM (Fluarix Quad PF)  5. Need for Tdap vaccination - Tdap vaccine greater than or equal to 7yo IM  6. Class 3 severe obesity without serious comorbidity with body mass index (BMI) of 45.0 to 49.9 in adult,  unspecified obesity type (HCC) - TSH  7. Screening for cervical cancer - Cytology - PAP([place])    Follow-up: Friday for PPD read.  Clent Demark PA

## 2017-10-17 NOTE — Patient Instructions (Addendum)
Please use Job code [place] on the [employer] for employment.  Try Cetaphil for dry skins.   Community Resources  Advocacy/Legal Legal Aid [place]:  [phone]  /  ([phone]  [place]:  [phone]  Family Service of the [facility] 24-hr Crisis line:  [phone]  [facility], [place]:  [phone]  [place] (custody):  [phone]  [facility]:   [phone]    Baby & Breastfeeding Car Seat Inspection @ Various [place].- call [place] Lactation  [phone]  [place] Lactation [phone]  [place]: [phone] ([place]);  [phone] ([place])  [place]:  [phone]   [place] Child Development: [phone] [facility]) / [phone] ([place])  - Child Care Resources/ Referrals/ Scholarships  - Head Start/ Early Head Start (call or apply online)  [place] DHHS: [place] Pre-K :  [phone] / [phone]   Employment / [employer]: [phone] / [place] ([place]): [phone] ([place]) / [phone] ([place])  [place]: [phone] / [phone]  [facility] Job [facility] Career Center: [phone]  DHHS Work First: [phone] ([place]) / [phone] ([place])  [place]:  [place]:  [phone]  [employer]: [place] (furniture):  [place] Helping Hands: [phone]  [place]  [place]- SNAP/ Food Stamps: [phone]  [place]: Letta [phone] ;  [place] [phone]  [place]  During the summer, text "FOOD" to [place] / Clinics (Adults) [place] (for Adults) through [facility]: [phone]  [place]:   [place]:   [phone]  Health Department:  [place]:  [phone] / [phone]  [facility] [place]:   [phone]  [facility]:   [phone] x IXL:   [place]:  [place]:  [phone]   [place] for [facility]):  [phone]  [employer] [facility]:  [place]:  [place]:  [place]:  [place]  www.youthsafegso.org  PFLAG  (406)184-5968 / info@[place] .Dorothea Glassman Project:  985-239-4990   Mental Health/ Substance Use Family Service of the [employer]  [place]:  [phone] or [phone]  [facility]:  [phone]  Journeys Counseling:  [place]:  [phone]  [place] Sessions (walk-ins)  [phone] / [place]:  [phone]  Alcoholics Anonymous:  [phone]  Narcotics Anonymous:  [phone]  Quit Smoking Hotline:  800-QUIT-NOW 626-581-0010)   Parenting [place]:  [place]:  [phone]  [employer]WCA: [phone]  [employer]: Bringing Out the Best:  [phone]               Thriving at Three (Hispanic families): [phone]  Healthy Start ([place]):  [phone]  Parents as Teachers:  [place] Together (Immigrants): [phone]   Poison Control 6155599635  [place] Open Doors Application: ImDemand.es  [place] of [place]: http://www.[place].gov/index.aspx?page=3615   Special Needs Family Support Network:  (418)383-0680  Autism Society of [place]:   [phone] or ([phone] /  [place]:  [phone]  [place]:  [place] ([place]):  [phone]  [facility] (Care Coordination for Children):  [phone]   Transportation Medicaid Transportation: 260-107-6867 to apply  [place]: 802-343-2897 (reduced-fare bus ID to [place])  SCAT Paratransit services: Eligible riders only, call [phone] for application   Tutoring/Mentoring [place]: [place]: [phone] ([place])  ACES through child's school: [place]: contact your local [place] Program: [phone]

## 2017-10-18 LAB — CYTOLOGY - PAP
Bacterial vaginitis: NEGATIVE
Candida vaginitis: NEGATIVE
Chlamydia: NEGATIVE
DIAGNOSIS: NEGATIVE
Neisseria Gonorrhea: NEGATIVE
Trichomonas: NEGATIVE

## 2017-10-19 ENCOUNTER — Telehealth (INDEPENDENT_AMBULATORY_CARE_PROVIDER_SITE_OTHER): Payer: Self-pay

## 2017-10-19 ENCOUNTER — Other Ambulatory Visit (INDEPENDENT_AMBULATORY_CARE_PROVIDER_SITE_OTHER): Payer: Self-pay

## 2017-10-19 ENCOUNTER — Telehealth: Payer: Self-pay | Admitting: Licensed Clinical Social Worker

## 2017-10-19 DIAGNOSIS — E66813 Obesity, class 3: Secondary | ICD-10-CM

## 2017-10-19 DIAGNOSIS — Z114 Encounter for screening for human immunodeficiency virus [HIV]: Secondary | ICD-10-CM

## 2017-10-19 DIAGNOSIS — Z0000 Encounter for general adult medical examination without abnormal findings: Secondary | ICD-10-CM

## 2017-10-19 DIAGNOSIS — Z6841 Body Mass Index (BMI) 40.0 and over, adult: Secondary | ICD-10-CM

## 2017-10-19 LAB — TB SKIN TEST
Induration: 3 mm
TB Skin Test: NEGATIVE

## 2017-10-19 NOTE — Telephone Encounter (Signed)
LCSWA contacted pt per consult from PA-C Gomez. Pt scheduled appt for 10/22/17.

## 2017-10-19 NOTE — Telephone Encounter (Signed)
-----   Message from Clent Demark, PA-C sent at 10/18/2017  5:52 PM EST ----- PAP completely negative.

## 2017-10-19 NOTE — Telephone Encounter (Signed)
Patient aware of negative pap. Nat Christen, CMA

## 2017-10-20 LAB — COMPREHENSIVE METABOLIC PANEL
A/G RATIO: 1.1 — AB (ref 1.2–2.2)
ALBUMIN: 3.8 g/dL (ref 3.5–5.5)
ALK PHOS: 81 IU/L (ref 39–117)
ALT: 10 IU/L (ref 0–32)
AST: 12 IU/L (ref 0–40)
BUN / CREAT RATIO: 10 (ref 9–23)
BUN: 7 mg/dL (ref 6–24)
Bilirubin Total: 0.6 mg/dL (ref 0.0–1.2)
CO2: 22 mmol/L (ref 20–29)
Calcium: 9.2 mg/dL (ref 8.7–10.2)
Chloride: 105 mmol/L (ref 96–106)
Creatinine, Ser: 0.68 mg/dL (ref 0.57–1.00)
GFR calc Af Amer: 126 mL/min/{1.73_m2} (ref >59)
GFR, EST NON AFRICAN AMERICAN: 109 mL/min/{1.73_m2} (ref >59)
GLOBULIN, TOTAL: 3.4 g/dL (ref 1.5–4.5)
Glucose: 104 mg/dL — ABNORMAL HIGH (ref 65–99)
POTASSIUM: 4 mmol/L (ref 3.5–5.2)
Sodium: 143 mmol/L (ref 134–144)
Total Protein: 7.2 g/dL (ref 6.0–8.5)

## 2017-10-20 LAB — CBC WITH DIFFERENTIAL/PLATELET
BASOS: 1 %
Basophils Absolute: 0 10*3/uL (ref 0.0–0.2)
EOS (ABSOLUTE): 0.1 10*3/uL (ref 0.0–0.4)
Eos: 2 %
Hematocrit: 39.1 % (ref 34.0–46.6)
Hemoglobin: 12.7 g/dL (ref 11.1–15.9)
Immature Grans (Abs): 0 10*3/uL (ref 0.0–0.1)
Immature Granulocytes: 0 %
LYMPHS ABS: 2.2 10*3/uL (ref 0.7–3.1)
Lymphs: 34 %
MCH: 24.4 pg — AB (ref 26.6–33.0)
MCHC: 32.5 g/dL (ref 31.5–35.7)
MCV: 75 fL — AB (ref 79–97)
MONOS ABS: 0.5 10*3/uL (ref 0.1–0.9)
Monocytes: 8 %
Neutrophils Absolute: 3.5 10*3/uL (ref 1.4–7.0)
Neutrophils: 55 %
Platelets: 351 10*3/uL (ref 150–379)
RBC: 5.2 x10E6/uL (ref 3.77–5.28)
RDW: 15.7 % — ABNORMAL HIGH (ref 12.3–15.4)
WBC: 6.3 10*3/uL (ref 3.4–10.8)

## 2017-10-20 LAB — LIPID PANEL
CHOLESTEROL TOTAL: 186 mg/dL (ref 100–199)
Chol/HDL Ratio: 5.3 ratio — ABNORMAL HIGH (ref 0.0–4.4)
HDL: 35 mg/dL — ABNORMAL LOW (ref >39)
LDL Calculated: 132 mg/dL — ABNORMAL HIGH (ref 0–99)
TRIGLYCERIDES: 93 mg/dL (ref 0–149)
VLDL Cholesterol Cal: 19 mg/dL (ref 5–40)

## 2017-10-20 LAB — HIV ANTIBODY (ROUTINE TESTING W REFLEX): HIV Screen 4th Generation wRfx: NONREACTIVE

## 2017-10-20 LAB — TSH: TSH: 1.8 u[IU]/mL (ref 0.450–4.500)

## 2017-10-22 ENCOUNTER — Other Ambulatory Visit (INDEPENDENT_AMBULATORY_CARE_PROVIDER_SITE_OTHER): Payer: Self-pay | Admitting: Physician Assistant

## 2017-10-22 ENCOUNTER — Telehealth (INDEPENDENT_AMBULATORY_CARE_PROVIDER_SITE_OTHER): Payer: Self-pay

## 2017-10-22 ENCOUNTER — Institutional Professional Consult (permissible substitution): Payer: Self-pay | Admitting: Licensed Clinical Social Worker

## 2017-10-22 DIAGNOSIS — E7841 Elevated Lipoprotein(a): Secondary | ICD-10-CM

## 2017-10-22 MED ORDER — ATORVASTATIN CALCIUM 40 MG PO TABS: 40.0000 mg | ORAL_TABLET | Freq: Every day | ORAL | 3 refills | Status: DC

## 2017-10-22 MED ORDER — ATORVASTATIN CALCIUM 40 MG PO TABS
40.0000 mg | ORAL_TABLET | Freq: Every day | ORAL | 3 refills | Status: DC
Start: 2017-10-22 — End: 2017-10-22

## 2017-10-22 NOTE — Telephone Encounter (Signed)
Patient called and stated that [facility]. Please send to [place] on [address] as it is on the $4 list there. Nat Christen, CMA

## 2017-10-22 NOTE — Telephone Encounter (Signed)
-----   Message from Clent Demark, PA-C sent at 10/22/2017  1:52 PM EDT ----- HIV negative. Cholesterol elevated. Rest of labs normal/unremarkable. I [facility] to [facility] [address]. Do not take atorvastatin if there is ever a suspicion of pregnancy.

## 2017-10-22 NOTE — Telephone Encounter (Signed)
Sent to [facility] on [address]. Thank you.

## 2017-10-22 NOTE — Telephone Encounter (Signed)
Patient is aware of negative HIV and elevated cholesterol. Patient aware that atorvastatin sent to [facility], and to not take if she suspects she is pregnant. Nat Christen, CMA

## 2017-10-23 NOTE — Telephone Encounter (Signed)
Left patient a voicemail informing patient that medication had been sent to [facility]. Nat Christen, CMA

## 2018-06-25 DIAGNOSIS — M722 Plantar fascial fibromatosis: Secondary | ICD-10-CM | POA: Insufficient documentation

## 2019-05-03 DIAGNOSIS — K5792 Diverticulitis of intestine, part unspecified, without perforation or abscess without bleeding: Secondary | ICD-10-CM | POA: Insufficient documentation

## 2019-07-16 ENCOUNTER — Other Ambulatory Visit: Payer: Self-pay

## 2019-07-16 ENCOUNTER — Ambulatory Visit (INDEPENDENT_AMBULATORY_CARE_PROVIDER_SITE_OTHER): Payer: Self-pay | Admitting: Primary Care

## 2019-07-16 ENCOUNTER — Encounter (INDEPENDENT_AMBULATORY_CARE_PROVIDER_SITE_OTHER): Payer: Self-pay | Admitting: Primary Care

## 2019-07-16 DIAGNOSIS — Z8719 Personal history of other diseases of the digestive system: Secondary | ICD-10-CM

## 2019-07-16 DIAGNOSIS — R109 Unspecified abdominal pain: Secondary | ICD-10-CM

## 2019-07-16 DIAGNOSIS — K5790 Diverticulosis of intestine, part unspecified, without perforation or abscess without bleeding: Secondary | ICD-10-CM

## 2019-07-16 DIAGNOSIS — F4323 Adjustment disorder with mixed anxiety and depressed mood: Secondary | ICD-10-CM

## 2019-07-16 DIAGNOSIS — E7841 Elevated Lipoprotein(a): Secondary | ICD-10-CM

## 2019-07-27 NOTE — Progress Notes (Signed)
Virtual Visit via Telephone Note  I connected with [name] on 07/27/19 at  3:10 PM EST by telephone and verified that I am speaking with the correct person using two identifiers.   I discussed the limitations, risks, security and privacy concerns of performing an evaluation and management service by telephone and the availability of in person appointments. I also discussed with the patient that there may be a patient responsible charge related to this service. The patient expressed understanding and agreed to proceed.   History of Present Illness: Anne Reese is having a tele visit to establish care with new PCP but established with [facility]. She is having abdominal pain and a history of diverticulitis and requesting a referral to gastrology.  . Past Medical History:  Diagnosis Date  . Asthma    no inhaler use x 15 yrs  . Complication of anesthesia    asthma attack in PACU  . Fibroids 2013   Current Outpatient Medications on File Prior to Visit  Medication Sig Dispense Refill  . atorvastatin (LIPITOR) 40 MG tablet Take 1 tablet (40 mg total) by mouth daily. 90 tablet 3  . FLUoxetine (PROZAC) 40 MG capsule Take 40 mg by mouth daily.     No current facility-administered medications on file prior to visit.      Observations/Objective: New Patient (Initial Visit) referral to gastro   Diverticulitis   Anxiety   Depression      Assessment and Plan: Corrina was seen today for new patient (initial visit), diverticulitis, anxiety and depression.  Diagnoses and all orders for this visit:  Elevated lipoprotein(a) History of last labs taken outside of practice not taking cholesterol at this time.   Diverticulosis Explain the difference with active problem and history .Monitor foods that can cause pain from pouches in the colon -popcorn, seeds, corn ,nuts -     Ambulatory referral to Gastroenterology  Adjustment disorder with mixed anxiety and depressed mood PHQ 14 previously  taking Prozac. Refer to clinical Education officer, museum. Explained depression can affect thoughts, feelings & sleep. Can cause changes in mood ,relationships, daily activities & physical health. Never suddenly stop taking depressant or anxiety medications will cause adverse affects  Follow Up Instructions:    I discussed the assessment and treatment plan with the patient. The patient was provided an opportunity to ask questions and all were answered. The patient agreed with the plan and demonstrated an understanding of the instructions.   The patient was advised to call back or seek an in-person evaluation if the symptoms worsen or if the condition fails to improve as anticipated.  I provided 20 minutes of non-face-to-face time during this encounter.   Kerin Perna, NP

## 2019-08-05 ENCOUNTER — Ambulatory Visit: Payer: PRIVATE HEALTH INSURANCE | Attending: Internal Medicine

## 2019-08-05 DIAGNOSIS — Z20822 Contact with and (suspected) exposure to covid-19: Secondary | ICD-10-CM

## 2019-08-05 DIAGNOSIS — Z20828 Contact with and (suspected) exposure to other viral communicable diseases: Secondary | ICD-10-CM | POA: Insufficient documentation

## 2019-08-06 LAB — NOVEL CORONAVIRUS, NAA: SARS-CoV-2, NAA: NOT DETECTED

## 2019-08-21 ENCOUNTER — Telehealth (INDEPENDENT_AMBULATORY_CARE_PROVIDER_SITE_OTHER): Payer: Self-pay

## 2019-08-21 NOTE — Telephone Encounter (Signed)
Patient came into clinic to sign a medical release of information. Patient wanted to inform PCP that she stayed at a hospital in [place] for 7 days for Diverticulitis and feels her pain come back on the right side. Patient would like a call back from PCP.  Please advice [phone]  Thank you Whitney Post

## 2019-08-21 NOTE — Telephone Encounter (Signed)
Please advise 

## 2019-08-21 NOTE — Telephone Encounter (Signed)
Call patient she stated she ate grains and her stomach was now hurting. Asked was she given a list of foods that she was to try to stay away from she answered yes but it was just a little bit. Refer to information provided for management of diverticulitis. If becomes any worst proceed to the urgent care or hospital.

## 2019-08-27 ENCOUNTER — Other Ambulatory Visit: Payer: Self-pay

## 2019-08-27 ENCOUNTER — Ambulatory Visit (INDEPENDENT_AMBULATORY_CARE_PROVIDER_SITE_OTHER): Payer: PRIVATE HEALTH INSURANCE | Admitting: Primary Care

## 2019-08-27 ENCOUNTER — Encounter (INDEPENDENT_AMBULATORY_CARE_PROVIDER_SITE_OTHER): Payer: Self-pay | Admitting: Primary Care

## 2019-08-27 DIAGNOSIS — K5790 Diverticulosis of intestine, part unspecified, without perforation or abscess without bleeding: Secondary | ICD-10-CM

## 2019-08-27 DIAGNOSIS — N3946 Mixed incontinence: Secondary | ICD-10-CM

## 2019-08-27 NOTE — Progress Notes (Signed)
Pt states that when she coughs or sneezing she passes urine and sometimes bowel Pt complains of burning sensation in stomach when she wakes up in the mornings

## 2019-08-27 NOTE — Progress Notes (Signed)
Virtual Visit via Telephone Note  I connected with Anne Reese on 08/27/19 at  9:10 AM EST by telephone and verified that I am speaking with the correct person using two identifiers.   I discussed the limitations, risks, security and privacy concerns of performing an evaluation and management service by telephone and the availability of in person appointments. I also discussed with the patient that there may be a patient responsible charge related to this service. The patient expressed understanding and agreed to proceed.   History of Present Illness: [name] was having a tele visit for uncontrol urination with cough, sneeze , and laugh since September 2020 when hospital in [place] for treatment of diverticulitis. Sometime in November she would urinate/feeces expel at the same time cough, sneeze , and laugh. Not sexually active greater than greater 17 years.  Past Medical History:  Diagnosis Date  . Asthma    no inhaler use x 15 yrs  . Complication of anesthesia    asthma attack in PACU  . Fibroids 2013   Current Outpatient Medications on File Prior to Visit  Medication Sig Dispense Refill  . atorvastatin (LIPITOR) 40 MG tablet Take 1 tablet (40 mg total) by mouth daily. (Patient not taking: Reported on 08/27/2019) 90 tablet 3  . FLUoxetine (PROZAC) 40 MG capsule Take 40 mg by mouth daily.     No current facility-administered medications on file prior to visit.   Observations/Objective: Review of Systems  Gastrointestinal:       Incontinence of feces    Genitourinary:       Incontinent of urine  All other systems reviewed and are negative.   Assessment and Plan: .[name] was seen today for bladder leak.  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 The patient was provided an opportunity to ask questions and all were answered. The patient agreed with the plan and demonstrated an understanding of the instructions.   The patient was advised to call back or seek an in-person evaluation if the symptoms worsen or if the condition fails to improve as anticipated.  I provided 12 minutes of non-face-to-face time during this encounter.   Kerin Perna, NP

## 2019-09-22 ENCOUNTER — Ambulatory Visit: Payer: Self-pay | Admitting: Urology

## 2019-09-22 ENCOUNTER — Encounter: Payer: Self-pay | Admitting: Urology

## 2019-12-22 ENCOUNTER — Encounter: Payer: Self-pay | Admitting: Nurse Practitioner

## 2019-12-22 ENCOUNTER — Other Ambulatory Visit: Payer: Self-pay

## 2019-12-22 ENCOUNTER — Ambulatory Visit: Payer: PRIVATE HEALTH INSURANCE | Attending: Nurse Practitioner | Admitting: Nurse Practitioner

## 2019-12-22 ENCOUNTER — Other Ambulatory Visit: Payer: Self-pay | Admitting: Nurse Practitioner

## 2019-12-22 VITALS — Ht 63.0 in | Wt 236.0 lb

## 2019-12-22 DIAGNOSIS — K5909 Other constipation: Secondary | ICD-10-CM

## 2019-12-22 DIAGNOSIS — H538 Other visual disturbances: Secondary | ICD-10-CM

## 2019-12-22 DIAGNOSIS — Z7689 Persons encountering health services in other specified circumstances: Secondary | ICD-10-CM | POA: Diagnosis not present

## 2019-12-22 DIAGNOSIS — F17200 Nicotine dependence, unspecified, uncomplicated: Secondary | ICD-10-CM

## 2019-12-22 DIAGNOSIS — E559 Vitamin D deficiency, unspecified: Secondary | ICD-10-CM

## 2019-12-22 DIAGNOSIS — M722 Plantar fascial fibromatosis: Secondary | ICD-10-CM

## 2019-12-22 DIAGNOSIS — F32A Depression, unspecified: Secondary | ICD-10-CM

## 2019-12-22 DIAGNOSIS — K5900 Constipation, unspecified: Secondary | ICD-10-CM

## 2019-12-22 DIAGNOSIS — E7841 Elevated Lipoprotein(a): Secondary | ICD-10-CM

## 2019-12-22 MED ORDER — VITAMIN D (ERGOCALCIFEROL) 1.25 MG (50000 UNIT) PO CAPS: 50000.0000 [IU] | ORAL_CAPSULE | ORAL | 1 refills | Status: DC

## 2019-12-22 MED ORDER — FLUOXETINE HCL 40 MG PO CAPS: 40.0000 mg | ORAL_CAPSULE | Freq: Every day | ORAL | 1 refills | Status: DC

## 2019-12-22 MED ORDER — ATORVASTATIN CALCIUM 40 MG PO TABS: 40.0000 mg | ORAL_TABLET | Freq: Every day | ORAL | 3 refills | Status: DC

## 2019-12-22 NOTE — Progress Notes (Signed)
Virtual Visit via Telephone Note Due to national recommendations of social distancing due to [place] 19, telehealth visit is felt to be most appropriate for this patient at this time.  I discussed the limitations, risks, security and privacy concerns of performing an evaluation and management service by telephone and the availability of in person appointments. I also discussed with the patient that there may be a patient responsible charge related to this service. The patient expressed understanding and agreed to proceed.    I connected with Anne Reese on 12/22/19  at   1:50 PM EDT  EDT by telephone and verified that I am speaking with the correct person using two identifiers.   Consent I discussed the limitations, risks, security and privacy concerns of performing an evaluation and management service by telephone and the availability of in person appointments. I also discussed with the patient that there may be a patient responsible charge related to this service. The patient expressed understanding and agreed to proceed.   Location of Patient: Private Residence   Location of Provider: [place] and [employer] Office    Persons participating in Telemedicine visit: Geryl Rankins FNP-BC [place]    History of Present Illness: Telemedicine visit for: [name] Maintenance Last Mammogram: "years ago" Last PAP smear: 10-2017  She has a history of bilateral plantar fasciitis and pronation of both feet. She currently has moderate callouses on both feet that need to be evaluated and she also endorses pain with walking. She had right plantar fasciotomy in 2019 but could not afford the surgery for the left foot.   She does not have a history of HTN, DM, or thyroid disorder.  BP Readings from Last 3 Encounters:  12/23/19 (!) 166/102  10/17/17 123/79  09/07/16 127/74   Chronic Constipation She states she saw a Copywriter, advertising in the past in [facility] briefly. She was prescribed Miralax and 2 stool softeners initially however she is currently not taking the Miralax as she ran out. She is currently only having a bowel movement once a week. She states she has bowel movements 2-3 times a week when she does use the miralax.    She has a history of Vitamin D deficiency and has taken Vitamin D in the past.   Anxiety and Depression She has taken prozac in the past. Will restart today. Does not endorse any current thoughts of self harm. Previously took [place] for insomnia.  Depression screen [facility] 2/9 12/22/2019 08/27/2019 07/16/2019 10/17/2017  Decreased Interest 3 0 1 2  Down, Depressed, Hopeless 3 3 3 2   PHQ - 2 Score 6 3 4 4   Altered sleeping 0 0 1 1  Tired, decreased energy 1 2 2 1   Change in appetite 0 3 3 3   Feeling bad or failure about yourself  1 2 3 3   Trouble concentrating 1 0 3 3  Moving slowly or fidgety/restless 0 1 0 2  Suicidal thoughts 0 0 0 1  PHQ-9 Score 9 11 16 18   Difficult doing work/chores - Somewhat difficult - -    GAD 7 : Generalized Anxiety Score 12/22/2019 08/27/2019 07/16/2019 10/17/2017  Nervous, Anxious, on Edge 3 3 3 3   Control/stop worrying 2 1 1 3   Worry too much - different things 2 3 2 3   Trouble relaxing 1 0 2 1  Restless 1 0 0 1  Easily annoyed or irritable 1 2 2 3   Afraid - awful might happen 1  3 3 3   Total GAD 7 Score 11 12 13 17   Anxiety Difficulty - Somewhat difficult - -     Past Medical History:  Diagnosis Date  . Asthma    no inhaler use x 15 yrs  . Complication of anesthesia    asthma attack in PACU  . Fibroids 2013  . Hyperlipidemia     Past Surgical History:  Procedure Laterality Date  . ABDOMINAL HYSTERECTOMY N/A 06/24/2013   Procedure: HYSTERECTOMY ABDOMINAL;  Surgeon: Betsy Coder, MD;  Location: [place] [facility];  Service: Gynecology;  Laterality: N/A;  . BILATERAL SALPINGECTOMY Bilateral 06/24/2013   Procedure: BILATERAL SALPINGECTOMY;  Surgeon: Betsy Coder, MD;  Location: [place]  [facility];  Service: Gynecology;  Laterality: Bilateral;  . CYSTOSCOPY N/A 06/24/2013   Procedure: CYSTOSCOPY;  Surgeon: Betsy Coder, MD;  Location: [place] [facility];  Service: Gynecology;  Laterality: N/A;  . DILATION AND CURETTAGE OF UTERUS N/A 06/24/2013   Procedure: DILATATION AND CURETTAGE;  Surgeon: Betsy Coder, MD;  Location: [place] [facility];  Service: Gynecology;  Laterality: N/A;  . GANGLION CYST EXCISION    . TUBAL LIGATION      Family History  Problem Relation Age of Onset  . Hypertension Maternal Grandfather   . Hypertension Mother     Social History   Socioeconomic History  . Marital status: Single    Spouse name: Not on file  . Number of children: Not on file  . Years of education: Not on file  . Highest education level: Not on file  Occupational History  . Not on file  Tobacco Use  . Smoking status: Current Every Day Smoker    Types: Cigarettes  . Smokeless tobacco: Never Used  Substance and Sexual Activity  . Alcohol use: No  . Drug use: No  . Sexual activity: Yes    Partners: Female    Birth control/protection: Surgical    Comment: monogamous, 4 years, no toys  Other Topics Concern  . Not on file  Social History Narrative  . Not on file   Social Determinants of Health   Financial Resource Strain:   . Difficulty of Paying Living Expenses:   Food Insecurity:   . Worried About Charity fundraiser in the Last Year:   . Arboriculturist in the Last Year:   Transportation Needs:   . Film/video editor (Medical):   Marland Kitchen Lack of Transportation (Non-Medical):   Physical Activity:   . Days of Exercise per Week:   . Minutes of Exercise per Session:   Stress:   . Feeling of Stress :   Social Connections:   . Frequency of Communication with Friends and Family:   . Frequency of Social Gatherings with Friends and Family:   . Attends Religious Services:   . Active Member of Clubs or Organizations:   . Attends Archivist Meetings:   Marland Kitchen Marital Status:       Observations/Objective: Awake, alert and oriented x 3   Review of Systems  Constitutional: Negative for fever, malaise/fatigue and weight loss.  HENT: Negative.  Negative for nosebleeds.   Eyes: Positive for blurred vision. Negative for double vision and photophobia.  Respiratory: Negative.  Negative for cough and shortness of breath.   Cardiovascular: Negative.  Negative for chest pain, palpitations and leg swelling.  Gastrointestinal: Positive for constipation. Negative for heartburn, nausea and vomiting.       Blurred vision   Musculoskeletal: Positive for joint pain. Negative for myalgias.  SEE HPI  Neurological: Negative.  Negative for dizziness, focal weakness, seizures and headaches.  Psychiatric/Behavioral: Positive for depression. Negative for suicidal ideas. The patient is nervous/anxious.     Assessment and Plan: Derra was seen today for new patient (initial visit).  Diagnoses and all orders for this visit:  Encounter to establish care -     Hemoglobin A1c; Future -     CBC with Differential; Future -     CMP14+EGFR; Future -     Lipid Panel; Future -     Lipid Panel -     CMP14+EGFR -     CBC with Differential -     Hemoglobin A1c  Plantar fasciitis, bilateral -     Ambulatory referral to Podiatry  Chronic constipation -     Ambulatory referral to Gastroenterology  Tobacco dependence Icela is aware of the dangers of tobacco use and is not ready to quit.   Elevated lipoprotein(a) -     atorvastatin (LIPITOR) 40 MG tablet; Take 1 tablet (40 mg total) by mouth daily. INSTRUCTIONS: Work on a low fat, heart healthy diet and participate in regular aerobic exercise program by working out at least 150 minutes per week; 5 days a week-30 minutes per day. Avoid red meat/beef/steak,  fried foods. junk foods, sodas, sugary drinks, unhealthy snacking, alcohol and smoking.  Drink at least 80 oz of water per day and monitor your carbohydrate intake daily.     Blurred vision, bilateral -     Ambulatory referral to Ophthalmology  Anxiety and depression -     FLUoxetine (PROZAC) 40 MG capsule; Take 1 capsule (40 mg total) by mouth daily.  Vit D Deficiency -     Vitamin D, Ergocalciferol, (DRISDOL) 1.25 MG (50000 UNIT) CAPS capsule; Take 1 capsule (50,000 Units total) by mouth every 7 (seven) days.     Follow Up Instructions Return in about 8 weeks (around 02/16/2020).     I discussed the assessment and treatment plan with the patient. The patient was provided an opportunity to ask questions and all were answered. The patient agreed with the plan and demonstrated an understanding of the instructions.   The patient was advised to call back or seek an in-person evaluation if the symptoms worsen or if the condition fails to improve as anticipated.  I provided 18 minutes of non-face-to-face time during this encounter including median intraservice time, reviewing previous notes, labs, imaging, medications and explaining diagnosis and management.  Gildardo Pounds, FNP-BC

## 2019-12-23 ENCOUNTER — Encounter (HOSPITAL_COMMUNITY): Payer: Self-pay

## 2019-12-23 ENCOUNTER — Ambulatory Visit (HOSPITAL_COMMUNITY)
Admission: EM | Admit: 2019-12-23 | Discharge: 2019-12-23 | Disposition: A | Discharge status: Home / Self Care | Payer: PRIVATE HEALTH INSURANCE | Attending: Physician Assistant | Admitting: Physician Assistant

## 2019-12-23 ENCOUNTER — Other Ambulatory Visit: Payer: Self-pay

## 2019-12-23 DIAGNOSIS — J029 Acute pharyngitis, unspecified: Secondary | ICD-10-CM

## 2019-12-23 DIAGNOSIS — K047 Periapical abscess without sinus: Secondary | ICD-10-CM | POA: Diagnosis not present

## 2019-12-23 MED ORDER — IBUPROFEN 800 MG PO TABS
800.0000 mg | ORAL_TABLET | Freq: Three times a day (TID) | ORAL | 0 refills | Status: DC
Start: 2019-12-23 — End: 2020-02-12

## 2019-12-23 MED ORDER — KETOROLAC TROMETHAMINE 60 MG/2ML IM SOLN
60.0000 mg | Freq: Once | INTRAMUSCULAR | Status: AC
  Administered 2019-12-23: 60 mg via INTRAMUSCULAR

## 2019-12-23 MED ORDER — AMOXICILLIN-POT CLAVULANATE 400-57 MG/5ML PO SUSR: 875.0000 mg | Freq: Two times a day (BID) | ORAL | 0 refills | Status: DC

## 2019-12-23 MED ORDER — KETOROLAC TROMETHAMINE 60 MG/2ML IM SOLN
INTRAMUSCULAR | Status: AC
  Filled 2019-12-23: qty 2

## 2019-12-23 MED ORDER — IBUPROFEN 800 MG PO TABS
800.0000 mg | ORAL_TABLET | Freq: Three times a day (TID) | ORAL | 0 refills | Status: DC
Start: 2019-12-23 — End: 2019-12-23

## 2019-12-23 MED ORDER — AMOXICILLIN-POT CLAVULANATE 400-57 MG/5ML PO SUSR: 875.0000 mg | Freq: Two times a day (BID) | ORAL | 0 refills | Status: AC

## 2019-12-23 MED ORDER — LIDOCAINE HCL (PF) 1 % IJ SOLN
INTRAMUSCULAR | Status: AC
  Filled 2019-12-23: qty 2

## 2019-12-23 MED ORDER — CEFTRIAXONE SODIUM 1 G IJ SOLR
INTRAMUSCULAR | Status: AC
  Filled 2019-12-23: qty 10

## 2019-12-23 MED ORDER — CEFTRIAXONE SODIUM 1 G IJ SOLR
1.0000 g | Freq: Once | INTRAMUSCULAR | Status: AC
  Administered 2019-12-23: 1 g via INTRAMUSCULAR

## 2019-12-23 NOTE — ED Triage Notes (Addendum)
Patient reports left side jaw pain. Reports that she believes it is from where she had a tooth extracted last year. States it feels like there is still something in there, and now her ear, jaw, throat, and left side of the face hurt. Patient took 800mg  Tylenol prior to coming in, and reports it has helped with pain. Refused COVID testing.

## 2019-12-23 NOTE — Discharge Instructions (Addendum)
Start the Augmentin tonight as prescribed Continue the ibuprofen.  If you feel that you cannot tolerate liquids, have worsening swelling or inability to swallow, you need to go to the emergency department. Schedule follow-up with your dentist.  If not improved significantly in 48 hours please return or report to the emergency department.

## 2019-12-23 NOTE — ED Provider Notes (Addendum)
[facility]    CSN: [id number] Arrival date & time: 12/23/19  1926      History   Chief Complaint Chief Complaint  Patient presents with  . Dental Pain  . Sore Throat    HPI Anne Reese is a 44 y.o. female.   Patient reports for evaluation of left-sided jaw pain with facial swelling.  She reports this began about 4 days ago when she began having pain location of a tooth that she had it extracted a few years ago.  She reports sharp pain when chewing on the side.  This is gradually progressed to having pain underneath the left side of her jaw.  She noticed the swelling in her jaw today.  She reports this morning it hurt to open her mouth and it hurt to swallow pills.  She reports she was able to swallow pills and drink liquids and it just hurt.  She continues to endorse a sore throat.  Denies feeling that her throat is swelling shut or difficulty breathing.  She reports she was able to get down 800 mg ibuprofen has had some improvement in the pain and has been able  to open her mouth significantly more throughout the day.  She does report that her voice has been a little hoarse and raspy and this is not usual for her.  It does not hurt to talk.  She denies any foul taste in her mouth.  Denies any swelling under her tongue.  She denies fevers but endorses some chills last night.   Denies cough, runny nose, headache, nausea or vomiting.     Past Medical History:  Diagnosis Date  . Asthma    no inhaler use x 15 yrs  . Complication of anesthesia    asthma attack in PACU  . Fibroids 2013  . Hyperlipidemia     Patient Active Problem List   Diagnosis Date Noted  . 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 ABDOMINAL HYSTERECTOMY N/A 06/24/2013   Procedure: HYSTERECTOMY ABDOMINAL;  Surgeon: Betsy Coder, MD;  Location: [place] [facility];  Service: Gynecology;  Laterality: N/A;  . BILATERAL  SALPINGECTOMY Bilateral 06/24/2013   Procedure: BILATERAL SALPINGECTOMY;  Surgeon: Betsy Coder, MD;  Location: [place] [facility];  Service: Gynecology;  Laterality: Bilateral;  . CYSTOSCOPY N/A 06/24/2013   Procedure: CYSTOSCOPY;  Surgeon: Betsy Coder, MD;  Location: [place] [facility];  Service: Gynecology;  Laterality: N/A;  . DILATION AND CURETTAGE OF UTERUS N/A 06/24/2013   Procedure: DILATATION AND CURETTAGE;  Surgeon: Betsy Coder, MD;  Location: [facility] [facility];  Service: Gynecology;  Laterality: N/A;  . GANGLION CYST EXCISION    . TUBAL LIGATION      OB History    Gravida  3   Para      Term      Preterm      AB      Living  3     SAB      TAB      Ectopic      Multiple      Live Births               Home Medications    Prior to Admission medications   Medication Sig Start Date End Date Taking? Authorizing Provider  amoxicillin-clavulanate (AUGMENTIN) 400-57 MG/5ML suspension Take 10.9 mLs (872 mg total) by mouth 2 (two) times daily for 10 days. 12/23/19 01/02/20  Keats Kingry, Marguerita Beards, PA-C  atorvastatin (LIPITOR) 40 MG tablet Take 1 tablet (40 mg total) by mouth daily. 12/22/19   Gildardo Pounds, NP  FLUoxetine (PROZAC) 40 MG capsule Take 1 capsule (40 mg total) by mouth daily. 12/22/19 03/21/20  Gildardo Pounds, NP  ibuprofen (ADVIL) 800 MG tablet Take 1 tablet (800 mg total) by mouth 3 (three) times daily. 12/23/19   Keante Urizar, Marguerita Beards, PA-C  Vitamin D, Ergocalciferol, (DRISDOL) 1.25 MG (50000 UNIT) CAPS capsule Take 1 capsule (50,000 Units total) by mouth every 7 (seven) days. 12/22/19   Gildardo Pounds, NP    Family History Family History  Problem Relation Age of Onset  . Hypertension Maternal Grandfather   . Hypertension Mother     Social History Social History   Tobacco Use  . Smoking status: Current Every Day Smoker    Types: Cigarettes  . Smokeless tobacco: Never Used  Substance Use Topics  . Alcohol use: No  . Drug use: No     Allergies   Patient has no known  allergies.   Review of Systems Review of Systems  Per HPI Physical Exam Triage Vital Signs ED Triage Vitals  Enc Vitals Group     BP 12/23/19 1950 (!) 166/102     Pulse Rate 12/23/19 1950 97     Resp 12/23/19 1950 16     Temp 12/23/19 1950 98.7 F (37.1 C)     Temp Source 12/23/19 1950 Oral     SpO2 12/23/19 1950 98 %     Weight --      Height --      Head Circumference --      Peak Flow --      Pain Score 12/23/19 1949 4     Pain Loc --      Pain Edu? --      Excl. in [place]? --    No data found.  Updated Vital Signs BP (!) 166/102 (BP Location: Left Arm)   Pulse 97   Temp 98.7 F (37.1 C) (Oral)   Resp 16   LMP 05/19/2013   SpO2 98%   Visual Acuity Right Eye Distance:   Left Eye Distance:   Bilateral Distance:    Right Eye Near:   Left Eye Near:    Bilateral Near:     Physical Exam Vitals and nursing note reviewed.  Constitutional:      General: She is not in acute distress.    Appearance: She is well-developed. She is not ill-appearing.  HENT:     Head: Normocephalic and atraumatic.     Comments: Face is not visibly swollen.    Right Ear: Tympanic membrane normal.     Left Ear: Tympanic membrane normal.     Nose: Nose normal.     Mouth/Throat:     Mouth: Mucous membranes are moist.     Pharynx: Oropharynx is clear. Uvula midline. No oropharyngeal exudate, posterior oropharyngeal erythema or uvula swelling.     Tonsils: No tonsillar exudate or tonsillar abscesses. 0 on the right. 0 on the left.      Comments: No trismus.  Patient able to fully open mouth for exam without issue.  No visible tonsillar abscess  Speaking in full sentences. Voice raspy but not muffled. No drooling. No stridor.  Eyes:     Extraocular Movements: Extraocular movements intact.     Conjunctiva/sclera: Conjunctivae normal.     Pupils: Pupils are equal, round, and reactive to light.  Neck:     Comments: There is some submandibular tenderness on the left side.  Mild amount of  swelling however there is no significant induration.  No appreciable mass.  Mild adenopathy on the left side.  Tenderness does stop as you approach the midline and there is no submental tenderness or swelling.  No right-sided involvement. Cardiovascular:     Rate and Rhythm: Normal rate.     Heart sounds: No murmur.  Pulmonary:     Effort: Pulmonary effort is normal. No respiratory distress.     Breath sounds: No stridor.  Musculoskeletal:     Cervical back: Neck supple.  Lymphadenopathy:     Cervical: Cervical adenopathy present.  Skin:    General: Skin is warm and dry.  Neurological:     General: No focal deficit present.     Mental Status: She is alert and oriented to person, place, and time.      UC Treatments / Results  Labs (all labs ordered are listed, but only abnormal results are displayed) Labs Reviewed - No data to display  EKG   Radiology No results found.  Procedures Procedures (including critical care time)  Medications Ordered in UC Medications  ketorolac (TORADOL) injection 60 mg (60 mg Intramuscular Given 12/23/19 2035)  cefTRIAXone (ROCEPHIN) injection 1 g (1 g Intramuscular Given 12/23/19 2034)    Initial Impression / Assessment and Plan / UC Course  I have reviewed the triage vital signs and the nursing notes.  Pertinent labs & imaging results that were available during my care of the patient were reviewed by me and considered in my medical decision making (see chart for details).     #Dental infection #Sore throat Patient is a 44 year old presenting with dental infection and painful throat.  Likely the painful throat is secondary to inflammation from the dental infection.  There is no trismus, patient is controlling airway without issues with secretions.  There is some raspiness to voice however there is no obvious tonsillar abscess or uvular deviation.  She is afebrile and nontachycardic in clinic. Controlling airway and secretions well.    Consideration for deep space infection was given however do not believe she has any rapidly progressing infection at this time requiring further work-up tonight. Case and plan was discussed and formulated with Attending Physician on shift.  We will give her initial dose of Rocephin and a Toradol shot here.  To go home with Augmentin twice daily and close follow-up, with initial dose tonight.   Very strict emergency department precautions were discussed with the patient that if such that she had worsening ability to tolerate p.o. liquids or medicines that she should report to the emergency department.  Instructed to follow-up with dentist for evaluation, or  in this clinic if not improving over the next 24 to 48 hours.  She verbalized understanding of this.  Final Clinical Impressions(s) / UC Diagnoses   Final diagnoses:  Dental infection  Sore throat     Discharge Instructions     Start the Augmentin tonight as prescribed Continue the ibuprofen.  If you feel that you cannot tolerate liquids, have worsening swelling or inability to swallow, you need to go to the emergency department. Schedule follow-up with your dentist.  If not improved significantly in 48 hours please return or report to the emergency department.       ED Prescriptions    Medication Sig Dispense Auth. Provider   amoxicillin-clavulanate (AUGMENTIN) 400-57 MG/5ML suspension  (Status: Discontinued) Take 10.9  mLs (872 mg total) by mouth 2 (two) times daily for 10 days. 218 mL Zakyla Tonche, Marguerita Beards, PA-C   ibuprofen (ADVIL) 800 MG tablet  (Status: Discontinued) Take 1 tablet (800 mg total) by mouth 3 (three) times daily. 21 tablet Sharonna Vinje, Marguerita Beards, PA-C   amoxicillin-clavulanate (AUGMENTIN) 400-57 MG/5ML suspension Take 10.9 mLs (872 mg total) by mouth 2 (two) times daily for 10 days. 218 mL Caila Cirelli, Marguerita Beards, PA-C   ibuprofen (ADVIL) 800 MG tablet Take 1 tablet (800 mg total) by mouth 3 (three) times daily. 21 tablet Vetra Shinall, Marguerita Beards, PA-C       PDMP not reviewed this encounter.   Purnell Shoemaker, PA-C 12/23/19 2121    Eulogio Requena, Marguerita Beards, PA-C 12/24/19 0901    Baltasar Twilley, Marguerita Beards, PA-C 12/24/19 [phone]

## 2019-12-24 ENCOUNTER — Other Ambulatory Visit: Payer: PRIVATE HEALTH INSURANCE

## 2019-12-24 ENCOUNTER — Telehealth (HOSPITAL_COMMUNITY): Payer: Self-pay | Admitting: Physician Assistant

## 2019-12-24 DIAGNOSIS — K047 Periapical abscess without sinus: Secondary | ICD-10-CM

## 2019-12-24 NOTE — Telephone Encounter (Cosign Needed)
Patient contacted to check on status. Patient reports pain has improved some. She has tolerated oral medications well. She does report continued painful swallowing and endorses this is at the same level as yesterday evening. She reports taking soup by mouth today. Denies difficulty breathing. Denies fevers today. She reports swelling at left jaw line has not increased. Discussed that if she feels symptoms are worsening at all throughout the evening that she should report to urgent care or the emergency department for re-evaluation. She is to follow up in urgent care tomorrow if no improvement.    Patients voice unmuffled and appears consistent with visit from previous day. Speaking in full sentences without hesitation.    Patient verbalizes understanding of return precautions.

## 2019-12-25 ENCOUNTER — Encounter: Payer: Self-pay | Admitting: Nurse Practitioner

## 2019-12-26 ENCOUNTER — Encounter: Payer: Self-pay | Admitting: Nurse Practitioner

## 2019-12-26 ENCOUNTER — Ambulatory Visit: Payer: PRIVATE HEALTH INSURANCE | Attending: Nurse Practitioner | Admitting: Nurse Practitioner

## 2019-12-26 ENCOUNTER — Other Ambulatory Visit: Payer: Self-pay

## 2019-12-26 DIAGNOSIS — Z09 Encounter for follow-up examination after completed treatment for conditions other than malignant neoplasm: Secondary | ICD-10-CM

## 2019-12-26 DIAGNOSIS — K089 Disorder of teeth and supporting structures, unspecified: Secondary | ICD-10-CM | POA: Diagnosis not present

## 2019-12-26 LAB — CBC WITH DIFFERENTIAL/PLATELET
Basophils Absolute: 0.1 10*3/uL (ref 0.0–0.2)
Basos: 1 %
EOS (ABSOLUTE): 0.2 10*3/uL (ref 0.0–0.4)
Eos: 3 %
Hematocrit: 43.3 % (ref 34.0–46.6)
Hemoglobin: 14.3 g/dL (ref 11.1–15.9)
Immature Grans (Abs): 0.1 10*3/uL (ref 0.0–0.1)
Immature Granulocytes: 1 %
Lymphocytes Absolute: 2.2 10*3/uL (ref 0.7–3.1)
Lymphs: 29 %
MCH: 26.9 pg (ref 26.6–33.0)
MCHC: 33 g/dL (ref 31.5–35.7)
MCV: 81 fL (ref 79–97)
Monocytes Absolute: 0.5 10*3/uL (ref 0.1–0.9)
Monocytes: 7 %
Neutrophils Absolute: 4.4 10*3/uL (ref 1.4–7.0)
Neutrophils: 59 %
Platelets: 302 10*3/uL (ref 150–450)
RBC: 5.32 x10E6/uL — ABNORMAL HIGH (ref 3.77–5.28)
RDW: 13.8 % (ref 11.7–15.4)
WBC: 7.4 10*3/uL (ref 3.4–10.8)

## 2019-12-26 LAB — CMP14+EGFR
ALT: 10 IU/L (ref 0–32)
AST: 12 IU/L (ref 0–40)
Albumin/Globulin Ratio: 1.6 (ref 1.2–2.2)
Albumin: 3.8 g/dL (ref 3.8–4.8)
Alkaline Phosphatase: 74 IU/L (ref 39–117)
BUN/Creatinine Ratio: 12 (ref 9–23)
BUN: 9 mg/dL (ref 6–24)
Bilirubin Total: 0.6 mg/dL (ref 0.0–1.2)
CO2: 23 mmol/L (ref 20–29)
Calcium: 9.1 mg/dL (ref 8.7–10.2)
Chloride: 106 mmol/L (ref 96–106)
Creatinine, Ser: 0.74 mg/dL (ref 0.57–1.00)
GFR calc Af Amer: 115 mL/min/{1.73_m2} (ref >59)
GFR calc non Af Amer: 100 mL/min/{1.73_m2} (ref >59)
Globulin, Total: 2.4 g/dL (ref 1.5–4.5)
Glucose: 87 mg/dL (ref 65–99)
Potassium: 4.1 mmol/L (ref 3.5–5.2)
Sodium: 140 mmol/L (ref 134–144)
Total Protein: 6.2 g/dL (ref 6.0–8.5)

## 2019-12-26 LAB — LIPID PANEL
Chol/HDL Ratio: 4.9 ratio — ABNORMAL HIGH (ref 0.0–4.4)
Cholesterol, Total: 205 mg/dL — ABNORMAL HIGH (ref 100–199)
HDL: 42 mg/dL (ref >39)
LDL Chol Calc (NIH): 127 mg/dL — ABNORMAL HIGH (ref 0–99)
Triglycerides: 200 mg/dL — ABNORMAL HIGH (ref 0–149)
VLDL Cholesterol Cal: 36 mg/dL (ref 5–40)

## 2019-12-26 LAB — HEMOGLOBIN A1C
Est. average glucose Bld gHb Est-mCnc: <74 mg/dL
Hgb A1c MFr Bld: <4.2 % — ABNORMAL LOW (ref 4.8–5.6)

## 2019-12-26 NOTE — Progress Notes (Signed)
Virtual Visit via Telephone Note Due to national recommendations of social distancing due to [place] 19, telehealth visit is felt to be most appropriate for this patient at this time.  I discussed the limitations, risks, security and privacy concerns of performing an evaluation and management service by telephone and the availability of in person appointments. I also discussed with the patient that there may be a patient responsible charge related to this service. The patient expressed understanding and agreed to proceed.    I connected with Anne Reese on 12/26/19  at   9:50 AM EDT  EDT by telephone and verified that I am speaking with the correct person using two identifiers.   Consent I discussed the limitations, risks, security and privacy concerns of performing an evaluation and management service by telephone and the availability of in person appointments. I also discussed with the patient that there may be a patient responsible charge related to this service. The patient expressed understanding and agreed to proceed.   Location of Patient: Private Residence    Location of Provider: [place] participating in Telemedicine visit: Anne Reese    History of Present Illness: Telemedicine visit for: ED F/U   Evaluated in the ED with facial swelling and jaw pain.  Per ED notes reviewed:  She reports this began about 4 days ago when she began having pain location of a tooth that she had it extracted a few years ago.  She reports sharp pain when chewing on the side.  This is gradually progressed to having pain underneath the left side of her jaw.  She noticed the swelling in her jaw today.  She reports this morning it hurt to open her mouth and it hurt to swallow pills.  She reports she was able to swallow pills and drink liquids and it just hurt. She was discharged after being given IV rocephin and Toradol.  Instructed to follow up with the dentist as soon as possible.  She currently has not seen a dentist. Still with throat pain. Works at [employer]. Went to work last night. She has been taking tylenol. She is schedule to return to work but does not feel she will be able to return.  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 She is scheudled to return to work at 3 pm. She needs a work note for today.  She is able to clear her own oral secretions.      Past Medical History:  Diagnosis Date  . Asthma    no inhaler use x 15 yrs  . Complication of anesthesia    asthma attack in PACU  . Fibroids 2013  . Hyperlipidemia     Past Surgical History:  Procedure Laterality Date  . ABDOMINAL HYSTERECTOMY N/A 06/24/2013   Procedure: HYSTERECTOMY ABDOMINAL;  Surgeon: Betsy Coder, MD;  Location: [facility];  Service: Gynecology;  Laterality: N/A;  . BILATERAL SALPINGECTOMY Bilateral 06/24/2013   Procedure: BILATERAL SALPINGECTOMY;  Surgeon: Betsy Coder, MD;  Location: [facility];  Service: Gynecology;  Laterality: Bilateral;  . CYSTOSCOPY N/A 06/24/2013   Procedure: CYSTOSCOPY;  Surgeon: Betsy Coder, MD;  Location: [facility];  Service: Gynecology;  Laterality: N/A;  . DILATION AND CURETTAGE OF UTERUS N/A 06/24/2013   Procedure: DILATATION AND CURETTAGE;  Surgeon: Betsy Coder, MD;  Location: [facility];  Service:  Gynecology;  Laterality: N/A;  . GANGLION CYST EXCISION    . TUBAL LIGATION      Family History  Problem Relation Age of Onset  . Hypertension Maternal Grandfather   . Hypertension Mother     Social History   Socioeconomic History  . Marital status: Single    Spouse name: Not on file  . Number of children: Not on file  . Years of education: Not on file  . Highest education level: Not on file  Occupational History  . Not on file  Tobacco Use  . Smoking status: Current Every Day Smoker    Types: Cigarettes  .  Smokeless tobacco: Never Used  Substance and Sexual Activity  . Alcohol use: No  . Drug use: No  . Sexual activity: Yes    Partners: Female    Birth control/protection: Surgical    Comment: monogamous, 4 years, no toys  Other Topics Concern  . Not on file  Social History Narrative  . Not on file   Social Determinants of Health   Financial Resource Strain:   . Difficulty of Paying Living Expenses:   Food Insecurity:   . Worried About Charity fundraiser in the Last Year:   . Arboriculturist in the Last Year:   Transportation Needs:   . Film/video editor (Medical):   Marland Kitchen Lack of Transportation (Non-Medical):   Physical Activity:   . Days of Exercise per Week:   . Minutes of Exercise per Session:   Stress:   . Feeling of Stress :   Social Connections:   . Frequency of Communication with Friends and Family:   . Frequency of Social Gatherings with Friends and Family:   . Attends Religious Services:   . Active Member of Clubs or Organizations:   . Attends Archivist Meetings:   Marland Kitchen Marital Status:      Observations/Objective: Awake, alert and oriented x 3   Review of Systems  Constitutional: Negative for fever, malaise/fatigue and weight loss.  HENT: Positive for sore throat. Negative for nosebleeds.   Eyes: Negative.  Negative for blurred vision, double vision and photophobia.  Respiratory: Negative.  Negative for cough and shortness of breath.   Cardiovascular: Negative.  Negative for chest pain, palpitations and leg swelling.  Gastrointestinal: Negative.  Negative for heartburn, nausea and vomiting.  Musculoskeletal: Negative.  Negative for myalgias.  Neurological: Negative.  Negative for dizziness, focal weakness, seizures and headaches.  Psychiatric/Behavioral: Negative.  Negative for suicidal ideas.    Assessment and Plan: Anne Reese was seen today for oral pain.  Diagnoses and all orders for this visit:  Hospital discharge follow-up  Poor  dentition Instructed to follow up with her dentist as soon as possible.    Follow Up Instructions Return if symptoms worsen or fail to improve.     I discussed the assessment and treatment plan with the patient. The patient was provided an opportunity to ask questions and all were answered. The patient agreed with the plan and demonstrated an understanding of the instructions.   The patient was advised to call back or seek an in-person evaluation if the symptoms worsen or if the condition fails to improve as anticipated.  I provided 16 minutes of non-face-to-face time during this encounter including median intraservice time, reviewing previous notes, labs, imaging, medications and explaining diagnosis and management.  Gildardo Pounds, FNP-BC

## 2019-12-26 NOTE — Patient Instructions (Addendum)
Tonsillitis ° °Tonsillitis is an infection of the throat. This infection causes the tonsils to become red, tender, and swollen. Tonsils are tissues in the back of your throat. If bacteria caused your infection, antibiotic medicine will be given to you. Sometimes, symptoms of this infection can be treated with the use of medicines that lessen swelling (steroids). If your tonsillitis is very bad (severe) and happens often, you may need to get your tonsils removed (tonsillectomy). °Follow these instructions at home: °Medicines °· Take over-the-counter and prescription medicines only as told by your doctor. °· If you were prescribed an antibiotic, take it as told by your doctor. Do not stop taking the antibiotic even if you start to feel better. °Eating and drinking °· Drink enough fluid to keep your pee (urine) clear or pale yellow. °· While your throat is sore, eat soft or liquid foods like: °? Soup. °? Sherbert. °? Instant breakfast drinks. °· Drink warm fluids. °· Eat frozen ice pops. °General instructions °· Rest as much as possible and get plenty of sleep. °· Gargle with a salt-water mixture 3-4 times a day or as needed. To make a salt-water mixture, completely dissolve ½-1 tsp of salt in 1 cup of warm water. °· Wash your hands often with soap and water. If there is no soap and water, use hand sanitizer. °· Do not share cups, bottles, or other utensils until your symptoms are gone. °· Do not smoke. If you need help quitting, ask your doctor. °· Keep all follow-up visits as told by your doctor. This is important. °Contact a doctor if: °· You have large, tender lumps in your neck. °· You have a fever that does not go away after 2-3 days. °· You have a rash. °· You cough up green, yellow-brown, or bloody fluid. °· You cannot swallow liquids or food for 24 hours. °· Only one of your tonsils is swollen. °Get help right away if: °· You have any new symptoms such as: °? Vomiting. °? Very bad headache. °? Stiff  neck. °? Chest pain. °? Trouble breathing or swallowing. °· You have very bad throat pain and you also have drooling or voice changes. °· You have very bad pain that is not helped by medicine. °· You cannot fully open your mouth. °· You have redness, swelling, or severe pain anywhere in your neck. °Summary °· Tonsillitis causes your tonsils to be red, tender, and swollen. °· While your throat is sore, eat soft or liquid foods. °· Gargle with a salt-water mixture 3-4 times a day or as needed. °· Do not share cups, bottles, or other utensils until your symptoms are gone. °This information is not intended to replace advice given to you by your health care provider. Make sure you discuss any questions you have with your health care provider. °Document Revised: 07/13/2017 Document Reviewed: 09/05/2016 °Elsevier Patient Education © 2020 Elsevier Inc. ° °

## 2020-01-10 ENCOUNTER — Encounter: Payer: Self-pay | Admitting: Nurse Practitioner

## 2020-01-18 ENCOUNTER — Encounter (HOSPITAL_COMMUNITY)
Admission: EM | Disposition: A | Discharge status: Home / Self Care | Payer: Self-pay | Source: Home / Self Care | Attending: Obstetrics and Gynecology

## 2020-01-18 ENCOUNTER — Encounter (HOSPITAL_COMMUNITY): Payer: Self-pay

## 2020-01-18 ENCOUNTER — Emergency Department (HOSPITAL_COMMUNITY): Payer: 59

## 2020-01-18 ENCOUNTER — Emergency Department (HOSPITAL_COMMUNITY): Payer: 59 | Admitting: Anesthesiology

## 2020-01-18 ENCOUNTER — Other Ambulatory Visit: Payer: Self-pay

## 2020-01-18 ENCOUNTER — Ambulatory Visit (INDEPENDENT_AMBULATORY_CARE_PROVIDER_SITE_OTHER)
Admission: EM | Admit: 2020-01-18 | Discharge: 2020-01-18 | Disposition: A | Discharge status: Home / Self Care | Payer: 59 | Source: Home / Self Care | Attending: Family Medicine | Admitting: Family Medicine

## 2020-01-18 ENCOUNTER — Encounter (HOSPITAL_COMMUNITY): Payer: Self-pay | Admitting: Emergency Medicine

## 2020-01-18 ENCOUNTER — Observation Stay (HOSPITAL_COMMUNITY)
Admission: EM | Admit: 2020-01-18 | Discharge: 2020-01-19 | Disposition: A | Discharge status: Home / Self Care | Payer: 59 | Attending: Obstetrics and Gynecology | Admitting: Obstetrics and Gynecology

## 2020-01-18 DIAGNOSIS — N83202 Unspecified ovarian cyst, left side: Secondary | ICD-10-CM | POA: Diagnosis present

## 2020-01-18 DIAGNOSIS — J45909 Unspecified asthma, uncomplicated: Secondary | ICD-10-CM | POA: Diagnosis not present

## 2020-01-18 DIAGNOSIS — Z9889 Other specified postprocedural states: Secondary | ICD-10-CM

## 2020-01-18 DIAGNOSIS — K661 Hemoperitoneum: Secondary | ICD-10-CM | POA: Diagnosis not present

## 2020-01-18 DIAGNOSIS — F17210 Nicotine dependence, cigarettes, uncomplicated: Secondary | ICD-10-CM | POA: Insufficient documentation

## 2020-01-18 DIAGNOSIS — K5730 Diverticulosis of large intestine without perforation or abscess without bleeding: Secondary | ICD-10-CM | POA: Insufficient documentation

## 2020-01-18 DIAGNOSIS — R1032 Left lower quadrant pain: Secondary | ICD-10-CM

## 2020-01-18 DIAGNOSIS — Z79899 Other long term (current) drug therapy: Secondary | ICD-10-CM | POA: Insufficient documentation

## 2020-01-18 DIAGNOSIS — E785 Hyperlipidemia, unspecified: Secondary | ICD-10-CM | POA: Diagnosis not present

## 2020-01-18 DIAGNOSIS — Z6841 Body Mass Index (BMI) 40.0 and over, adult: Secondary | ICD-10-CM | POA: Diagnosis not present

## 2020-01-18 DIAGNOSIS — Z20822 Contact with and (suspected) exposure to covid-19: Secondary | ICD-10-CM | POA: Insufficient documentation

## 2020-01-18 DIAGNOSIS — R102 Pelvic and perineal pain: Secondary | ICD-10-CM | POA: Diagnosis present

## 2020-01-18 DIAGNOSIS — R0902 Hypoxemia: Secondary | ICD-10-CM

## 2020-01-18 HISTORY — PX: LAPAROSCOPY: SHX197

## 2020-01-18 HISTORY — DX: Diverticulitis of intestine, part unspecified, without perforation or abscess without bleeding: K57.92

## 2020-01-18 LAB — COMPREHENSIVE METABOLIC PANEL
ALT: 16 U/L (ref 0–44)
AST: 16 U/L (ref 15–41)
Albumin: 3.8 g/dL (ref 3.5–5.0)
Alkaline Phosphatase: 65 U/L (ref 38–126)
Anion gap: 11 (ref 5–15)
BUN: 10 mg/dL (ref 6–20)
CO2: 21 mmol/L — ABNORMAL LOW (ref 22–32)
Calcium: 9.1 mg/dL (ref 8.9–10.3)
Chloride: 106 mmol/L (ref 98–111)
Creatinine, Ser: 0.83 mg/dL (ref 0.44–1.00)
GFR calc Af Amer: >60 mL/min (ref >60)
GFR calc non Af Amer: >60 mL/min (ref >60)
Glucose, Bld: 96 mg/dL (ref 70–99)
Potassium: 4.1 mmol/L (ref 3.5–5.1)
Sodium: 138 mmol/L (ref 135–145)
Total Bilirubin: 0.8 mg/dL (ref 0.3–1.2)
Total Protein: 7 g/dL (ref 6.5–8.1)

## 2020-01-18 LAB — POCT URINALYSIS DIP (DEVICE)
Bilirubin Urine: NEGATIVE
Glucose, UA: NEGATIVE mg/dL
Hgb urine dipstick: NEGATIVE
Ketones, ur: NEGATIVE mg/dL
Leukocytes,Ua: NEGATIVE
Nitrite: POSITIVE — AB
Protein, ur: NEGATIVE mg/dL
Specific Gravity, Urine: >=1.03 (ref 1.005–1.030)
Urobilinogen, UA: 0.2 mg/dL (ref 0.0–1.0)
pH: 6.5 (ref 5.0–8.0)

## 2020-01-18 LAB — URINALYSIS, ROUTINE W REFLEX MICROSCOPIC
Bilirubin Urine: NEGATIVE
Glucose, UA: NEGATIVE mg/dL
Hgb urine dipstick: NEGATIVE
Ketones, ur: 20 mg/dL — AB
Leukocytes,Ua: NEGATIVE
Nitrite: NEGATIVE
Protein, ur: NEGATIVE mg/dL
Specific Gravity, Urine: 1.038 — ABNORMAL HIGH (ref 1.005–1.030)
pH: 6 (ref 5.0–8.0)

## 2020-01-18 LAB — CBC
HCT: 44.1 % (ref 36.0–46.0)
Hemoglobin: 14.8 g/dL (ref 12.0–15.0)
MCH: 26.9 pg (ref 26.0–34.0)
MCHC: 33.6 g/dL (ref 30.0–36.0)
MCV: 80.2 fL (ref 80.0–100.0)
Platelets: 317 10*3/uL (ref 150–400)
RBC: 5.5 MIL/uL — ABNORMAL HIGH (ref 3.87–5.11)
RDW: 14.4 % (ref 11.5–15.5)
WBC: 9 10*3/uL (ref 4.0–10.5)
nRBC: 0 % (ref 0.0–0.2)

## 2020-01-18 LAB — I-STAT BETA HCG BLOOD, ED (MC, WL, AP ONLY): I-stat hCG, quantitative: <5 m[IU]/mL (ref <5)

## 2020-01-18 LAB — LIPASE, BLOOD: Lipase: 19 U/L (ref 11–51)

## 2020-01-18 LAB — SARS CORONAVIRUS 2 BY RT PCR (HOSPITAL ORDER, PERFORMED IN ~~LOC~~ HOSPITAL LAB): SARS Coronavirus 2: NEGATIVE

## 2020-01-18 SURGERY — LAPAROSCOPY, DIAGNOSTIC
Anesthesia: General

## 2020-01-18 MED ORDER — MEPERIDINE HCL 25 MG/ML IJ SOLN: 6.2500 mg | INTRAMUSCULAR | Status: DC | PRN

## 2020-01-18 MED ORDER — OXYCODONE HCL 5 MG PO TABS: 5.0000 mg | ORAL_TABLET | ORAL | 0 refills | Status: DC | PRN

## 2020-01-18 MED ORDER — BUPIVACAINE HCL (PF) 0.25 % IJ SOLN
INTRAMUSCULAR | Status: DC | PRN
  Administered 2020-01-18: 10 mL

## 2020-01-18 MED ORDER — GLYCOPYRROLATE 0.2 MG/ML IJ SOLN
INTRAMUSCULAR | Status: DC | PRN
  Administered 2020-01-18: .2 mg via INTRAVENOUS

## 2020-01-18 MED ORDER — PHENYLEPHRINE HCL-NACL 10-0.9 MG/250ML-% IV SOLN
INTRAVENOUS | Status: DC | PRN
  Administered 2020-01-18: 50 ug/min via INTRAVENOUS

## 2020-01-18 MED ORDER — DEXAMETHASONE SODIUM PHOSPHATE 10 MG/ML IJ SOLN
INTRAMUSCULAR | Status: DC | PRN
  Administered 2020-01-18: 10 mg via INTRAVENOUS

## 2020-01-18 MED ORDER — SODIUM CHLORIDE 0.9% FLUSH
3.0000 mL | Freq: Once | INTRAVENOUS | Status: AC
  Administered 2020-01-18: 3 mL via INTRAVENOUS

## 2020-01-18 MED ORDER — BUPIVACAINE HCL (PF) 0.25 % IJ SOLN
INTRAMUSCULAR | Status: AC
  Filled 2020-01-18: qty 30

## 2020-01-18 MED ORDER — SUGAMMADEX SODIUM 200 MG/2ML IV SOLN
INTRAVENOUS | Status: DC | PRN
  Administered 2020-01-18: 200 mg via INTRAVENOUS

## 2020-01-18 MED ORDER — MIDAZOLAM HCL 2 MG/2ML IJ SOLN
INTRAMUSCULAR | Status: AC
  Filled 2020-01-18: qty 2

## 2020-01-18 MED ORDER — ONDANSETRON HCL 4 MG/2ML IJ SOLN
4.0000 mg | Freq: Once | INTRAMUSCULAR | Status: AC
  Administered 2020-01-18: 4 mg via INTRAVENOUS
  Filled 2020-01-18: qty 2

## 2020-01-18 MED ORDER — IOHEXOL 300 MG/ML  SOLN
100.0000 mL | Freq: Once | INTRAMUSCULAR | Status: AC | PRN
  Administered 2020-01-18: 100 mL via INTRAVENOUS

## 2020-01-18 MED ORDER — ONDANSETRON HCL 4 MG/2ML IJ SOLN
INTRAMUSCULAR | Status: DC | PRN
  Administered 2020-01-18: 4 mg via INTRAVENOUS

## 2020-01-18 MED ORDER — PROPOFOL 10 MG/ML IV BOLUS
INTRAVENOUS | Status: AC
  Filled 2020-01-18: qty 20

## 2020-01-18 MED ORDER — SODIUM CHLORIDE 0.9 % IV BOLUS
1000.0000 mL | Freq: Once | INTRAVENOUS | Status: AC
  Administered 2020-01-18: 1000 mL via INTRAVENOUS

## 2020-01-18 MED ORDER — ROCURONIUM BROMIDE 10 MG/ML (PF) SYRINGE
PREFILLED_SYRINGE | INTRAVENOUS | Status: DC | PRN
  Administered 2020-01-18: 45 mg via INTRAVENOUS

## 2020-01-18 MED ORDER — HYDROCODONE-ACETAMINOPHEN 5-325 MG PO TABS
1.0000 | ORAL_TABLET | Freq: Once | ORAL | Status: AC
  Administered 2020-01-18: 1 via ORAL
  Filled 2020-01-18: qty 1

## 2020-01-18 MED ORDER — LACTATED RINGERS IV SOLN: INTRAVENOUS | Status: DC | PRN

## 2020-01-18 MED ORDER — SUCCINYLCHOLINE CHLORIDE 20 MG/ML IJ SOLN
INTRAMUSCULAR | Status: DC | PRN
  Administered 2020-01-18: 120 mg via INTRAVENOUS

## 2020-01-18 MED ORDER — METOPROLOL TARTRATE 5 MG/5ML IV SOLN
INTRAVENOUS | Status: DC | PRN
  Administered 2020-01-18: 3 mg via INTRAVENOUS

## 2020-01-18 MED ORDER — MORPHINE SULFATE (PF) 4 MG/ML IV SOLN
4.0000 mg | Freq: Once | INTRAVENOUS | Status: AC
  Administered 2020-01-18: 4 mg via INTRAVENOUS
  Filled 2020-01-18: qty 1

## 2020-01-18 MED ORDER — PROMETHAZINE HCL 25 MG/ML IJ SOLN: 6.2500 mg | INTRAMUSCULAR | Status: DC | PRN

## 2020-01-18 MED ORDER — LIDOCAINE 2% (20 MG/ML) 5 ML SYRINGE
INTRAMUSCULAR | Status: DC | PRN
  Administered 2020-01-18 (×2): 100 mg via INTRAVENOUS

## 2020-01-18 MED ORDER — ONDANSETRON HCL 4 MG/2ML IJ SOLN: 4.0000 mg | Freq: Once | INTRAMUSCULAR | Status: DC

## 2020-01-18 MED ORDER — SODIUM CHLORIDE 0.9 % IR SOLN
Status: DC | PRN
  Administered 2020-01-18: 3000 mL

## 2020-01-18 MED ORDER — FENTANYL CITRATE (PF) 250 MCG/5ML IJ SOLN
INTRAMUSCULAR | Status: AC
  Filled 2020-01-18: qty 5

## 2020-01-18 MED ORDER — FENTANYL CITRATE (PF) 100 MCG/2ML IJ SOLN
INTRAMUSCULAR | Status: DC | PRN
  Administered 2020-01-18 (×2): 50 ug via INTRAVENOUS
  Administered 2020-01-18: 100 ug via INTRAVENOUS
  Administered 2020-01-18: 50 ug via INTRAVENOUS

## 2020-01-18 MED ORDER — HYDROMORPHONE HCL 1 MG/ML IJ SOLN
0.2500 mg | INTRAMUSCULAR | Status: DC | PRN
  Administered 2020-01-19: 0.5 mg via INTRAVENOUS

## 2020-01-18 MED ORDER — PROPOFOL 10 MG/ML IV BOLUS
INTRAVENOUS | Status: DC | PRN
  Administered 2020-01-18: 200 mg via INTRAVENOUS

## 2020-01-18 MED ORDER — MIDAZOLAM HCL 5 MG/5ML IJ SOLN
INTRAMUSCULAR | Status: DC | PRN
  Administered 2020-01-18: 2 mg via INTRAVENOUS

## 2020-01-18 SURGICAL SUPPLY — 35 items
APL SRG 38 LTWT LNG FL B (MISCELLANEOUS) ×1
APPLICATOR ARISTA FLEXITIP XL (MISCELLANEOUS) ×1 IMPLANT
BAG SPEC RTRVL LRG 6X4 10 (ENDOMECHANICALS)
COVER WAND RF STERILE (DRAPES) ×2 IMPLANT
DRSG COVADERM PLUS 2X2 (GAUZE/BANDAGES/DRESSINGS) IMPLANT
DRSG OPSITE POSTOP 3X4 (GAUZE/BANDAGES/DRESSINGS) ×1 IMPLANT
DURAPREP 26ML APPLICATOR (WOUND CARE) ×2 IMPLANT
GLOVE BIOGEL PI IND STRL 6.5 (GLOVE) ×2 IMPLANT
GLOVE BIOGEL PI IND STRL 7.0 (GLOVE) ×2 IMPLANT
GLOVE BIOGEL PI INDICATOR 6.5 (GLOVE) ×2
GLOVE BIOGEL PI INDICATOR 7.0 (GLOVE) ×2
GLOVE ORTHOPEDIC STR SZ6.5 (GLOVE) ×2 IMPLANT
GOWN STRL REUS W/ TWL LRG LVL3 (GOWN DISPOSABLE) ×3 IMPLANT
GOWN STRL REUS W/TWL LRG LVL3 (GOWN DISPOSABLE) ×6
HEMOSTAT ARISTA ABSORB 3G PWDR (HEMOSTASIS) ×1 IMPLANT
KIT TURNOVER KIT B (KITS) ×2 IMPLANT
LIGASURE VESSEL 5MM BLUNT TIP (ELECTROSURGICAL) IMPLANT
NDL INSUFFLATION 14GA 120MM (NEEDLE) ×1 IMPLANT
NEEDLE INSUFFLATION 14GA 120MM (NEEDLE) ×2 IMPLANT
NS IRRIG 1000ML POUR BTL (IV SOLUTION) ×2 IMPLANT
PACK LAPAROSCOPY BASIN (CUSTOM PROCEDURE TRAY) ×2 IMPLANT
PACK TRENDGUARD 450 HYBRID PRO (MISCELLANEOUS) IMPLANT
POUCH SPECIMEN RETRIEVAL 10MM (ENDOMECHANICALS) IMPLANT
PROTECTOR NERVE ULNAR (MISCELLANEOUS) ×4 IMPLANT
SET IRRIG TUBING LAPAROSCOPIC (IRRIGATION / IRRIGATOR) ×1 IMPLANT
SET TUBE SMOKE EVAC HIGH FLOW (TUBING) ×2 IMPLANT
SLEEVE XCEL OPT CAN 5 100 (ENDOMECHANICALS) ×2 IMPLANT
SUT MNCRL AB 4-0 PS2 18 (SUTURE) ×2 IMPLANT
SUT VICRYL 0 UR6 27IN ABS (SUTURE) ×2 IMPLANT
TOWEL GREEN STERILE FF (TOWEL DISPOSABLE) ×4 IMPLANT
TRAY FOLEY W/BAG SLVR 14FR (SET/KITS/TRAYS/PACK) ×2 IMPLANT
TRENDGUARD 450 HYBRID PRO PACK (MISCELLANEOUS) ×2
TROCAR XCEL NON-BLD 11X100MML (ENDOMECHANICALS) ×2 IMPLANT
TROCAR XCEL NON-BLD 5MMX100MML (ENDOMECHANICALS) ×2 IMPLANT
WARMER LAPAROSCOPE (MISCELLANEOUS) ×2 IMPLANT

## 2020-01-18 NOTE — ED Provider Notes (Signed)
Physical Exam  BP (!) 154/79   Pulse 87   Temp 98.5 F (36.9 C)   Resp 18   Ht 5\' 3"  (1.6 m)   Wt 107 kg   LMP 05/19/2013 Comment: Negative HCG in ED today  SpO2 96%   BMI 41.81 kg/m   Physical Exam Vitals and nursing note reviewed. Exam conducted with a chaperone present.  Constitutional:      Appearance: She is well-developed. She is not ill-appearing.  HENT:     Head: Normocephalic and atraumatic.  Cardiovascular:     Rate and Rhythm: Normal rate.  Pulmonary:     Effort: Pulmonary effort is normal.     Breath sounds: No wheezing, rhonchi or rales.  Abdominal:     General: Abdomen is flat. Bowel sounds are normal.     Palpations: Abdomen is soft.     Tenderness: There is abdominal tenderness in the suprapubic area and left lower quadrant. There is no right CVA tenderness or left CVA tenderness. Negative signs include McBurney's sign.     Hernia: No hernia is present.  Genitourinary:    Exam position: Supine.     Labia:        Right: No tenderness.        Left: No tenderness.      Vagina: Normal. No vaginal discharge or tenderness.     Cervix: No cervical motion tenderness.     Uterus: Absent.      Adnexa: Right adnexa normal.       Left: Tenderness present.      Comments: Chaperoned by RN Zelphia Cairo.  No vaginal discharge, left adnexa tenderness.  Skin:    General: Skin is warm and dry.  Neurological:     Mental Status: She is alert and oriented to person, place, and time.     ED Course/Procedures     .Critical Care Performed by: Janeece Fitting, PA-C Authorized by: Janeece Fitting, PA-C   Critical care provider statement:    Critical care time (minutes):  45   Critical care start time:  01/18/2020 5:00 PM   Critical care end time:  01/18/2020 5:45 PM   Critical care time was exclusive of:  Separately billable procedures and treating other patients   Critical care was necessary to treat or prevent imminent or life-threatening deterioration of the following  conditions:  Circulatory failure   Critical care was time spent personally by me on the following activities:  Blood draw for specimens, development of treatment plan with patient or surrogate, discussions with consultants, evaluation of patient's response to treatment, examination of patient, obtaining history from patient or surrogate, ordering and performing treatments and interventions, ordering and review of laboratory studies, ordering and review of radiographic studies, pulse oximetry, re-evaluation of patient's condition and review of old charts    MDM  Patient care assumed from Margaux V. PA at shift change, please see her note for full HPI. Briefly, patient here with sudden onset of left lower quadrant pain which woke her up from her sleep last night.  Also endorses some nausea, this is a prior history of diverticulitis but reports symptoms are similar today.  She does have a history of partial hysterectomy and bilateral salpingectomy.  Originally presented to urgent care however was sent here for further evaluation of the perforation.  Patient is currently sexually active with female partner, denies any discharge, or urinary symptoms.   Called received from radiologist, very little flow to patients left ovary, torsion is not entirely  LEFT ovary is prominent size and heterogeneous containing scattered  internal isoechoic to hypoechoic echogenicity and fluid, in  combination with CT findings potentially representing a LEFT ovarian  hemorrhagic cyst.    Limited blood flow is seen in the periphery of the LEFT ovary  however and coexistent ovarian torsion is not entirely excluded.    Post hysterectomy with nonvisualization of RIGHT ovary.    Results were discussed with patient at length, she reports her pain is now 5 out of 10 after receiving a second round of pain medication.  She continues to endorse nausea, given additional Zofran.  Will not consult GYN for further  recommendations.  5:56 PM Spoke to Dr. Rosana Hoes from GYN, who will evaluate patient while in the ED.  7:14 PM Patient will be taken to OR by GYN, for exploratory laparotomy, covid 19 testing ordered. She does have record of [employer] 12/04/2019.      Portions of this note were generated with Lobbyist. Dictation errors may occur despite best attempts at proofreading.    Janeece Fitting, PA-C 01/18/20 1916    Drenda Freeze, MD 01/18/20 2221

## 2020-01-18 NOTE — Transfer of Care (Signed)
Immediate Anesthesia Transfer of Care Note  Patient: Anne Reese  Procedure(s) Performed: LAPAROSCOPY DIAGNOSTIC, WITH EVACTION OF HEMOPERITONEUM (N/A )  Patient Location: PACU  Anesthesia Type:General  Level of Consciousness: awake and patient cooperative  Airway & Oxygen Therapy: Patient Spontanous Breathing and Patient connected to face mask oxygen  Post-op Assessment: Report given to RN, Post -op Vital signs reviewed and stable and Patient moving all extremities X 4  Post vital signs: Reviewed and stable  Last Vitals:  Vitals Value Taken Time  BP    Temp    Pulse    Resp    SpO2      Last Pain:  Vitals:   01/18/20 2116  TempSrc: Oral  PainSc:          Complications: No apparent anesthesia complications.  Patient denies complaints.  Patient snoring when sleeping.  02 sats 90 on face mask oxygen.  BBS clear, able to take deep breaths.

## 2020-01-18 NOTE — ED Notes (Addendum)
Patient transported to [facility] 

## 2020-01-18 NOTE — ED Provider Notes (Signed)
[facility]    CSN: [id number] Arrival date & time: 01/18/20  1020      History   Chief Complaint Chief Complaint  Patient presents with  . Flank Pain    HPI Anne Reese is a 44 y.o. female.   HPI   History of diverticular disease and recurrent diverticulitis Last time was hospitalized for 7 days Now has one day of severe abdominal pain sweats and nausea Is pacing the floor and bent at the waist Describes pain as severe   Past Medical History:  Diagnosis Date  . Asthma    no inhaler use x 15 yrs  . Complication of anesthesia    asthma attack in PACU  . Fibroids 2013  . Hyperlipidemia     Patient Active Problem List   Diagnosis Date Noted  . 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 ABDOMINAL HYSTERECTOMY N/A 06/24/2013   Procedure: HYSTERECTOMY ABDOMINAL;  Surgeon: Betsy Coder, MD;  Location: [facility];  Service: Gynecology;  Laterality: N/A;  . BILATERAL SALPINGECTOMY Bilateral 06/24/2013   Procedure: BILATERAL SALPINGECTOMY;  Surgeon: Betsy Coder, MD;  Location: [facility];  Service: Gynecology;  Laterality: Bilateral;  . CYSTOSCOPY N/A 06/24/2013   Procedure: CYSTOSCOPY;  Surgeon: Betsy Coder, MD;  Location: [facility];  Service: Gynecology;  Laterality: N/A;  . DILATION AND CURETTAGE OF UTERUS N/A 06/24/2013   Procedure: DILATATION AND CURETTAGE;  Surgeon: Betsy Coder, MD;  Location: [facility];  Service: Gynecology;  Laterality: N/A;  . GANGLION CYST EXCISION    . TUBAL LIGATION      OB History    Gravida  3   Para      Term      Preterm      AB      Living  3     SAB      TAB      Ectopic      Multiple      Live Births               Home Medications    Prior to Admission medications   Medication Sig Start Date End Date Taking? Authorizing Provider  atorvastatin (LIPITOR) 40 MG tablet Take 1 tablet (40 mg total) by mouth daily.  12/22/19   Gildardo Pounds, NP  FLUoxetine (PROZAC) 40 MG capsule Take 1 capsule (40 mg total) by mouth daily. 12/22/19 03/21/20  Gildardo Pounds, NP  ibuprofen (ADVIL) 800 MG tablet Take 1 tablet (800 mg total) by mouth 3 (three) times daily. 12/23/19   Darr, Marguerita Beards, PA-C  Vitamin D, Ergocalciferol, (DRISDOL) 1.25 MG (50000 UNIT) CAPS capsule Take 1 capsule (50,000 Units total) by mouth every 7 (seven) days. 12/22/19   Gildardo Pounds, NP    Family History Family History  Problem Relation Age of Onset  . Hypertension Maternal Grandfather   . Hypertension Mother     Social History Social History   Tobacco Use  . Smoking status: Current Every Day Smoker    Packs/day: 1.00    Types: Cigarettes  . Smokeless tobacco: Never Used  Substance Use Topics  . Alcohol use: No  . Drug use: No     Allergies   Patient has no known allergies.   Review of Systems Review of Systems  Constitutional: Positive for chills, fatigue and fever.  Gastrointestinal: Positive for abdominal pain and rectal pain.  Physical Exam Triage Vital Signs ED Triage Vitals  Enc Vitals Group     BP 01/18/20 1137 (!) 178/96     Pulse Rate 01/18/20 1137 80     Resp 01/18/20 1137 16     Temp 01/18/20 1137 98.3 F (36.8 C)     Temp Source 01/18/20 1137 Oral     SpO2 01/18/20 1137 99 %     Weight 01/18/20 1139 236 lb (107 kg)     Height 01/18/20 1139 5\' 3"  (1.6 m)     Head Circumference --      Peak Flow --      Pain Score 01/18/20 1139 8     Pain Loc --      Pain Edu? --      Excl. in [place]? --    No data found.  Updated Vital Signs BP (!) 178/96   Pulse 80   Temp 98.3 F (36.8 C) (Oral)   Resp 16   Ht 5\' 3"  (1.6 m)   Wt 107 kg   LMP 05/19/2013   SpO2 99%   BMI 41.81 kg/m     Physical Exam Constitutional:      General: She is not in acute distress.    Appearance: She is well-developed. She is ill-appearing and toxic-appearing.     Comments: Acutely uncomfortable.  Pacing the floor.   Holding onto the exam table bent at the waist.  Unable to straighten completely.  Unable to get on the exam table for abdominal exam  HENT:     Head: Normocephalic and atraumatic.     Mouth/Throat:     Comments: Mask in place Eyes:     Conjunctiva/sclera: Conjunctivae normal.     Pupils: Pupils are equal, round, and reactive to light.  Cardiovascular:     Rate and Rhythm: Normal rate.  Pulmonary:     Effort: Pulmonary effort is normal. No respiratory distress.  Abdominal:     General: There is no distension.     Palpations: Abdomen is soft.     Tenderness: There is no right CVA tenderness or left CVA tenderness.     Comments: Attempted to palpate abdomen with patient standing.  Any pressure causes her to gasp and pull away.  Musculoskeletal:        General: Normal range of motion.     Cervical back: Normal range of motion.  Skin:    General: Skin is warm and dry.  Neurological:     Mental Status: She is alert.  Psychiatric:        Mood and Affect: Mood normal.      UC Treatments / Results  Labs (all labs ordered are listed, but only abnormal results are displayed) Labs Reviewed  POCT URINALYSIS DIP (DEVICE) - Abnormal; Notable for the following components:      Result Value   Nitrite POSITIVE (*)    All other components within normal limits  URINE CULTURE    EKG   Radiology No results found.  Procedures Procedures (including critical care time)  Medications Ordered in UC Medications - No data to display  Initial Impression / Assessment and Plan / UC Course  I have reviewed the triage vital signs and the nursing notes.  Pertinent labs & imaging results that were available during my care of the patient were reviewed by me and considered in my medical decision making (see chart for details).     Patient clearly has severe abdominal pain. I believe she has  she has diverticulitis with possible rupture.  I am unable to fully evaluate her here at the urgent care  center.  I have sent her to the emergency room for higher level of care Final Clinical Impressions(s) / UC Diagnoses   Final diagnoses:  Acute abdominal pain in left lower quadrant     Discharge Instructions     GO TO ER   ED Prescriptions    None     PDMP not reviewed this encounter.   Raylene Everts, MD 01/18/20 1240

## 2020-01-18 NOTE — ED Provider Notes (Signed)
[facility] EMERGENCY DEPARTMENT Provider Note   CSN: [id number] Arrival date & time: 01/18/20  1234     History Chief Complaint  Patient presents with  . Abdominal Pain    Anne Reese is a 44 y.o. female who presents to the ED today with complaint of sudden onset, constant, sharp, LLQ abdominal pain woke her up out of her sleep last night.  She also complains of nausea, no emesis.  Last normal bowel movement 2 days ago.  She does have a history of diverticulitis and states this feels similar.  Previous abdominal surgeries hysterectomy and bilateral salpingectomy.  She initially presented to urgent care today however was sent over due to concern for possible perforation due to amount of discomfort.  Patient denies fevers, chills, urinary symptoms, pelvic pain, vaginal discharge, any other associated symptoms.   Chart review patient was admitted last year on 05/03/2019 for outpatient failure of acute diverticulitis.  It appears that she was initially discharged home with oral throat and Flagyl however she represented to the ED with severe pain.  Treated with IV antibiotics and discharged after a few days.  No signs of perforation.   The history is provided by the patient and medical records.       Past Medical History:  Diagnosis Date  . Asthma    no inhaler use x 15 yrs  . Complication of anesthesia    asthma attack in PACU  . Diverticulitis   . Fibroids 2013  . Hyperlipidemia     Patient Active Problem List   Diagnosis Date Noted  . 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 ABDOMINAL HYSTERECTOMY N/A 06/24/2013   Procedure: HYSTERECTOMY ABDOMINAL;  Surgeon: Betsy Coder, MD;  Location: [facility];  Service: Gynecology;  Laterality: N/A;  . BILATERAL SALPINGECTOMY Bilateral 06/24/2013   Procedure: BILATERAL SALPINGECTOMY;  Surgeon: Betsy Coder, MD;  Location: [facility];  Service:  Gynecology;  Laterality: Bilateral;  . CYSTOSCOPY N/A 06/24/2013   Procedure: CYSTOSCOPY;  Surgeon: Betsy Coder, MD;  Location: [facility];  Service: Gynecology;  Laterality: N/A;  . DILATION AND CURETTAGE OF UTERUS N/A 06/24/2013   Procedure: DILATATION AND CURETTAGE;  Surgeon: Betsy Coder, MD;  Location: [facility];  Service: Gynecology;  Laterality: N/A;  . GANGLION CYST EXCISION    . TUBAL LIGATION       OB History    Gravida  3   Para      Term      Preterm      AB      Living  3     SAB      TAB      Ectopic      Multiple      Live Births              Family History  Problem Relation Age of Onset  . Hypertension Maternal Grandfather   . Hypertension Mother     Social History   Tobacco Use  . Smoking status: Current Every Day Smoker    Packs/day: 1.00    Types: Cigarettes  . Smokeless tobacco: Never Used  Substance Use Topics  . Alcohol use: No  . Drug use: No    Home Medications Prior to Admission medications   Medication Sig Start Date End Date Taking? Authorizing Provider  atorvastatin (LIPITOR) 40 MG tablet Take 1 tablet (40 mg total) by  mouth daily. 12/22/19   Gildardo Pounds, NP  FLUoxetine (PROZAC) 40 MG capsule Take 1 capsule (40 mg total) by mouth daily. 12/22/19 03/21/20  Gildardo Pounds, NP  ibuprofen (ADVIL) 800 MG tablet Take 1 tablet (800 mg total) by mouth 3 (three) times daily. 12/23/19   Darr, Marguerita Beards, PA-C  Vitamin D, Ergocalciferol, (DRISDOL) 1.25 MG (50000 UNIT) CAPS capsule Take 1 capsule (50,000 Units total) by mouth every 7 (seven) days. 12/22/19   Gildardo Pounds, NP    Allergies    Patient has no known allergies.  Review of Systems   Review of Systems  Constitutional: Negative for chills and fever.  Gastrointestinal: Positive for abdominal pain, constipation, nausea and vomiting. Negative for blood in stool and diarrhea.  Genitourinary: Negative for dysuria, pelvic pain and vaginal pain.  All other systems reviewed  and are negative.   Physical Exam Updated Vital Signs BP (!) 192/95 (BP Location: Right Arm)   Pulse 73   Temp 98.5 F (36.9 C)   Resp 17   Ht 5\' 3"  (1.6 m)   Wt 107 kg   LMP 05/19/2013 Comment: Negative HCG in ED today  SpO2 100%   BMI 41.81 kg/m   Physical Exam Vitals and nursing note reviewed.  Constitutional:      Appearance: She is obese. She is not ill-appearing or diaphoretic.  HENT:     Head: Normocephalic and atraumatic.  Eyes:     Conjunctiva/sclera: Conjunctivae normal.  Cardiovascular:     Rate and Rhythm: Normal rate and regular rhythm.  Pulmonary:     Effort: Pulmonary effort is normal.     Breath sounds: Normal breath sounds. No wheezing, rhonchi or rales.  Abdominal:     Palpations: Abdomen is soft.     Tenderness: There is generalized abdominal tenderness and tenderness in the left lower quadrant. There is no right CVA tenderness, left CVA tenderness, guarding or rebound.  Musculoskeletal:     Cervical back: Neck supple.  Skin:    General: Skin is warm and dry.  Neurological:     Mental Status: She is alert.     ED Results / Procedures / Treatments   Labs (all labs ordered are listed, but only abnormal results are displayed) Labs Reviewed  COMPREHENSIVE METABOLIC PANEL - Abnormal; Notable for the following components:      Result Value   CO2 21 (*)    All other components within normal limits  CBC - Abnormal; Notable for the following components:   RBC 5.50 (*)    All other components within normal limits  LIPASE, BLOOD  URINALYSIS, ROUTINE W REFLEX MICROSCOPIC  I-STAT BETA HCG BLOOD, ED ([facility], [facility], [facility] ONLY)    EKG None  Radiology CT Abdomen Pelvis W Contrast  Result Date: 01/18/2020 CLINICAL DATA:  Left lower quadrant abdominal pain. EXAM: CT ABDOMEN AND PELVIS WITH CONTRAST TECHNIQUE: Multidetector CT imaging of the abdomen and pelvis was performed using the standard protocol following bolus administration of intravenous contrast.  CONTRAST:  174mL OMNIPAQUE IOHEXOL 300 MG/ML  SOLN COMPARISON:  None. FINDINGS: Lower chest: Mild atelectasis is seen within the bilateral lung bases. Hepatobiliary: No focal liver abnormality is seen. No gallstones, gallbladder wall thickening, or biliary dilatation. Pancreas: Unremarkable. No pancreatic ductal dilatation or surrounding inflammatory changes. Spleen: Normal in size without focal abnormality. Adrenals/Urinary Tract: Adrenal glands are unremarkable. Kidneys are normal, without renal calculi, focal lesion, or hydronephrosis. Bladder is unremarkable. Stomach/Bowel: Stomach is within normal limits. Appendix appears  normal. No evidence of bowel dilatation. Noninflamed diverticula are seen within the proximal to mid sigmoid colon. Vascular/Lymphatic: No significant vascular findings are present. No enlarged abdominal or pelvic lymph nodes. Reproductive: Status post hysterectomy. A 2.7 cm cyst is seen along the anterior aspect of the left adnexa (axial CT image 84, CT series number 3). Other: No abdominal wall hernia or abnormality. No abdominopelvic ascites. Musculoskeletal: No acute or significant osseous findings. IMPRESSION: 1. Noninflamed sigmoid diverticulosis. 2. 2.7 cm left adnexal cyst, likely ovarian in origin. Electronically Signed   By: Virgina Norfolk M.D.   On: 01/18/2020 15:34    Procedures Procedures (including critical care time)  Medications Ordered in ED Medications  sodium chloride flush (NS) 0.9 % injection 3 mL (has no administration in time range)  sodium chloride 0.9 % bolus 1,000 mL (1,000 mLs Intravenous New Bag/Given 01/18/20 1435)  ondansetron (ZOFRAN) injection 4 mg (4 mg Intravenous Given 01/18/20 1438)  morphine 4 MG/ML injection 4 mg (4 mg Intravenous Given 01/18/20 1438)  iohexol (OMNIPAQUE) 300 MG/ML solution 100 mL (100 mLs Intravenous Contrast Given 01/18/20 1513)    ED Course  I have reviewed the triage vital signs and the nursing notes.  Pertinent labs &  imaging results that were available during my care of the patient were reviewed by me and considered in my medical decision making (see chart for details).    MDM Rules/Calculators/A&P                      44 year old female who presents to the ED today with complaint of left lower quadrant abdominal pain that woke her up out of her sleep last night.  Was sent over from urgent care for further evaluation with concern for possible perforation given amount of discomfort on exam.  It does appear that patient kept swatting providers hand away.  During my exam I advised patient she will need to lay flat for further evaluation.  She tolerated exam well.  She has diffuse tenderness to abdomen worse in the left lower quadrant without obvious peritoneal signs.  On arrival to the ED patient is afebrile, nontachycardic and nontachypneic.  Her blood pressure is elevated however this might be secondary to pain, patient not on antihypertensives.  Will work-up with labs and imaging at this time.  IV fluids, morphine, Zofran given for symptomatic relief.   CBC without leukocytosis.  Hemoglobin stable. CMP with bicarb 21, no other electrolyte abnormalities.  Patient receiving fluids at this time. Lipase negative Stat beta-hCG negative, currently awaiting on UA.  CT scan without signs of diverituculitis. Does show ovarian cyst on left side - likely contributing to pt's pain. Given this will obtain ultrasound at this time.   On reeval updated pt on plan; she is in agreement. She continues to deny vaginal discharge. She is sexually active with female partner; not concerned about STI. Do not feel pt needs pelvic exam at this time. She states her pain has somewhat returned - will provide additional information.   3:42 PM At shift change case signed out to [facility], [place], who will dispo accordingly. If no acute findings on ultrasound pt to be discharged home with OBGYN follow up.   Final Clinical Impression(s) / ED  Diagnoses Final diagnoses:  LLQ pain  Cyst of left ovary    Rx / DC Orders ED Discharge Orders    None       Eustaquio Maize, PA-C 01/18/20 1543    Lennice Sites,  DO 01/21/20 8835

## 2020-01-18 NOTE — H&P (Signed)
OB/GYN History and Physical  Anne Reese is a 44 y.o. G3P0 presenting for sharp pain in left lower quadrant that awoke her out of the sleep this am. She reports it felt like she was in labor, it was so bad. Has had pain like this before and had diverticulitis and so she came to hospital. While in waiting room, she had 10/10 pain, sharp and felt like "she couldn't get any relief." Had significant pain that only improved with morphine. Now, feels like aching/dull pain but still rates it 5-6/10. Some chills this am, some nausea as well. Denies bowel issues, has diarrhea but this is normal for her.     Past Medical History:  Diagnosis Date  . Asthma    no inhaler use x 15 yrs  . Complication of anesthesia    asthma attack in PACU  . Diverticulitis   . Fibroids 2013  . Hyperlipidemia    Past Surgical History:  Procedure Laterality Date  . ABDOMINAL HYSTERECTOMY N/A 06/24/2013   Procedure: HYSTERECTOMY ABDOMINAL;  Surgeon: Betsy Coder, MD;  Location: [facility];  Service: Gynecology;  Laterality: N/A;  . BILATERAL SALPINGECTOMY Bilateral 06/24/2013   Procedure: BILATERAL SALPINGECTOMY;  Surgeon: Betsy Coder, MD;  Location: [facility];  Service: Gynecology;  Laterality: Bilateral;  . CYSTOSCOPY N/A 06/24/2013   Procedure: CYSTOSCOPY;  Surgeon: Betsy Coder, MD;  Location: [facility];  Service: Gynecology;  Laterality: N/A;  . DILATION AND CURETTAGE OF UTERUS N/A 06/24/2013   Procedure: DILATATION AND CURETTAGE;  Surgeon: Betsy Coder, MD;  Location: [facility];  Service: Gynecology;  Laterality: N/A;  . GANGLION CYST EXCISION    . TUBAL LIGATION      OB History  Gravida Para Term Preterm AB Living  3         3  SAB TAB Ectopic Multiple Live Births               # Outcome Date GA Lbr Len/2nd Weight Sex Delivery Anes PTL Lv  3 Gravida           2 Gravida           1 [place]             Social History   Socioeconomic History  . Marital status: Single    Spouse name: Not on file    . Number of children: Not on file  . Years of education: Not on file  . Highest education level: Not on file  Occupational History  . Not on file  Tobacco Use  . Smoking status: Current Every Day Smoker    Packs/day: 1.00    Types: Cigarettes  . Smokeless tobacco: Never Used  Substance and Sexual Activity  . Alcohol use: No  . Drug use: No  . Sexual activity: Yes    Partners: Female    Birth control/protection: Surgical    Comment: monogamous, 4 years, no toys  Other Topics Concern  . Not on file  Social History Narrative  . Not on file   Social Determinants of Health   Financial Resource Strain:   . Difficulty of Paying Living Expenses:   Food Insecurity:   . Worried About Charity fundraiser in the Last Year:   . Arboriculturist in the Last Year:   Transportation Needs:   . Film/video editor (Medical):   Marland Kitchen Lack of Transportation (Non-Medical):   Physical Activity:   . Days of Exercise per Week:   .  Minutes of Exercise per Session:   Stress:   . Feeling of Stress :   Social Connections:   . Frequency of Communication with Friends and Family:   . Frequency of Social Gatherings with Friends and Family:   . Attends Religious Services:   . Active Member of Clubs or Organizations:   . Attends Archivist Meetings:   Marland Kitchen Marital Status:     Family History  Problem Relation Age of Onset  . Hypertension Maternal Grandfather   . Hypertension Mother     (Not in a hospital admission)   No Known Allergies  Review of Systems: Negative except for what is mentioned in HPI.     Physical Exam: BP (!) 154/79   Pulse 87   Temp 98.5 F (36.9 C)   Resp 18   Ht 5\' 3"  (1.6 m)   Wt 107 kg   LMP 05/19/2013 Comment: Negative HCG in ED today  SpO2 96%   BMI 41.81 kg/m  CONSTITUTIONAL: Well-developed, well-nourished female in no acute distress.  HENT:  Normocephalic, atraumatic, External right and left ear normal. Oropharynx is clear and moist EYES:  Conjunctivae and EOM are normal. Pupils are equal, round, and reactive to light. No scleral icterus.  NECK: Normal range of motion, supple, no masses SKIN: Skin is warm and dry. No rash noted. Not diaphoretic. No erythema. No pallor. [place]: Alert and oriented to person, place, and time. Normal reflexes, muscle tone coordination. No cranial nerve deficit noted. PSYCHIATRIC: Normal mood and affect. Normal behavior. Normal judgment and thought content. CARDIOVASCULAR: Normal heart rate noted, regular rhythm RESPIRATORY: Effort normal, no problems with respiration noted ABDOMEN: Soft, significantly tender in LLQ, mild tenderness in RLQ, non-distended, no guarding Well-healed Pfannenstiel incision. PELVIC: Deferred MUSCULOSKELETAL: Normal range of motion. No edema and no tenderness. 2+ distal pulses.   Pertinent Labs/Studies:   Results for orders placed or performed during the hospital encounter of 01/18/20 (from the past 72 hour(s))  Lipase, blood     Status: None   Collection Time: 01/18/20  1:16 PM  Result Value Ref Range   Lipase 19 11 - 51 U/L    Comment: Performed at [facility], [address], [place], [place] [address]  Comprehensive metabolic panel     Status: Abnormal   Collection Time: 01/18/20  1:16 PM  Result Value Ref Range   Sodium 138 135 - 145 mmol/L   Potassium 4.1 3.5 - 5.1 mmol/L   Chloride 106 98 - 111 mmol/L   CO2 21 (L) 22 - 32 mmol/L   Glucose, Bld 96 70 - 99 mg/dL    Comment: Glucose reference range applies only to samples taken after fasting for at least 8 hours.   BUN 10 6 - 20 mg/dL   Creatinine, Ser 0.83 0.44 - 1.00 mg/dL   Calcium 9.1 8.9 - 10.3 mg/dL   Total Protein 7.0 6.5 - 8.1 g/dL   Albumin 3.8 3.5 - 5.0 g/dL   AST 16 15 - 41 U/L   ALT 16 0 - 44 U/L   Alkaline Phosphatase 65 38 - 126 U/L   Total Bilirubin 0.8 0.3 - 1.2 mg/dL   GFR calc non Af Amer >60 >60 mL/min   GFR calc Af Amer >60 >60 mL/min   Anion gap 11 5 - 15    Comment:  Performed at [facility], [place] [address] [place], [place] [address]  CBC     Status: Abnormal   Collection  Time: 01/18/20  1:16 PM  Result Value Ref Range   WBC 9.0 4.0 - 10.5 K/uL   RBC 5.50 (H) 3.87 - 5.11 MIL/uL   Hemoglobin 14.8 12.0 - 15.0 g/dL   HCT 44.1 36.0 - 46.0 %   MCV 80.2 80.0 - 100.0 fL   MCH 26.9 26.0 - 34.0 pg   MCHC 33.6 30.0 - 36.0 g/dL   RDW 14.4 11.5 - 15.5 %   Platelets 317 150 - 400 K/uL   nRBC 0.0 0.0 - 0.2 %    Comment: Performed at [facility], [place] [address] [place], [place] [address]  I-Stat beta hCG blood, ED     Status: None   Collection Time: 01/18/20  1:36 PM  Result Value Ref Range   I-stat hCG, quantitative <5.0 <5 mIU/mL   Comment 3            Comment:   GEST. AGE      CONC.  (mIU/mL)   <=1 WEEK        5 - 50     2 WEEKS       50 - 500     3 WEEKS       100 - 10,000     4 WEEKS     1,000 - 30,000        FEMALE AND NON-PREGNANT FEMALE:     LESS THAN 5 mIU/mL    TVUS 01/18/20 IMPRESSION: LEFT ovary is prominent size and heterogeneous containing scattered internal isoechoic to hypoechoic echogenicity and fluid, in combination with CT findings potentially representing a LEFT ovarian hemorrhagic cyst.  Limited blood flow is seen in the periphery of the LEFT ovary however and coexistent ovarian torsion is not entirely excluded.  Post hysterectomy with nonvisualization of RIGHT ovary.  Critical Value/emergent results were called by telephone at the time of interpretation on 01/18/2020 at 5:26 pm to provider J. Soto PA, who verbally acknowledged these results.   Electronically Signed   By: Lavonia Dana M.D.   On: 01/18/2020 17:29    Assessment and Plan :Anne Reese is a 44 y.o. G3P0 who presents today with sudden onset, severe left lower quadrant pain rating it 10/10. Now with morphine, still with severe left lower quadrant pain. Moderately tender on abdominal exam. Imaging suspicious for hemorrhagic cyst, cannot rule out  torsion. Given sudden onset, severe pain with limited flow in ovary on [place], suspect ovarian torsion.   I reviewed this with patient, that only way to know for sure is to perform diagnostic laparoscopy. Reviewed risks/benefits including pain possibly due only to hemorrhagic cyst and no torsion, risk of necrosis if there is a torsion that is not treated. Given her pain, I suspect torsion and offered surgery. I reviewed that if there is large cyst, may attempt cystectomy, if there is significant bleeding, this may require removal of ovary. Reviewed menopause would only occur if both ovaries were removed. She verbalizes understanding of the above. The risks of laparoscopic surgery were discussed with the patient including but not limited to: bleeding which may require transfusion or reoperation; infection which may require antibiotics; injury to bowel, bladder, ureters or other surrounding organs; need for additional procedures including laparotomy; thromboembolic phenomenon, incisional problems and other postoperative/anesthesia complications. Patient verbalized understanding of the above and desires to proceed with diagnostic laparoscopy. Consent signed. Answered all questions.  She is agreeable to blood transfusion in the event of emergency.    Plan for diagnostic laparoscopy with  detorsion of ovary NPO Await covid test Admission labs ordered IVF OR staff aware   K. Arvilla Meres, M.D. Attending [place], [facility] for [employer], [place]

## 2020-01-18 NOTE — ED Triage Notes (Signed)
Pt c/o 8/10 left flank pain and nausea started yesterday. Pt denies urinary symptoms.

## 2020-01-18 NOTE — ED Notes (Signed)
Patient is being discharged from the Urgent Care and sent to the Emergency Department via wheelchair . Per dr Meda Coffee, patient is in need of higher level of care due to complaint. Patient is aware and verbalizes understanding of plan of care.  Vitals:   01/18/20 1137  BP: (!) 178/96  Pulse: 80  Resp: 16  Temp: 98.3 F (36.8 C)  SpO2: 99%

## 2020-01-18 NOTE — ED Notes (Signed)
Pt transported to CT ?

## 2020-01-18 NOTE — Op Note (Signed)
Anne Reese PROCEDURE DATE: 01/18/2020  PREOPERATIVE DIAGNOSES: suspected left ovarian torsion POSTOPERATIVE DIAGNOSES: ruptured left hemorrhagic cyst PROCEDURE: diagnostic laparoscopy, evacuation of hemoperitoneum SURGEON:  Dr. Feliz Beam ASSISTANT: none ANESTHESIOLOGY TEAM: Anesthesiologist: Nolon Nations, MD CRNA: Suzy Bouchard, CRNA  INDICATIONS: 44 y.o. G3P0 with aforementioned preoperative diagnoses here today for definitive surgical management.   Risks of surgery were discussed with the patient including but not limited to: bleeding which may require transfusion or reoperation; infection which may require antibiotics; injury to bowel, bladder, ureters or other surrounding organs; need for additional procedures including laparotomy; thromboembolic phenomenon, incisional problems and other postoperative/anesthesia complications. Written informed consent was obtained.    FINDINGS:  Significant omental adhesions to midline lower abdomen, small amount of blood fluid in cul de sac, uterus surgically absent, right ovary not visualized, bowel adhered to right pelvic sidewall, left ovary adhered to sigmoid colon with ruptured hemorrhagic cyst noted with normal ovarian tissue noted adjacent, ovary adhered to pelvic sidewall and to sigmoid colon, no evidence of torsion. Normal upper abdomen.  ANESTHESIA:    General INTRAVENOUS FLUIDS: 500 ml ESTIMATED BLOOD LOSS: 50 ml SPECIMENS:  none COMPLICATIONS: None immediate   PROCEDURE IN DETAIL:  The patient had sequential compression devices applied to her lower extremities while in the preoperative area.  She was then taken to the operating room where general anesthesia was administered.  She was placed in the dorsal lithotomy position, and was prepped and draped in a sterile manner.  A Foley catheter was inserted into her bladder and attached to constant drainage. A timeout was performed to verify patient and procedure. Attention was then  turned to the patient's abdomen where a 5-mm skin incision was made in the umbilical fold.    The 5 mm Optiview port was advanced under visualization with the endoscope until the peritoneum was entered. The introducer was removed and the camera was replaced within the port to confirm intraperitoneum placement. Once this was accomplished, the carbon dioxide gas was insufflated. The abdomen was visualized and survey of the abdomen showed atraumatic entry. Survey of the abdomen and pelvis were noted as above. The patient was placed into trendelenburg. A 5 mm port was placed into the right lower abdomen 2 cm proximal and median from the right ASIS after incision with a scalpel. The port was advanced under direct visualization. A 10 mm port was placed in the midline horizontally on her pfannenstiel scar after incision with the scalpel. The post was advanced under direct visualization with the laparoscope.    There were significant omental adhesions, however the left ovary was able to be visualized by lifting the omental adhesions out of the pelvis. The pelvis was as noted above with significant omental adhesions to midline lower abdomen, small amount of blood fluid in cul de sac, uterus surgically absent, right ovary not visualized, bowel adhered to right pelvic sidewall, The left ovary was densely adhered to sigmoid colon and pelvic sidewall, the ruptured hemorrhagic cyst was noted to be ovarian in origin (normal appearing ovarian tissue adjacent) and no torsion was visualized. The hemoperitoneum was suctioned and a small amount of irrigation done. Arista placed over the hemorrhagic cyst for a small amount of oozing noted. At this point, the procedure was finished.  The abdomen was again inspected and no bleeding or other abnormalities noted. At this point the procedure was completed and the trocars were removed from the abdomen. The pneumoperitoneum was removed as much as possible. The fascia of the  10 mm port  was closed using 0-Vicryl. The skin of all ports was closed using 4-0 Vicryl. The uterine manipulator was removed and no bleeding noted at the site of the tenaculum.   The patient was extubated without difficulty and taken to PACU in stable condition.   The patient will be discharged to home as per PACU criteria.  Routine postoperative instructions given.  She was prescribed oxycodone.  She will follow up in the clinic in 2 weeks for postoperative evaluation.   Feliz Beam, M.D. Attending Center for [employer] Fish farm manager)

## 2020-01-18 NOTE — Discharge Instructions (Signed)
GO TO ER °

## 2020-01-18 NOTE — ED Notes (Signed)
OR not ready for pt. This RN told to call back in 2 hours.

## 2020-01-18 NOTE — ED Notes (Signed)
Accessed patient's chart to obtain urine culture orders

## 2020-01-18 NOTE — Anesthesia Preprocedure Evaluation (Addendum)
Anesthesia Evaluation  Patient identified by MRN, date of birth, ID band Patient awake    Reviewed: Allergy & Precautions, H&P , NPO status , Patient's Chart, lab work & pertinent test results  History of Anesthesia Complications (+) history of anesthetic complications  Airway Mallampati: III  TM Distance: >3 FB Neck ROM: Full    Dental no notable dental hx. (+) Teeth Intact, Dental Advisory Given   Pulmonary asthma , Current Smoker,    Pulmonary exam normal breath sounds clear to auscultation       Cardiovascular negative cardio ROS Normal cardiovascular exam Rhythm:Regular Rate:Normal     Neuro/Psych negative neurological ROS  negative psych ROS   GI/Hepatic negative GI ROS, Neg liver ROS,   Endo/Other  Morbid obesity  Renal/GU negative Renal ROS  negative genitourinary   Musculoskeletal negative musculoskeletal ROS (+)   Abdominal (+) + obese,   Peds  Hematology negative hematology ROS (+)   Anesthesia Other Findings   Reproductive/Obstetrics Menorrhagia                            Anesthesia Physical Anesthesia Plan  ASA: III  Anesthesia Plan: General   Post-op Pain Management:    Induction: Intravenous  PONV Risk Score and Plan: 4 or greater and Ondansetron, Dexamethasone and Treatment may vary due to age or medical condition  Airway Management Planned: Oral ETT  Additional Equipment: None  Intra-op Plan:   Post-operative Plan: Extubation in OR  Informed Consent: I have reviewed the patients History and Physical, chart, labs and discussed the procedure including the risks, benefits and alternatives for the proposed anesthesia with the patient or authorized representative who has indicated his/her understanding and acceptance.     Dental advisory given  Plan Discussed with: CRNA  Anesthesia Plan Comments:        Anesthesia Quick Evaluation

## 2020-01-18 NOTE — ED Triage Notes (Signed)
C/o LLQ pain since last night with nausea, vomiting, and constipation.  Hx of diverticulitis.

## 2020-01-18 NOTE — Progress Notes (Signed)
Called patient's mother, Anne Reese, and informed her we are done with surgery (patient gave consent in ED). She will be discharged home, her mother will come pick her up when she is ready.   Feliz Beam, M.D. Attending Center for [employer] farm manager)

## 2020-01-18 NOTE — Discharge Instructions (Signed)
Monitored Anesthesia Care, Care After These instructions provide you with information about caring for yourself after your procedure. Your health care provider may also give you more specific instructions. Your treatment has been planned according to current medical practices, but problems sometimes occur. Call your health care provider if you have any problems or questions after your procedure. What can I expect after the procedure? After your procedure, you may:  Feel sleepy for several hours.  Feel clumsy and have poor balance for several hours.  Feel forgetful about what happened after the procedure.  Have poor judgment for several hours.  Feel nauseous or vomit.  Have a sore throat if you had a breathing tube during the procedure. Follow these instructions at home: For at least 24 hours after the procedure:      Have a responsible adult stay with you. It is important to have someone help care for you until you are awake and alert.  Rest as needed.  Do not: ? Participate in activities in which you could fall or become injured. ? Drive. ? Use heavy machinery. ? Drink alcohol. ? Take sleeping pills or medicines that cause drowsiness. ? Make important decisions or sign legal documents. ? Take care of children on your own. Eating and drinking  Follow the diet that is recommended by your health care provider.  If you vomit, drink water, juice, or soup when you can drink without vomiting.  Make sure you have little or no nausea before eating solid foods. General instructions  Take over-the-counter and prescription medicines only as told by your health care provider.  If you have sleep apnea, surgery and certain medicines can increase your risk for breathing problems. Follow instructions from your health care provider about wearing your sleep device: ? Anytime you are sleeping, including during daytime naps. ? While taking prescription pain medicines, sleeping medicines,  or medicines that make you drowsy.  If you smoke, do not smoke without supervision.  Keep all follow-up visits as told by your health care provider. This is important. Contact a health care provider if:  You keep feeling nauseous or you keep vomiting.  You feel light-headed.  You develop a rash.  You have a fever. Get help right away if:  You have trouble breathing. Summary  For several hours after your procedure, you may feel sleepy and have poor judgment.  Have a responsible adult stay with you for at least 24 hours or until you are awake and alert. This information is not intended to replace advice given to you by your health care provider. Make sure you discuss any questions you have with your health care provider. Document Revised: 10/29/2017 Document Reviewed: 11/21/2015 Elsevier Patient Education  [place]. Diagnostic Laparoscopy, Care After This sheet gives you information about how to care for yourself after your procedure. Your health care provider may also give you more specific instructions. If you have problems or questions, contact your health care provider. What can I expect after the procedure? After the procedure, it is common to have:  Mild discomfort in the abdomen.  Sore throat. Women who have laparoscopy with pelvic examination may have mild cramping and fluid coming from the vagina for a few days after the procedure. Follow these instructions at home: Medicines  Take over-the-counter and prescription medicines only as told by your health care provider.  If you were prescribed an antibiotic medicine, take it as told by your health care provider. Do not stop taking the antibiotic even  if you start to feel better. Driving  Do not drive for 24 hours if you were given a medicine to help you relax (sedative) during your procedure.  Do not drive or use heavy machinery while taking prescription pain medicine. Bathing  Do not take baths, swim, or  use a hot tub until your health care provider approves. You may take showers. Incision care   Follow instructions from your health care provider about how to take care of your incisions. Make sure you: ? Wash your hands with soap and water before you change your bandage (dressing). If soap and water are not available, use hand sanitizer. ? Change your dressing as told by your health care provider. ? Leave stitches (sutures), skin glue, or adhesive strips in place. These skin closures may need to stay in place for 2 weeks or longer. If adhesive strip edges start to loosen and curl up, you may trim the loose edges. Do not remove adhesive strips completely unless your health care provider tells you to do that.  Check your incision areas every day for signs of infection. Check for: ? Redness, swelling, or pain. ? Fluid or blood. ? Warmth. ? Pus or a bad smell. Activity  Return to your normal activities as told by your health care provider. Ask your health care provider what activities are safe for you.  Do not lift anything that is heavier than 10 lb (4.5 kg), or the limit that you are told, until your health care provider says that it is safe. General instructions  To prevent or treat constipation while you are taking prescription pain medicine, your health care provider may recommend that you: ? Drink enough fluid to keep your urine pale yellow. ? Take over-the-counter or prescription medicines. ? Eat foods that are high in fiber, such as fresh fruits and vegetables, whole grains, and beans. ? Limit foods that are high in fat and processed sugars, such as fried and sweet foods.  Do not use any products that contain nicotine or tobacco, such as cigarettes and e-cigarettes. If you need help quitting, ask your health care provider.  Keep all follow-up visits as told by your health care provider. This is important. Contact a health care provider if:  You develop shoulder pain.  You feel  lightheaded or faint.  You are unable to pass gas or have a bowel movement.  You feel nauseous or you vomit.  You develop a rash.  You have redness, swelling, or pain around any incision.  You have fluid or blood coming from any incision.  Any incision feels warm to the touch.  You have pus or a bad smell coming from any incision.  You have a fever or chills. Get help right away if:  You have severe pain.  You have vomiting that does not go away.  You have heavy bleeding from the vagina.  Any incision opens.  You have trouble breathing.  You have chest pain. Summary  After the procedure, it is common to have mild discomfort in the abdomen and a sore throat.  Check your incision areas every day for signs of infection.  Return to your normal activities as told by your health care provider. Ask your health care provider what activities are safe for you. This information is not intended to replace advice given to you by your health care provider. Make sure you discuss any questions you have with your health care provider. Document Revised: 07/13/2017 Document Reviewed: 01/24/2017 Elsevier Patient Education  2020 Elsevier Inc.  

## 2020-01-18 NOTE — Anesthesia Procedure Notes (Signed)
Procedure Name: Intubation Date/Time: 01/18/2020 10:05 PM Performed by: Suzy Bouchard, CRNA Pre-anesthesia Checklist: Patient identified, Emergency Drugs available, Patient being monitored, Timeout performed and Suction available Patient Re-evaluated:Patient Re-evaluated prior to induction Oxygen Delivery Method: Circle system utilized Preoxygenation: Pre-oxygenation with 100% oxygen Induction Type: IV induction, Rapid sequence and Cricoid Pressure applied Laryngoscope Size: Miller and 2 Grade View: Grade I Tube type: Oral Tube size: 7.0 mm Number of attempts: 1 Airway Equipment and Method: Stylet Placement Confirmation: positive ETCO2,  ETT inserted through vocal cords under direct vision and breath sounds checked- equal and bilateral Secured at: 21 cm Tube secured with: Tape Dental Injury: Teeth and Oropharynx as per pre-operative assessment

## 2020-01-19 ENCOUNTER — Emergency Department (HOSPITAL_COMMUNITY): Payer: 59

## 2020-01-19 DIAGNOSIS — N83202 Unspecified ovarian cyst, left side: Secondary | ICD-10-CM | POA: Diagnosis not present

## 2020-01-19 DIAGNOSIS — Z9889 Other specified postprocedural states: Secondary | ICD-10-CM

## 2020-01-19 LAB — URINE CULTURE: Culture: NO GROWTH

## 2020-01-19 MED ORDER — OXYCODONE HCL 5 MG PO TABS: 5.0000 mg | ORAL_TABLET | ORAL | Status: DC | PRN

## 2020-01-19 MED ORDER — IBUPROFEN 600 MG PO TABS
600.0000 mg | ORAL_TABLET | Freq: Four times a day (QID) | ORAL | Status: DC | PRN
  Administered 2020-01-19: 600 mg via ORAL
  Filled 2020-01-19: qty 1

## 2020-01-19 MED ORDER — ONDANSETRON HCL 4 MG PO TABS
4.0000 mg | ORAL_TABLET | Freq: Four times a day (QID) | ORAL | Status: DC | PRN
  Administered 2020-01-19: 4 mg via ORAL
  Filled 2020-01-19: qty 1

## 2020-01-19 MED ORDER — HYDROMORPHONE HCL 1 MG/ML IJ SOLN
INTRAMUSCULAR | Status: AC
  Filled 2020-01-19: qty 1

## 2020-01-19 NOTE — Progress Notes (Signed)
Pt walked around the unit this AM. O2 level was 86-88% while ambulating.Denies shortness of breath. Placed back on 3 l/[place], O2 level went back to 92-94%.

## 2020-01-19 NOTE — Plan of Care (Signed)

## 2020-01-19 NOTE — Progress Notes (Signed)
Discharged patient to home, AVS given and explained. Questions answered to patient's satisfaction. Belongings returned accordingly.

## 2020-01-19 NOTE — Discharge Summary (Signed)
Physician Discharge Summary  Patient ID: Anne Reese MRN: [id number] DOB/AGE: [birth year] 44 y.o.  Admit date: 01/18/2020 Discharge date: 01/19/2020  Admission Diagnoses: suspected ovarian torsion  Discharge Diagnoses:  Principal Problem:   Ruptured cyst of left ovary Active Problems:   Hemorrhagic cyst of left ovary   Status post surgery   Discharged Condition: fair  Hospital Course: Please see HPI dated 01/18/2020 for full details. Briefly, this is a 44 y.o. G75P0 female admitted for diagnostic laparoscopy for suspected ovarian torsion. Surgery was uncomplicated and she was found to have a ruptured left ovarian hemorrhagic cyst with no evidence of torsion. She was admitted for observation overnight post operatively as her O2 sats were quite low and she was weaned throughout the night to about 3 L [place] this morning. This am she is eating, ambulating, tolerating regualr diet without nausea/vomiting and has minimal pain. After discussion of the O2 sat issue, she states she always feels "discombobulated first thing in the am" and that she needs 25-30 mins to get herself together and feel normal. She also always feels more tired when she wakes up than when she goes to sleep. Suspect sleep apnea and asked patient to contact her PCP for referral for sleep study. Otherwise doing very well. For discharge home once weaned from O2. She will f/u in office  Medical history significant for hyperlipidemia.  Physical Exam:  BP 130/63 (BP Location: Left Arm)   Pulse 81   Temp 97.9 F (36.6 C)   Resp 17   Ht 5\' 3"  (1.6 m)   Wt 107 kg   LMP 05/19/2013 Comment: Negative HCG in ED today  SpO2 95%   BMI 41.81 kg/m  General: alert, oriented, cooperative Chest: normal respiratory effort with [place] in place Heart: RRR  Abdomen: soft, appropriately tender to palpation, incisions covered by dressing with no evidence of active bleeding DVT Evaluation: no evidence of DVT Extremities: no edema, no calf  tenderness  Labs: Lab Results  Component Value Date   WBC 9.0 01/18/2020   HGB 14.8 01/18/2020   HCT 44.1 01/18/2020   MCV 80.2 01/18/2020   PLT 317 01/18/2020   CMP Latest Ref Rng & Units 01/18/2020  Glucose 70 - 99 mg/dL 96  BUN 6 - 20 mg/dL 10  Creatinine 0.44 - 1.00 mg/dL 0.83  Sodium 135 - 145 mmol/L 138  Potassium 3.5 - 5.1 mmol/L 4.1  Chloride 98 - 111 mmol/L 106  CO2 22 - 32 mmol/L 21(L)  Calcium 8.9 - 10.3 mg/dL 9.1  Total Protein 6.5 - 8.1 g/dL 7.0  Total Bilirubin 0.3 - 1.2 mg/dL 0.8  Alkaline Phos 38 - 126 U/L 65  AST 15 - 41 U/L 16  ALT 0 - 44 U/L 16   Disposition: Discharge disposition: 01-Home or Self Care       Discharge Instructions    Call MD for:  difficulty breathing, headache or visual disturbances   Complete by: As directed    Call MD for:  difficulty breathing, headache or visual disturbances   Complete by: As directed    Call MD for:  extreme fatigue   Complete by: As directed    Call MD for:  extreme fatigue   Complete by: As directed    Call MD for:  hives   Complete by: As directed    Call MD for:  hives   Complete by: As directed    Call MD for:  persistant dizziness or light-headedness   Complete by:  As directed    Call MD for:  persistant dizziness or light-headedness   Complete by: As directed    Call MD for:  persistant nausea and vomiting   Complete by: As directed    Call MD for:  persistant nausea and vomiting   Complete by: As directed    Call MD for:  redness, tenderness, or signs of infection (pain, swelling, redness, odor or green/yellow discharge around incision site)   Complete by: As directed    Call MD for:  redness, tenderness, or signs of infection (pain, swelling, redness, odor or green/yellow discharge around incision site)   Complete by: As directed    Call MD for:  severe uncontrolled pain   Complete by: As directed    Call MD for:  severe uncontrolled pain   Complete by: As directed    Call MD for:   temperature >100.4   Complete by: As directed    Call MD for:  temperature >100.4   Complete by: As directed    Diet - low sodium heart healthy   Complete by: As directed    Diet - low sodium heart healthy   Complete by: As directed    Discharge wound care:   Complete by: As directed    Wash with warm, soapy water. Pat dry, do not scrub.   Discharge wound care:   Complete by: As directed    Wash with warm, soapy water. Pat dry, do not scrub.   Increase activity slowly   Complete by: As directed    Increase activity slowly   Complete by: As directed    May shower / Bathe   Complete by: As directed    May shower / Bathe   Complete by: As directed      An After Visit Summary was printed and given to the patient. Allergies as of 01/19/2020   No Known Allergies     Medication List    TAKE these medications   atorvastatin 40 MG tablet Commonly known as: LIPITOR Take 1 tablet (40 mg total) by mouth daily.   FLUoxetine 40 MG capsule Commonly known as: PROZAC Take 1 capsule (40 mg total) by mouth daily.   ibuprofen 800 MG tablet Commonly known as: ADVIL Take 1 tablet (800 mg total) by mouth 3 (three) times daily. What changed:   when to take this  reasons to take this   oxyCODONE 5 MG immediate release tablet Commonly known as: Roxicodone Take 1 tablet (5 mg total) by mouth every 4 (four) hours as needed for severe pain.   Vitamin D (Ergocalciferol) 1.25 MG (50000 UNIT) Caps capsule Commonly known as: DRISDOL Take 1 capsule (50,000 Units total) by mouth every 7 (seven) days.            Discharge Care Instructions  (From admission, onward)         Start     Ordered   01/19/20 0000  Discharge wound care:    Comments: Wash with warm, soapy water. Pat dry, do not scrub.   01/19/20 0834   01/18/20 0000  Discharge wound care:    Comments: Wash with warm, soapy water. Pat dry, do not scrub.   01/18/20 2336         [facility]up [place] [facility] at [facility]. Schedule an appointment as soon as possible for a visit in 2 week(s).   Specialty: Obstetrics and Gynecology Contact information: [place]  [place] [address] [phone]          Signed: Sloan Leiter 01/19/2020, 8:35 AM

## 2020-01-19 NOTE — Anesthesia Postprocedure Evaluation (Signed)
Anesthesia Post Note  Patient: Anne Reese  Procedure(s) Performed: LAPAROSCOPY DIAGNOSTIC, WITH EVACTION OF HEMOPERITONEUM (N/A )     Patient location during evaluation: PACU Anesthesia Type: General Level of consciousness: sedated and patient cooperative Pain management: pain level controlled Vital Signs Assessment: post-procedure vital signs reviewed and stable Respiratory status: spontaneous breathing Cardiovascular status: stable Comments: Pt with questionable laryngospasm post extubation, with brief desaturation. I immediately arrived in the room. Pt. Snoring loudly, coughing, and had copious oral secretions. SpO2 ~88-91. Given lidocaine and robinol which improved coughing and secretions. In PACU she remained in the low 90's with face mask and continued to cough. CXR ordered. Given her cough and SpO2 I feel she should stay for observation. PACU RN attempting to contact [facility]    Last Vitals:  Vitals:   01/19/20 0000 01/19/20 0020  BP:  127/77  Pulse: 97 93  Resp: (!) 23 (!) 27  Temp:    SpO2: (!) 86% (!) 83%    Last Pain:  Vitals:   01/19/20 0000  TempSrc:   PainSc: 0-No pain                 Nolon Nations

## 2020-02-12 ENCOUNTER — Other Ambulatory Visit: Payer: Self-pay

## 2020-02-12 ENCOUNTER — Encounter: Payer: Self-pay | Admitting: Obstetrics and Gynecology

## 2020-02-12 ENCOUNTER — Ambulatory Visit (INDEPENDENT_AMBULATORY_CARE_PROVIDER_SITE_OTHER): Payer: 59 | Admitting: Obstetrics and Gynecology

## 2020-02-12 VITALS — BP 124/90 | HR 89 | Wt 237.1 lb

## 2020-02-12 DIAGNOSIS — N3946 Mixed incontinence: Secondary | ICD-10-CM | POA: Diagnosis not present

## 2020-02-12 DIAGNOSIS — R159 Full incontinence of feces: Secondary | ICD-10-CM | POA: Diagnosis not present

## 2020-02-12 DIAGNOSIS — Z9889 Other specified postprocedural states: Secondary | ICD-10-CM

## 2020-02-12 MED ORDER — IBUPROFEN 800 MG PO TABS: 800.0000 mg | ORAL_TABLET | Freq: Three times a day (TID) | ORAL | 0 refills | Status: DC | PRN

## 2020-02-12 MED FILL — IBUPROFEN 800 MG TABLET: 800 | 10 days supply | Qty: 30 | Fill #0

## 2020-02-12 NOTE — Progress Notes (Signed)
PHQ-9 today is 14; no SI or thoughts of harm. Pt states she feels her depression is worsening. Anxiety is better managed. Pt reports multiple life events causing added stress including recent move back to [place], recent surgery, and food/housing insecurity. Pt has housing currently with mother. Food resources included in pt information. Follow up appt scheduled with [facility] for 03/17/20 at 0830; staff member states message will be sent to [facility], NP requesting earlier appt. Pt notified.   Apolonio Schneiders RN 02/12/20

## 2020-02-12 NOTE — Progress Notes (Signed)
GYNECOLOGY OFFICE FOLLOW UP NOTE  History:  44 y.o. G3P0 here today for follow up for diagnostic laparoscopic and evacuation of hemoperitoneum on 01/18/20 for suspected torsion, which was a ruptured left hemorrhagic cyst. Doing well but still having some pain on right lower portion of abdomen.   GAD was elevated, to see PCP. Complains of stress and urge incontinence, as well as fecal incontinence with diarrhea for last 6 months. Has not had a formed bowel movement in 6 months and soils herself routinely. Was not a problem prior to this.   Past Medical History:  Diagnosis Date  . Asthma    no inhaler use x 15 yrs  . Complication of anesthesia    asthma attack in PACU  . Diverticulitis   . Fibroids 2013  . Hyperlipidemia     Past Surgical History:  Procedure Laterality Date  . ABDOMINAL HYSTERECTOMY N/A 06/24/2013   Procedure: HYSTERECTOMY ABDOMINAL;  Surgeon: Betsy Coder, MD;  Location: [facility];  Service: Gynecology;  Laterality: N/A;  . BILATERAL SALPINGECTOMY Bilateral 06/24/2013   Procedure: BILATERAL SALPINGECTOMY;  Surgeon: Betsy Coder, MD;  Location: [facility];  Service: Gynecology;  Laterality: Bilateral;  . CYSTOSCOPY N/A 06/24/2013   Procedure: CYSTOSCOPY;  Surgeon: Betsy Coder, MD;  Location: [facility];  Service: Gynecology;  Laterality: N/A;  . DILATION AND CURETTAGE OF UTERUS N/A 06/24/2013   Procedure: DILATATION AND CURETTAGE;  Surgeon: Betsy Coder, MD;  Location: [facility];  Service: Gynecology;  Laterality: N/A;  . GANGLION CYST EXCISION    . LAPAROSCOPY N/A 01/18/2020   Procedure: LAPAROSCOPY DIAGNOSTIC, WITH EVACTION OF HEMOPERITONEUM;  Surgeon: Sloan Leiter, MD;  Location: [facility];  Service: Gynecology;  Laterality: N/A;  . TUBAL LIGATION      Current Outpatient Medications:  .  atorvastatin (LIPITOR) 40 MG tablet, Take 1 tablet (40 mg total) by mouth daily., Disp: 90 tablet, Rfl: 3 .  FLUoxetine (PROZAC) 40 MG capsule, Take 1 capsule (40 mg total) by mouth  daily., Disp: 90 capsule, Rfl: 1 .  ibuprofen (ADVIL) 800 MG tablet, Take 1 tablet (800 mg total) by mouth every 8 (eight) hours as needed., Disp: 30 tablet, Rfl: 0 .  Vitamin D, Ergocalciferol, (DRISDOL) 1.25 MG (50000 UNIT) CAPS capsule, Take 1 capsule (50,000 Units total) by mouth every 7 (seven) days., Disp: 12 capsule, Rfl: 1 .  oxyCODONE (ROXICODONE) 5 MG immediate release tablet, Take 1 tablet (5 mg total) by mouth every 4 (four) hours as needed for severe pain. (Patient not taking: Reported on 02/12/2020), Disp: 20 tablet, Rfl: 0  The following portions of the patient's history were reviewed and updated as appropriate: allergies, current medications, past family history, past medical history, past social history, past surgical history and problem list.   Review of Systems:  Pertinent items noted in HPI and remainder of comprehensive ROS otherwise negative.   Objective:  Physical Exam BP 124/90   Pulse 89   Wt 237 lb 1.6 oz (107.5 kg)   LMP 05/19/2013 Comment: Negative HCG in ED today  BMI 42.00 kg/m  CONSTITUTIONAL: Well-developed, well-nourished female in no acute distress.  HENT:  Normocephalic, atraumatic. External right and left ear normal. Oropharynx is clear and moist EYES: Conjunctivae and EOM are normal. Pupils are equal, round, and reactive to light. No scleral icterus.  NECK: Normal range of motion, supple, no masses SKIN: Skin is warm and dry. No rash noted. Not diaphoretic. No erythema. No pallor. NEUROLOGIC: Alert and oriented to  person, place, and time. Normal reflexes, muscle tone coordination. No cranial nerve deficit noted. PSYCHIATRIC: Normal mood and affect. Normal behavior. Normal judgment and thought content. CARDIOVASCULAR: Normal heart rate noted RESPIRATORY: Effort normal, no problems with respiration noted ABDOMEN: Soft, no distention noted.  Incisions well approximated and healing, sutures cut out of RLQ and umbilical incision PELVIC:  deferred MUSCULOSKELETAL: Normal range of motion. No edema noted.    Assessment & Plan:   1. 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 Incontinence of feces, unspecified fecal incontinence type - Ambulatory referral to Gastroenterology   Routine preventative health maintenance measures emphasized. Please refer to After Visit Summary for other counseling recommendations.   Return in about 6 months (around 08/14/2020) for annual.  Total face-to-face time with patient: 23 minutes. Over 50% of encounter was spent on counseling and coordination of care.  Feliz Beam, M.D. Attending Center for [employer] Fish farm manager)

## 2020-02-12 NOTE — Patient Instructions (Addendum)
[place] [address] [place], [place] [address] [phone]   or  www.http://james-garner.info/ **SNAP/EBT/ Other nutritional benefits  [facility] [address], [place], [place] [address] [phone]  or  https://palmer-smith.com/ **WIC for  women who are pregnant and postpartum, infants and children up to 44 years old  [place] [address], [place], [place] [address] [phone]   or   www.theblessedtable.org  **Food pantry  [facility] [place], [place], [place] [address] [phone]   or   https://brotherkolbes.godaddysites.com  **Emergency food and prepared meals  [place] [address], [place], [place] [address] [phone]   or   www.cedargrovetop.us **Food pantry  [facility] [address], [place], [place] [address] [phone]   or   www.https://hartman-jones.net/ **Food pantry  [employer] [facility] [address] [place], [place] [address] [phone] **Food pantry  [facility] [address], [place], [place] [address] [phone]   or   www.greensborourbanministry.org  Insurance underwriter and prepared meals  [facility] [address] [place], [place], [place], [place] [address] [email or web address]  **Food pantry  [employer] [facility] [address] [place], [place] [address] [phone]   or   www.lbcnow.org  **Food pantry  [facility] [address] [place], [place] [address] [phone]   or   http://patterson-parker.net/ **Food pantry, nutrition education, gardening activities  [place] [address], [place], [place] [address] [phone] **Food pantry  [facility] [place] [address] [place], [place] [address] [phone]   or   www.salvationarmyof[place].[facility] [place], [place], [place] [address] [phone]   or   http://senior-resources-guilford.org [facility] [place] [address], [place],  [address] [phone]   or   www.stmattchurch.com  **Food pantry  [place] [address] [place],  [address] [phone]   or   vandaliapresbyterianchurch.org **Food pantry     Certain foods can irritate your bladder. This can give you the sensation of not emptying well or having sudden, significant urges to go to the bathroom. You can eliminate these foods and see if this improves your symptoms. Try to avoid alcohol, caffeine, citrus and spicy foods.

## 2020-02-17 ENCOUNTER — Telehealth: Payer: Self-pay

## 2020-02-17 NOTE — Telephone Encounter (Signed)
Pt. Is schedule with PCP on 03/17/2020.  Will route to PCP for behavorial health referral.

## 2020-02-17 NOTE — Telephone Encounter (Signed)
Copied from [place] [id number]. Topic: General - Other >> Feb 12, 2020  9:30 AM Oneta Rack wrote: Reason for CRM: patient was seen today by Sloan Leiter, MD / GYN and recommemds patient follow up with PCP to discuss medicationm management. Specialist would like patient seen before 8/4 which is PCP next available appt. Please advise patient directly if she can be worked in earlier as per specialist request. Also specialist would like patient to be scheduled with behavorial health.

## 2020-02-18 ENCOUNTER — Encounter: Payer: Self-pay | Admitting: Nurse Practitioner

## 2020-02-19 ENCOUNTER — Other Ambulatory Visit: Payer: Self-pay | Admitting: Nurse Practitioner

## 2020-02-19 DIAGNOSIS — F419 Anxiety disorder, unspecified: Secondary | ICD-10-CM

## 2020-02-19 DIAGNOSIS — F32A Depression, unspecified: Secondary | ICD-10-CM

## 2020-02-25 ENCOUNTER — Telehealth: Payer: Self-pay

## 2020-02-25 NOTE — Telephone Encounter (Addendum)
Per Dr. Rosana Hoes pt needs to have referral to Urogyn.  Called to schedule pt with Dr. [place] Pink and was informed that the provider is out of network for the pt's insurance [facility]).  [place] at [phone] and was informed that they do take pt's insurance.  [facility] requested to have pt's demographics, insurance, and office notes faxed to them and they will contact the pt with an appt.  I confirmed with pt that she is able travel to [facility].  Pt stated yes she will be able to drive to [facility].  Pt notified that we are going to fax over referral information and that [facility] will contact her with an appt.  I advised pt that if she has not heard from them in a week or two to please them to f/u.  [facility] information sent via Mychart to patient.   Mel Almond, RN  03/09/20

## 2020-03-15 ENCOUNTER — Other Ambulatory Visit: Payer: Self-pay | Admitting: Nurse Practitioner

## 2020-03-15 NOTE — Telephone Encounter (Signed)
Medication Refill - Medication: ibuprofen (ADVIL) 800 MG tablet  Patient stated she is in a lot of pain and would like meds. As soon as possible.   Preferred Pharmacy (with phone number or street name):  [place], [place] [address] Phone:  [phone]  Fax:  [phone]       Agent: Please be advised that RX refills may take up to 3 business days. We ask that you follow-up with your pharmacy.

## 2020-03-16 NOTE — Progress Notes (Addendum)
03/16/2020 Anne Reese [id number] 20-Sep-1975   CHIEF COMPLAINT: Fecal incontinence   HISTORY OF PRESENT ILLNESS: Anne Reese is a 44 year old female with a past medical history of asthma, depression, crack cocaine use (abstinent since 2006), hyperlipidemia and diverticulitis.  Past partial hysterectomy. S/P laparoscopy secondary to a ruptured left ovarian hemorrhagic cyst 01/18/2020.   She was diagnosed with sigmoid diverticulitis confirmed by abdominal/pelvic CT imaging done by [facility] in [place], [place] 05/02/2019.  She was prescribed Cipro 500 mg p.o. twice daily and Flagyl 500 mg 3 times daily for 7 days and she was discharged home.  She presented back to the hospital 2 days later with worsening upper and lower abdominal pain.  WBC was 12.5. Hg 12.8.  She received IV antibiotics for 5 to 7 days then was discharged home.  She did not require further antibiotics.  No recurrence of diverticulitis.  Since then, she has moved back to [place]. She presents to our office today for further evaluation regarding fecal incontinence.  Since her diagnosis and treatment for diverticulitis, she has frequent loose stools with fecal incontinence which has progressively worsened over the past few months.  She can no longer control her bowels.  She has fecal incontinence if she coughs while sitting on the couch.  She passes stool from the rectum when she goes to the bathroom to urinate.  Sometimes she has an urge to pass a controlled bowel movement and other times she does not feel an urge and soils herself.  She is now wearing a depends which is quite frustrating to her.  She typically passes 2-3 loose bowel movements daily.  She is passing a normal formed brown bowel movement once weekly.  She used a fiber supplement in the past which was ineffective.  She reported having a colonoscopy completed 01/2019 (when living in [facility]) due to having loose and hard black stools with bright red  blood on the toilet tissue for approximately 4 to 5 weeks.  She reported having a few colon polyps removed,  the colonoscopy was otherwise normal. She possibly had an EGD at the same time but she is not sure. No further rectal bleeding since completing her colonoscopy.  Maternal grandfather with history of colon cancer. No family history of  IBD.  No recent antibiotics.  She is taking ibuprofen 200 mg 3 tabs 4 times daily for the past 2 weeks. She is having intermittent epigastric pain. No N/V. No heartburn or dysphagia. No weight loss.   Lab 01/18/2020: WBC 9.0 hemoglobin 14.8.  Hematocrit 44.1.  Lipase 19.  CMP was normal.  Abdominal/pelvic CT scan [facility] 05/02/2019 [facility]: 1. Acute diverticulitis of the proximal sigmoid colon with extensive  surrounding inflammation and small amount of likely reactive edema/free  fluid tracking along the left pericolic gutter with trace free fluid in the  pelvis. No abscess or free air.   Abdominal/pelvic CT with contrast 01/18/2020: 1. Noninflamed sigmoid diverticulosis. 2. 2.7 cm left adnexal cyst, likely ovarian in origin.   Past Medical History:  Diagnosis Date  . Asthma    no inhaler use x 15 yrs  . Complication of anesthesia    asthma attack in PACU  . Diverticulitis   . Fibroids 2013  . Hyperlipidemia    Past Surgical History:  Procedure Laterality Date  . ABDOMINAL HYSTERECTOMY N/A 06/24/2013   Procedure: HYSTERECTOMY ABDOMINAL;  Surgeon: Betsy Coder, MD;  Location: [facility];  Service: Gynecology;  Laterality: N/A;  . BILATERAL SALPINGECTOMY Bilateral 06/24/2013   Procedure: BILATERAL SALPINGECTOMY;  Surgeon: Betsy Coder, MD;  Location: [facility];  Service: Gynecology;  Laterality: Bilateral;  . CYSTOSCOPY N/A 06/24/2013   Procedure: CYSTOSCOPY;  Surgeon: Betsy Coder, MD;  Location: [facility];  Service: Gynecology;  Laterality: N/A;  . DILATION AND CURETTAGE OF UTERUS N/A 06/24/2013   Procedure: DILATATION AND CURETTAGE;   Surgeon: Betsy Coder, MD;  Location: [facility];  Service: Gynecology;  Laterality: N/A;  . GANGLION CYST EXCISION    . LAPAROSCOPY N/A 01/18/2020   Procedure: LAPAROSCOPY DIAGNOSTIC, WITH EVACTION OF HEMOPERITONEUM;  Surgeon: Sloan Leiter, MD;  Location: [facility];  Service: Gynecology;  Laterality: N/A;  . 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 She drinks 2 beers once every few monthss.  History of crack cocaine use, clean since 2006.   Family History: Maternal grandfather colon cancer and prostate cancer. Mother with HTN and hypercholesterolemia. MGF and MGM with heart disease. Great maternal aunt had breast and brain cancer.   No Known Allergies    Outpatient Encounter Medications as of 03/17/2020  Medication Sig  . atorvastatin (LIPITOR) 40 MG tablet Take 1 tablet (40 mg total) by mouth daily.  [place] Kitchen FLUoxetine (PROZAC) 40 MG capsule Take 1 capsule (40 mg total) by mouth daily.  Mar[place] Kitchen ibuprofen (ADVIL) 800 MG tablet Take 1 tablet (800 mg total) by mouth every 8 (eight) hours as needed.  [place] Kitchen oxyCODONE (ROXICODONE) 5 MG immediate release tablet Take 1 tablet (5 mg total) by mouth every 4 (four) hours as needed for severe pain. (Patient not taking: Reported on 02/12/2020)  . Vitamin D, Ergocalciferol, (DRISDOL) 1.25 MG (50000 UNIT) CAPS capsule Take 1 capsule (50,000 Units total) by mouth every 7 (seven) days.   No facility-administered encounter medications on file as of 03/17/2020.     REVIEW OF SYSTEMS: All other systems reviewed and negative except where noted in the History of Present Illness.   PHYSICAL EXAM: BP (!) 156/90 (BP Location: Left Arm, Patient Position: Sitting, Cuff Size: Normal)   Pulse 80   Ht 5\' 4"  (1.626 m) Comment: height measured without shoes  Wt 241 lb 2 oz (109.4 kg)   LMP 05/19/2013 Comment: Negative HCG in ED today  BMI 41.39 kg/m     General: Obese female in no acute distress. Head: Normocephalic and atraumatic. Eyes:  Sclerae non-icteric, conjunctive pink. Ears: Normal auditory acuity. Mouth: Dentition intact. No ulcers or lesions.  Neck: Supple, no lymphadenopathy or thyromegaly.  Lungs: Clear bilaterally to auscultation without wheezes, crackles or rhonchi. Heart: Regular rate and rhythm. No murmur, rub or gallop appreciated.  Abdomen: Soft, nontender, non distended. No masses. No hepatosplenomegaly. Normoactive bowel sounds x 4 quadrants.  Rectal: No external hemorrhoids. No significant internal hemorrhoids. No mass. Diminished anal sphincter tone. Olivia Mackie CMA present during exam.  Musculoskeletal: Symmetrical with no gross deformities. Skin: Warm and dry. No rash or lesions on visible extremities. Extremities: No edema. Neurological: Alert oriented x 4, no focal deficits.  Psychological:  Alert and cooperative. Normal mood and affect.  ASSESSMENT AND PLAN:  88. 44 year old female with diarrhea and  fecal incontinence -GI pathogen panel, CBC, CMP, TSH, CRP, tTg and IGA -Benefiber 1 TBSP daily -Pepto bismal 1 to 2 tabs daily as needed to increase anal sphincter tone -Request colonoscopy procedure  and biopsy report from the [facility] -Pelvic floor physical therapy referral  -Follow up with Dr. Silverio Decamp in 4 to 6 weeks  -Patient to call our office if he symptoms worsen  -Consider trial with Xifaxan if GI pathogen panel negative   2. Epigastric pain -Famotidine 20mg  one po bid (PPI deferred for now due to potential concerns for diarrhea side effect) -No NSAIDS recommended  -Discussed EGD if not [facility]  3.  History of colon polyps -Request copy of 01/2019 colonoscopy report   4. Smoker -Advised smoking cessation   ADDENDUM: Records from Dr. Madelyn Brunner received.  EGD and colonoscopy were done 01/23/2019: EGD showed mild antral gastropathy s/p  biopsies for H. Pylori. Otherwise normal EGD.  Colonoscopy: Fair prep. Diminutive sessile polp s/p cold forcep biopsy.  Small internal hemorrhoids.  Repeat colonoscopy in 3 years.     CC:  Gildardo Pounds, NP

## 2020-03-17 ENCOUNTER — Encounter: Payer: Self-pay | Admitting: Nurse Practitioner

## 2020-03-17 ENCOUNTER — Other Ambulatory Visit: Payer: Self-pay | Admitting: Nurse Practitioner

## 2020-03-17 ENCOUNTER — Other Ambulatory Visit (INDEPENDENT_AMBULATORY_CARE_PROVIDER_SITE_OTHER): Payer: 59

## 2020-03-17 ENCOUNTER — Ambulatory Visit: Payer: 59 | Attending: Nurse Practitioner | Admitting: Nurse Practitioner

## 2020-03-17 ENCOUNTER — Other Ambulatory Visit: Payer: Self-pay

## 2020-03-17 ENCOUNTER — Ambulatory Visit (INDEPENDENT_AMBULATORY_CARE_PROVIDER_SITE_OTHER): Payer: 59 | Admitting: Nurse Practitioner

## 2020-03-17 VITALS — BP 156/90 | HR 80 | Ht 64.0 in | Wt 241.1 lb

## 2020-03-17 VITALS — BP 164/106 | HR 77 | Temp 97.7°F | Wt 241.0 lb

## 2020-03-17 DIAGNOSIS — F32A Depression, unspecified: Secondary | ICD-10-CM

## 2020-03-17 DIAGNOSIS — F17200 Nicotine dependence, unspecified, uncomplicated: Secondary | ICD-10-CM

## 2020-03-17 DIAGNOSIS — R1013 Epigastric pain: Secondary | ICD-10-CM

## 2020-03-17 DIAGNOSIS — F329 Major depressive disorder, single episode, unspecified: Secondary | ICD-10-CM

## 2020-03-17 DIAGNOSIS — R159 Full incontinence of feces: Secondary | ICD-10-CM | POA: Insufficient documentation

## 2020-03-17 DIAGNOSIS — F419 Anxiety disorder, unspecified: Secondary | ICD-10-CM | POA: Diagnosis not present

## 2020-03-17 DIAGNOSIS — K089 Disorder of teeth and supporting structures, unspecified: Secondary | ICD-10-CM | POA: Diagnosis not present

## 2020-03-17 DIAGNOSIS — R197 Diarrhea, unspecified: Secondary | ICD-10-CM | POA: Diagnosis not present

## 2020-03-17 LAB — CBC WITH DIFFERENTIAL/PLATELET
Basophils Absolute: 0.1 10*3/uL (ref 0.0–0.1)
Basophils Relative: 0.9 % (ref 0.0–3.0)
Eosinophils Absolute: 0.4 10*3/uL (ref 0.0–0.7)
Eosinophils Relative: 4.3 % (ref 0.0–5.0)
HCT: 40.4 % (ref 36.0–46.0)
Hemoglobin: 13.2 g/dL (ref 12.0–15.0)
Lymphocytes Relative: 28 % (ref 12.0–46.0)
Lymphs Abs: 2.3 10*3/uL (ref 0.7–4.0)
MCHC: 32.7 g/dL (ref 30.0–36.0)
MCV: 81.3 fl (ref 78.0–100.0)
Monocytes Absolute: 0.7 10*3/uL (ref 0.1–1.0)
Monocytes Relative: 8.4 % (ref 3.0–12.0)
Neutro Abs: 4.9 10*3/uL (ref 1.4–7.7)
Neutrophils Relative %: 58.4 % (ref 43.0–77.0)
Platelets: 212 10*3/uL (ref 150.0–400.0)
RBC: 4.96 Mil/uL (ref 3.87–5.11)
RDW: 15.1 % (ref 11.5–15.5)
WBC: 8.4 10*3/uL (ref 4.0–10.5)

## 2020-03-17 LAB — TSH: TSH: 1.54 u[IU]/mL (ref 0.35–4.50)

## 2020-03-17 LAB — IGA: IgA: 164 mg/dL (ref 68–378)

## 2020-03-17 LAB — C-REACTIVE PROTEIN: CRP: 2.4 mg/dL (ref 0.5–20.0)

## 2020-03-17 MED ORDER — ESCITALOPRAM OXALATE 10 MG PO TABS: 10.0000 mg | ORAL_TABLET | Freq: Every day | ORAL | 3 refills | Status: DC

## 2020-03-17 MED ORDER — TRAMADOL HCL 50 MG PO TABS: 50.0000 mg | ORAL_TABLET | Freq: Three times a day (TID) | ORAL | 0 refills | Status: AC | PRN

## 2020-03-17 MED ORDER — NICOTINE 14 MG/24HR TD PT24: 14.0000 mg | MEDICATED_PATCH | Freq: Every day | TRANSDERMAL | 0 refills | Status: AC

## 2020-03-17 MED ORDER — IBUPROFEN 800 MG PO TABS: 800.0000 mg | ORAL_TABLET | Freq: Three times a day (TID) | ORAL | 1 refills | Status: DC | PRN

## 2020-03-17 MED ORDER — NICOTINE 21 MG/24HR TD PT24: 21.0000 mg | MEDICATED_PATCH | Freq: Every day | TRANSDERMAL | 0 refills | Status: AC

## 2020-03-17 MED ORDER — FAMOTIDINE 20 MG PO TABS
20.0000 mg | ORAL_TABLET | Freq: Two times a day (BID) | ORAL | 1 refills | Status: DC
Start: 2020-03-17 — End: 2020-11-23

## 2020-03-17 MED ORDER — NICOTINE 7 MG/24HR TD PT24: 7.0000 mg | MEDICATED_PATCH | Freq: Every day | TRANSDERMAL | 0 refills | Status: DC

## 2020-03-17 MED FILL — FAMOTIDINE 20 MG TABS: 20 | 30 days supply | Qty: 60 | Fill #0

## 2020-03-17 MED FILL — IBUPROFEN 800 MG TABLET: 800 | 20 days supply | Qty: 60 | Fill #0

## 2020-03-17 MED FILL — NICOTINE 21 MG/24HR PATCH: 21 | 28 days supply | Qty: 28 | Fill #0

## 2020-03-17 MED FILL — traMADol HCL 50 MG TABS: 50 | 7 days supply | Qty: 21 | Fill #0

## 2020-03-17 MED FILL — ESCITALOPRAM 10 MG TABLET: 10 | 30 days supply | Qty: 30 | Fill #0

## 2020-03-17 NOTE — Progress Notes (Signed)
Assessment & Plan:  Anne Reese was seen today for medication management.  Diagnoses and all orders for this visit:  Anxiety and depression -     escitalopram (LEXAPRO) 10 MG tablet; Take 1 tablet (10 mg total) by mouth daily.  Tobacco dependence -     nicotine (NICODERM CQ - DOSED IN MG/24 HOURS) 21 mg/24hr patch; Place 1 patch (21 mg total) onto the skin daily. -     nicotine (NICODERM CQ - DOSED IN MG/24 HOURS) 14 mg/24hr patch; Place 1 patch (14 mg total) onto the skin daily for 28 days. (Patient not taking: Reported on 03/17/2020) -     nicotine (NICODERM CQ - DOSED IN MG/24 HR) 7 mg/24hr patch; Place 1 patch (7 mg total) onto the skin daily. (Patient not taking: Reported on 03/17/2020) Anne Reese was counseled on the dangers of tobacco use, and was advised to quit. Reviewed strategies to maximize success, including removing cigarettes and smoking materials from environment, stress management and support of family/friends as well as pharmacological alternatives including: Wellbutrin, Chantix, Nicotine patch, Nicotine gum or lozenges. Smoking cessation support: smoking cessation hotline: 1-800-QUIT-NOW.  Smoking cessation classes are also available through [facility]. Call [phone] or visit our website at [email or web address]   A total of 3 minutes was spent on counseling for smoking cessation and Anne Reese is ready to quit.   Poor dentition -     Ambulatory referral to Dentistry -     traMADol (ULTRAM) 50 MG tablet; Take 1 tablet (50 mg total) by mouth every 8 (eight) hours as needed for up to 14 days. -     ibuprofen (ADVIL) 800 MG tablet; Take 1 tablet (800 mg total) by mouth every 8 (eight) hours as needed.    Patient has been counseled on age-appropriate routine health concerns for screening and prevention. These are reviewed and up-to-date. Referrals have been placed accordingly. Immunizations are up-to-date or declined.    Subjective:   Chief Complaint  Patient  presents with  . Medication Management    Pt. would like to talk to PCP regarding her anxiety medication. Pt. stated the medication works for the anxiety but not for depression.    HPI Anne Reese 44 y.o. female presents after being referred by her OB/GYN for elevated GAD7. PER patient report to GYN she had not experienced a formed bowel movement in 6 months with leakage of stool routinely. She was referred to Urogynecology and Gastroenterology for GU/GI concerns and referred back to me for GAD7.She has an appt today at 930 with GI. She also has a history of chronic constipation per her report to me on 12-22-2019. Stated she had seen GI when she was living in [place] and at that time was instructed to take miralax and "2 stool softeners" as she was only having a bowel movement once a week.  Anxiety and Depression She is currently taking prozac 40 mg. States it has not been effective. She has tried sertraline in the past as well. Will try lexapro 10 mg today  Depression screen [facility] 2/9 03/17/2020 02/12/2020 12/22/2019 08/27/2019 07/16/2019  Decreased Interest 2 2 3  0 1  Down, Depressed, Hopeless 3 2 3 3 3   PHQ - 2 Score 5 4 6 3 4   Altered sleeping 3 2 0 0 1  Tired, decreased energy 3 2 1 2 2   Change in appetite 3 2 0 3 3  Feeling bad or failure about yourself  1 2 1 2 3   Trouble concentrating 0 2 1 0 3  Moving slowly or fidgety/restless 0 0 0 1 0  Suicidal thoughts 0 0 0 0 0  PHQ-9 Score 15 14 9 11 16   Difficult doing work/chores - - - Somewhat difficult -   GAD 7 : Generalized Anxiety Score 03/17/2020 02/12/2020 12/22/2019 08/27/2019  Nervous, Anxious, on Edge 2 1 3 3   Control/stop worrying 3 1 2 1   Worry too much - different things 3 1 2 3   Trouble relaxing  2 1 1  0  Restless 0 0 1 0  Easily annoyed or irritable 2 1 1 2   Afraid - awful might happen 2 1 1 3   Total GAD 7 Score 14 6 11 12   Anxiety Difficulty - - - Somewhat difficult    States she has a broken tooth that is painful and needs to be extracted. Requesting referral to the dentist.    Tobacco Dependence Smoking 1ppd of cigarettes. Ready to quit. Interested in nicotine patches today.  Review of Systems  Constitutional: Negative for fever, malaise/fatigue and weight loss.  HENT: Negative.  Negative for nosebleeds.   Eyes: Negative.  Negative for blurred vision, double vision and photophobia.  Respiratory: Negative.  Negative for cough and shortness of breath.   Cardiovascular: Negative.  Negative for chest pain, palpitations and leg swelling.  Gastrointestinal: Positive for heartburn. Negative for nausea and vomiting.  Musculoskeletal: Negative.  Negative for myalgias.  Neurological: Negative.  Negative for dizziness, focal weakness, seizures and headaches.  Psychiatric/Behavioral: Positive for depression. Negative for suicidal ideas. The patient is nervous/anxious.     Past Medical History:  Diagnosis Date  . Asthma    no inhaler use x 15 yrs  . Complication of anesthesia    asthma attack in PACU  . Depression   . Diverticulitis   . Fibroids 2013  . Hyperlipidemia     Past Surgical History:  Procedure Laterality Date  . ABDOMINAL HYSTERECTOMY N/A 06/24/2013   Procedure: HYSTERECTOMY ABDOMINAL;  Surgeon: Betsy Coder, MD;  Location: [facility];  Service: Gynecology;  Laterality: N/A;  . BILATERAL SALPINGECTOMY Bilateral 06/24/2013   Procedure: BILATERAL SALPINGECTOMY;  Surgeon: Betsy Coder, MD;  Location: [facility];  Service: Gynecology;  Laterality: Bilateral;  . CYSTOSCOPY N/A 06/24/2013   Procedure: CYSTOSCOPY;  Surgeon: Betsy Coder, MD;  Location: [facility];  Service: Gynecology;  Laterality: N/A;  . DILATION AND CURETTAGE OF UTERUS N/A 06/24/2013   Procedure:  DILATATION AND CURETTAGE;  Surgeon: Betsy Coder, MD;  Location: [facility];  Service: Gynecology;  Laterality: N/A;  . GANGLION CYST EXCISION Left   . LAPAROSCOPY N/A 01/18/2020   Procedure: LAPAROSCOPY DIAGNOSTIC, WITH EVACTION OF HEMOPERITONEUM;  Surgeon: Sloan Leiter, MD;  Location: [facility];  Service: Gynecology;  Laterality: N/A;  . TUBAL LIGATION      Family History  Problem Relation  Age of Onset  . Hypertension Maternal Grandfather   . Colon cancer Maternal Grandfather   . Prostate cancer Maternal Grandfather   . Hypertension Mother   . Sickle cell anemia Maternal Grandmother   . Hypertension Maternal Grandmother   . Heart disease Maternal Grandmother   . Breast cancer Other        maternal great aunt  . Brain cancer Other        "                "       "  . HIV Brother   . Lupus Daughter     Social History Reviewed with no changes to be made today.   Outpatient Medications Prior to Visit  Medication Sig Dispense Refill  . atorvastatin (LIPITOR) 40 MG tablet Take 1 tablet (40 mg total) by mouth daily. 90 tablet 3  . Vitamin D, Ergocalciferol, (DRISDOL) 1.25 MG (50000 UNIT) CAPS capsule Take 1 capsule (50,000 Units total) by mouth every 7 (seven) days. 12 capsule 1  . FLUoxetine (PROZAC) 40 MG capsule Take 1 capsule (40 mg total) by mouth daily. 90 capsule 1  . ibuprofen (ADVIL) 800 MG tablet Take 1 tablet (800 mg total) by mouth every 8 (eight) hours as needed. 30 tablet 0  . oxyCODONE (ROXICODONE) 5 MG immediate release tablet Take 1 tablet (5 mg total) by mouth every 4 (four) hours as needed for severe pain. (Patient not taking: Reported on 02/12/2020) 20 tablet 0   No facility-administered medications prior to visit.    No Known Allergies     Objective:    BP (!) 164/106 (BP Location: Left Arm, Patient Position: Sitting, Cuff Size: Normal)   Pulse 77   Temp 97.7 F (36.5 C) (Temporal)   Wt 241 lb (109.3 kg)   LMP 05/19/2013 Comment: Negative HCG in ED today  SpO2  98%   BMI 42.69 kg/m  Wt Readings from Last 3 Encounters:  03/17/20 241 lb 2 oz (109.4 kg)  03/17/20 241 lb (109.3 kg)  02/12/20 237 lb 1.6 oz (107.5 kg)    Physical Exam Vitals and nursing note reviewed.  Constitutional:      Appearance: She is well-developed.  HENT:     Head: Normocephalic and atraumatic.     Mouth/Throat:     Dentition: Abnormal dentition. Dental caries present.  Cardiovascular:     Rate and Rhythm: Normal rate and regular rhythm.     Heart sounds: Normal heart sounds. No murmur heard.  No friction rub. No gallop.   Pulmonary:     Effort: Pulmonary effort is normal. No tachypnea or respiratory distress.     Breath sounds: Normal breath sounds. No decreased breath sounds, wheezing, rhonchi or rales.  Chest:     Chest wall: No tenderness.  Abdominal:     General: Bowel sounds are normal.     Palpations: Abdomen is soft.  Musculoskeletal:        General: Normal range of motion.     Cervical back: Normal range of motion.  Skin:    General: Skin is warm and dry.  Neurological:     Mental Status: She is alert and oriented to person, place, and time.     Coordination: Coordination normal.  Psychiatric:        Behavior: Behavior normal. Behavior is cooperative.        Thought Content: Thought content normal.        Judgment: Judgment normal.  Patient has been counseled extensively about nutrition and exercise as well as the importance of adherence with medications and regular follow-up. The patient was given clear instructions to go to ER or return to medical center if symptoms don't improve, worsen or new problems develop. The patient verbalized understanding.   Follow-up: Return in about 3 weeks (around 04/07/2020) for Anne Reese.   Gildardo Pounds, FNP-BC [facility] and [place], [place]   03/17/2020, 8:31 PM

## 2020-03-17 NOTE — Patient Instructions (Addendum)
If you are age 44 or older, your body mass index should be between 23-30. Your Body mass index is 41.39 kg/m. If this is out of the aforementioned range listed, please consider follow up with your Primary Care Provider.  If you are age 64 or younger, your body mass index should be between 19-25. Your Body mass index is 41.39 kg/m. If this is out of the aformentioned range listed, please consider follow up with your Primary Care Provider.   Please purchase the following medications over the counter and take as directed: Benefiber 1 tablespon daily as tolerated. Peptobismal 1- 2 tablets daily to reduce fecal incontinence.  We have placed a referral for pelvic floor therapy, they will contact you to schedule.  We have sent the following medications to your pharmacy for you to pick up at your convenience: Famotidine 20mg  1 tablet twice a day   Follow up with Dr Silverio Decamp on 05/18/2020 at 9:50am  Due to recent changes in healthcare laws, you may see the results of your imaging and laboratory studies on MyChart before your provider has had a chance to review them.  We understand that in some cases there may be results that are confusing or concerning to you. Not all laboratory results come back in the same time frame and the provider may be waiting for multiple results in order to interpret others.  Please give [place] 48 hours in order for your provider to thoroughly review all the results before contacting the office for clarification of your results.

## 2020-03-19 LAB — TISSUE TRANSGLUTAMINASE, IGA: (tTG) Ab, IgA: 1 U/mL

## 2020-03-24 ENCOUNTER — Telehealth: Payer: Self-pay

## 2020-03-24 MED FILL — OXYBUTYNIN CL ER 10 MG TAB: 10 | 30 days supply | Qty: 30 | Fill #0

## 2020-03-24 MED FILL — FAMOTIDINE 20 MG TABS: 20 | 30 days supply | Qty: 60 | Fill #0

## 2020-03-24 NOTE — Telephone Encounter (Signed)
Pt states [facility] advised pt to ask PCP to order sleep study for Urinary frequency at night.

## 2020-03-25 ENCOUNTER — Other Ambulatory Visit: Payer: Self-pay | Admitting: Nurse Practitioner

## 2020-03-25 DIAGNOSIS — G4733 Obstructive sleep apnea (adult) (pediatric): Secondary | ICD-10-CM

## 2020-03-29 NOTE — Progress Notes (Signed)
Reviewed and agree with documentation and assessment and plan. K. Veena Deetta Siegmann , MD   

## 2020-04-07 ENCOUNTER — Telehealth: Payer: Self-pay

## 2020-04-07 ENCOUNTER — Telehealth: Payer: 59 | Admitting: Nurse Practitioner

## 2020-04-07 NOTE — Telephone Encounter (Signed)
CMP order in epic. Unable to leave message to tell patient to come have drawn. Her voice mail not set up yet. Will try to reach her later.

## 2020-04-07 NOTE — Progress Notes (Signed)
Unable to reach patient , voice mail not set up, will try again later.

## 2020-04-07 NOTE — Telephone Encounter (Signed)
-----   Message from Noralyn Pick, NP sent at 04/06/2020  7:38 PM EDT ----- Olivia Mackie, pls contact patient to have CMP done, most likely needs new order. Thx  ----- Message ----- From: SYSTEM Sent: 03/22/2020  12:11 AM EDT To: Noralyn Pick, NP

## 2020-04-07 NOTE — Telephone Encounter (Signed)
Patient informed and will come get the CMET drawn, order is in epic.

## 2020-04-14 ENCOUNTER — Telehealth: Payer: Self-pay | Admitting: Nurse Practitioner

## 2020-04-14 NOTE — Telephone Encounter (Signed)
Pt states she told Zelda at last visit about her tooth pain.  Now her jaw is swollen and she is asking for abx please.  She thinks it is infected/abcessed.  [place] [place] [employer] Phone:  [phone]  Fax:  [phone]

## 2020-04-15 NOTE — Telephone Encounter (Signed)
Spoke to patient and informed she need to be evaluated by urgent care or a dental office if she is having bad jaw pain and tooth pain. Pt. Understood and will head to urgent care.

## 2020-04-16 ENCOUNTER — Ambulatory Visit (HOSPITAL_COMMUNITY)
Admission: EM | Admit: 2020-04-16 | Discharge: 2020-04-16 | Disposition: A | Discharge status: Home / Self Care | Payer: 59 | Attending: Family Medicine | Admitting: Family Medicine

## 2020-04-16 ENCOUNTER — Other Ambulatory Visit: Payer: Self-pay

## 2020-04-16 ENCOUNTER — Encounter (HOSPITAL_COMMUNITY): Payer: Self-pay | Admitting: *Deleted

## 2020-04-16 DIAGNOSIS — K0889 Other specified disorders of teeth and supporting structures: Secondary | ICD-10-CM

## 2020-04-16 DIAGNOSIS — R220 Localized swelling, mass and lump, head: Secondary | ICD-10-CM

## 2020-04-16 MED ORDER — AMOXICILLIN-POT CLAVULANATE 875-125 MG PO TABS
1.0000 | ORAL_TABLET | Freq: Two times a day (BID) | ORAL | 0 refills | Status: DC
Start: 2020-04-16 — End: 2020-05-22

## 2020-04-16 MED ORDER — LIDOCAINE VISCOUS HCL 2 % MT SOLN
5.0000 mL | OROMUCOSAL | 0 refills | Status: DC | PRN
Start: 2020-04-16 — End: 2020-07-14

## 2020-04-16 MED FILL — LIDOCAINE 2% VISCOUS SOLN: 2 | 20 days supply | Qty: 100 | Fill #0

## 2020-04-16 MED FILL — AMOX-CLAV 875-125 MG TABLET: 875-125 | 7 days supply | Qty: 14 | Fill #0

## 2020-04-16 NOTE — ED Triage Notes (Signed)
Patient reports has hole in right lower tooth. States yesterday started with some discomfort and today has swelling and pain.

## 2020-04-16 NOTE — Discharge Instructions (Signed)
You can dab or gargle with the lidocaine solution for pain as needed, salt water gargles several times daily to keep mouth clean, and complete the antibiotics. Get scheduled with a dentist as soon as possible

## 2020-04-16 NOTE — ED Provider Notes (Signed)
[place]    CSN: [id number] Arrival date & time: 04/16/20  1222      History   Chief Complaint Chief Complaint  Patient presents with  . Dental Pain    HPI Anne Reese is a 44 y.o. female.   Patient here today for about a week of right lower dental pain with swelling into right cheek. Also having some external itching in that area of the cheek since onset. Pain is made worse with chewing or hot/cold liquids. Was taking some tramadol given by PCP but has run out of this. Otherwise, not trying anything for sxs. Denies fever, chills, dysphagia, SOB, N/V. Does not have a dentist.      Past Medical History:  Diagnosis Date  . Asthma    no inhaler use x 15 yrs  . Complication of anesthesia    asthma attack in PACU  . Depression   . Diverticulitis   . Fibroids 2013  . Hyperlipidemia     Patient Active Problem List   Diagnosis Date Noted  . Fecal incontinence 03/17/2020  . Diarrhea 03/17/2020  . Status post surgery 01/19/2020  . Ruptured cyst of left ovary 01/19/2020  . Hemorrhagic cyst of left ovary 01/18/2020  . 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 ABDOMINAL HYSTERECTOMY N/A 06/24/2013   Procedure: HYSTERECTOMY ABDOMINAL;  Surgeon: Betsy Coder, MD;  Location: [facility];  Service: Gynecology;  Laterality: N/A;  . BILATERAL SALPINGECTOMY Bilateral 06/24/2013   Procedure: BILATERAL SALPINGECTOMY;  Surgeon: Betsy Coder, MD;  Location: [facility];  Service: Gynecology;  Laterality: Bilateral;  . CYSTOSCOPY N/A 06/24/2013   Procedure: CYSTOSCOPY;  Surgeon: Betsy Coder, MD;  Location: [facility];  Service: Gynecology;  Laterality: N/A;  . DILATION AND CURETTAGE OF UTERUS N/A 06/24/2013   Procedure: DILATATION AND CURETTAGE;  Surgeon: Betsy Coder, MD;  Location: [facility];  Service: Gynecology;  Laterality: N/A;  . GANGLION CYST EXCISION Left   . LAPAROSCOPY N/A 01/18/2020    Procedure: LAPAROSCOPY DIAGNOSTIC, WITH EVACTION OF HEMOPERITONEUM;  Surgeon: Sloan Leiter, MD;  Location: [facility];  Service: Gynecology;  Laterality: N/A;  . TUBAL LIGATION      OB History    Gravida  3   Para      Term      Preterm      AB      Living  3     SAB      TAB      Ectopic      Multiple      Live Births               Home Medications    Prior to Admission medications   Medication Sig Start Date End Date Taking? Authorizing Provider  amoxicillin-clavulanate (AUGMENTIN) 875-125 MG tablet Take 1 tablet by mouth every 12 (twelve) hours. 04/16/20   Volney American, PA-C  atorvastatin (LIPITOR) 40 MG tablet Take 1 tablet (40 mg total) by mouth daily. 12/22/19   Gildardo Pounds, NP  escitalopram (LEXAPRO) 10 MG tablet Take 1 tablet (10 mg total) by mouth daily. 03/17/20 04/16/20  Gildardo Pounds, NP  famotidine (PEPCID) 20 MG tablet Take 1 tablet (20 mg total) by mouth 2 (two) times daily. 03/17/20   Noralyn Pick, NP  ibuprofen (ADVIL) 800 MG tablet Take 1 tablet (800 mg total) by mouth every 8 (eight) hours as  needed. 03/17/20   Gildardo Pounds, NP  lidocaine (XYLOCAINE) 2 % solution Use as directed 5 mLs in the mouth or throat as needed for mouth pain. 04/16/20   Volney American, PA-C  nicotine (NICODERM CQ - DOSED IN MG/24 HOURS) 14 mg/24hr patch Place 1 patch (14 mg total) onto the skin daily for 28 days. Patient not taking: Reported on 03/17/2020 04/29/20 05/27/20  Gildardo Pounds, NP  nicotine (NICODERM CQ - DOSED IN MG/24 HOURS) 21 mg/24hr patch Place 1 patch (21 mg total) onto the skin daily. 03/17/20 04/28/20  Gildardo Pounds, NP  nicotine (NICODERM CQ - DOSED IN MG/24 HR) 7 mg/24hr patch Place 1 patch (7 mg total) onto the skin daily. Patient not taking: Reported on 03/17/2020 05/28/20   Gildardo Pounds, NP  Vitamin D, Ergocalciferol, (DRISDOL) 1.25 MG (50000 UNIT) CAPS capsule Take 1 capsule (50,000 Units total) by mouth every 7 (seven)  days. 12/22/19   Gildardo Pounds, NP    Family History Family History  Problem Relation Age of Onset  . Hypertension Maternal Grandfather   . Colon cancer Maternal Grandfather   . Prostate cancer Maternal Grandfather   . Hypertension Mother   . Sickle cell anemia Maternal Grandmother   . Hypertension Maternal Grandmother   . Heart disease Maternal Grandmother   . Breast cancer Other        maternal great aunt  . Brain cancer Other        "                "       "  . HIV Brother   . Lupus Daughter     Social History Social History   Tobacco Use  . Smoking status: Current Every Day Smoker    Packs/day: 1.00    Types: Cigarettes  . Smokeless tobacco: Never Used  Vaping Use  . Vaping Use: Never used  Substance Use Topics  . Alcohol use: Yes    Comment: occasional  . Drug use: No     Allergies   Patient has no known allergies.   Review of Systems Review of Systems PER HPI  Physical Exam Triage Vital Signs ED Triage Vitals  Enc Vitals Group     BP 04/16/20 1333 (!) 161/102     Pulse Rate 04/16/20 1333 88     Resp 04/16/20 1333 17     Temp 04/16/20 1333 98.5 F (36.9 C)     Temp Source 04/16/20 1333 Oral     SpO2 04/16/20 1333 97 %     Weight --      Height --      Head Circumference --      Peak Flow --      Pain Score 04/16/20 1347 8     Pain Loc --      Pain Edu? --      Excl. in [place]? --    No data found.  Updated Vital Signs BP (!) 161/102 (BP Location: Right Arm)   Pulse 88   Temp 98.5 F (36.9 C) (Oral)   Resp 17   LMP 05/19/2013 Comment: Negative HCG in ED today  SpO2 97%   Visual Acuity Right Eye Distance:   Left Eye Distance:   Bilateral Distance:    Right Eye Near:   Left Eye Near:    Bilateral Near:     Physical Exam Vitals and nursing note reviewed.  Constitutional:  Appearance: Normal appearance. She is not ill-appearing.  HENT:     Head: Atraumatic.     Right Ear: Tympanic membrane normal.     Left Ear: Tympanic  membrane normal.     Nose: Nose normal.     Mouth/Throat:     Mouth: Mucous membranes are moist.     Pharynx: Oropharynx is clear.     Comments: Right lower gingival irritation surrounding middle posterior molars, semi fluctuant area of swelling palpable right cheek. No active drainage appreciable Eyes:     Extraocular Movements: Extraocular movements intact.     Conjunctiva/sclera: Conjunctivae normal.  Cardiovascular:     Rate and Rhythm: Normal rate and regular rhythm.     Heart sounds: Normal heart sounds.  Pulmonary:     Effort: Pulmonary effort is normal.     Breath sounds: Normal breath sounds.  Musculoskeletal:        General: Normal range of motion.     Cervical back: Normal range of motion and neck supple.  Skin:    General: Skin is warm and dry.  Neurological:     Mental Status: She is alert and oriented to person, place, and time.  Psychiatric:        Mood and Affect: Mood normal.        Thought Content: Thought content normal.        Judgment: Judgment normal.      UC Treatments / Results  Labs (all labs ordered are listed, but only abnormal results are displayed) Labs Reviewed - No data to display  EKG   Radiology No results found.  Procedures Procedures (including critical care time)  Medications Ordered in UC Medications - No data to display  Initial Impression / Assessment and Plan / UC Course  I have reviewed the triage vital signs and the nursing notes.  Pertinent labs & imaging results that were available during my care of the patient were reviewed by me and considered in my medical decision making (see chart for details).     Suspect dental infection, tx with augmentin, salt water rinses, and viscous lidocaine prn for pain relief. OTC pain relievers recommended additionally as needed. F/u with dentist right away. Keep cheeks moisturized and can use hydrocortisone cream prn for itching, suspect this is related to the edema in that area causing  skin stretch/irritation  Final Clinical Impressions(s) / UC Diagnoses   Final diagnoses:  Pain, dental  Mouth swelling     Discharge Instructions     You can dab or gargle with the lidocaine solution for pain as needed, salt water gargles several times daily to keep mouth clean, and complete the antibiotics. Get scheduled with a dentist as soon as possible    ED Prescriptions    Medication Sig Dispense Auth. Provider   amoxicillin-clavulanate (AUGMENTIN) 875-125 MG tablet Take 1 tablet by mouth every 12 (twelve) hours. 14 tablet Volney American, [place]   lidocaine (XYLOCAINE) 2 % solution Use as directed 5 mLs in the mouth or throat as needed for mouth pain. 100 mL Volney American, [place]     PDMP not reviewed this encounter.   Volney American, [place] 04/16/20 1356

## 2020-04-22 ENCOUNTER — Other Ambulatory Visit: Payer: Self-pay

## 2020-04-22 ENCOUNTER — Encounter (HOSPITAL_COMMUNITY): Payer: Self-pay

## 2020-04-22 ENCOUNTER — Ambulatory Visit (HOSPITAL_COMMUNITY)
Admission: EM | Admit: 2020-04-22 | Discharge: 2020-04-22 | Disposition: A | Discharge status: Home / Self Care | Payer: 59 | Attending: Emergency Medicine | Admitting: Emergency Medicine

## 2020-04-22 DIAGNOSIS — H1013 Acute atopic conjunctivitis, bilateral: Secondary | ICD-10-CM | POA: Insufficient documentation

## 2020-04-22 DIAGNOSIS — Z20822 Contact with and (suspected) exposure to covid-19: Secondary | ICD-10-CM | POA: Diagnosis not present

## 2020-04-22 DIAGNOSIS — H6981 Other specified disorders of Eustachian tube, right ear: Secondary | ICD-10-CM | POA: Insufficient documentation

## 2020-04-22 MED ORDER — LEVOCETIRIZINE DIHYDROCHLORIDE 5 MG PO TABS: 5.0000 mg | ORAL_TABLET | Freq: Every day | ORAL | 0 refills | Status: DC

## 2020-04-22 MED ORDER — PREDNISONE 20 MG PO TABS
40.0000 mg | ORAL_TABLET | Freq: Every day | ORAL | 0 refills | Status: AC
Start: 2020-04-22 — End: 2020-04-27

## 2020-04-22 MED ORDER — OLOPATADINE HCL 0.1 % OP SOLN: 1.0000 [drp] | Freq: Two times a day (BID) | OPHTHALMIC | 0 refills | Status: DC

## 2020-04-22 NOTE — ED Triage Notes (Signed)
Pt presents with complaints of headache, earache, eye drainage, and body aches x 2 days.

## 2020-04-22 NOTE — Discharge Instructions (Signed)
Your COVID 19 results will be available in 24-48 hours. Negative results are immediately resulted to Mychart. Positive results will receive a follow-up call from our clinic. If symptoms are present, I recommend home quarantine until results are known.  ?

## 2020-04-22 NOTE — ED Provider Notes (Signed)
Anne Reese    CSN: [id number] covid  Arrival date & time: 04/22/20  1827      History   Chief Complaint Chief Complaint  Patient presents with  . Headache  . Eye Problem    HPI Anne Reese is a 44 y.o. female.   HPI  COVID symptoms after a close exposure to COVID -19. Current symptoms include: HA, ear pain, eye drainage with irritation and generalized body aches. Afebrile.  Denies shortness of breath, chest pain, or new weakness.  Patient endorses some blurring of her vision with the eye drainage and irritation.   Past Medical History:  Diagnosis Date  . Asthma    no inhaler use x 15 yrs  . Complication of anesthesia    asthma attack in PACU  . Depression   . Diverticulitis   . Fibroids 2013  . Hyperlipidemia     Patient Active Problem List   Diagnosis Date Noted  . Fecal incontinence 03/17/2020  . Diarrhea 03/17/2020  . Status post surgery 01/19/2020  . Ruptured cyst of left ovary 01/19/2020  . Hemorrhagic cyst of left ovary 01/18/2020  . 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 ABDOMINAL HYSTERECTOMY N/A 06/24/2013   Procedure: HYSTERECTOMY ABDOMINAL;  Surgeon: Betsy Coder, MD;  Location: [facility];  Service: Gynecology;  Laterality: N/A;  . BILATERAL SALPINGECTOMY Bilateral 06/24/2013   Procedure: BILATERAL SALPINGECTOMY;  Surgeon: Betsy Coder, MD;  Location: [facility];  Service: Gynecology;  Laterality: Bilateral;  . CYSTOSCOPY N/A 06/24/2013   Procedure: CYSTOSCOPY;  Surgeon: Betsy Coder, MD;  Location: [facility];  Service: Gynecology;  Laterality: N/A;  . DILATION AND CURETTAGE OF UTERUS N/A 06/24/2013   Procedure: DILATATION AND CURETTAGE;  Surgeon: Betsy Coder, MD;  Location: [facility];  Service: Gynecology;  Laterality: N/A;  . GANGLION CYST EXCISION Left   . LAPAROSCOPY N/A 01/18/2020   Procedure: LAPAROSCOPY DIAGNOSTIC, WITH EVACTION OF HEMOPERITONEUM;   Surgeon: Sloan Leiter, MD;  Location: [facility];  Service: Gynecology;  Laterality: N/A;  . TUBAL LIGATION      OB History   Gravida: 3 Living: 3        Home Medications    Prior to Admission medications   Medication Sig Start Date End Date Taking? Authorizing Provider  amoxicillin-clavulanate (AUGMENTIN) 875-125 MG tablet Take 1 tablet by mouth every 12 (twelve) hours. 04/16/20   Volney American, PA-C  atorvastatin (LIPITOR) 40 MG tablet Take 1 tablet (40 mg total) by mouth daily. 12/22/19   Gildardo Pounds, NP  escitalopram (LEXAPRO) 10 MG tablet Take 1 tablet (10 mg total) by mouth daily. 03/17/20 04/16/20  Gildardo Pounds, NP  famotidine (PEPCID) 20 MG tablet Take 1 tablet (20 mg total) by mouth 2 (two) times daily. 03/17/20   Noralyn Pick, NP  ibuprofen (ADVIL) 800 MG tablet Take 1 tablet (800 mg total) by mouth every 8 (eight) hours as needed. 03/17/20   Gildardo Pounds, NP  lidocaine (XYLOCAINE) 2 % solution Use as directed 5 mLs in the mouth or throat as needed for mouth pain. 04/16/20   Volney American, PA-C  nicotine (NICODERM CQ - DOSED IN MG/24 HOURS) 14 mg/24hr patch Place 1 patch (14 mg total) onto the skin daily for 28 days. Patient not taking: Reported on 03/17/2020 04/29/20 05/27/20  Gildardo Pounds, NP  nicotine (NICODERM CQ - DOSED IN MG/24 HOURS) 21  mg/24hr patch Place 1 patch (21 mg total) onto the skin daily. 03/17/20 04/28/20  Gildardo Pounds, NP  nicotine (NICODERM CQ - DOSED IN MG/24 HR) 7 mg/24hr patch Place 1 patch (7 mg total) onto the skin daily. Patient not taking: Reported on 03/17/2020 05/28/20   Gildardo Pounds, NP  Vitamin D, Ergocalciferol, (DRISDOL) 1.25 MG (50000 UNIT) CAPS capsule Take 1 capsule (50,000 Units total) by mouth every 7 (seven) days. 12/22/19   Gildardo Pounds, NP    Family History Family History  Problem Relation Age of Onset  . Hypertension Maternal Grandfather   . Colon cancer Maternal Grandfather   . Prostate cancer  Maternal Grandfather   . Hypertension Mother   . Sickle cell anemia Maternal Grandmother   . Hypertension Maternal Grandmother   . Heart disease Maternal Grandmother   . Breast cancer Other        maternal great aunt  . Brain cancer Other        "                "       "  . HIV Brother   . Lupus Daughter     Social History Social History   Tobacco Use  . Smoking status: Current Every Day Smoker    Packs/day: 1.00    Types: Cigarettes  . Smokeless tobacco: Never Used  Vaping Use  . Vaping Use: Never used  Substance Use Topics  . Alcohol use: Yes    Comment: occasional  . Drug use: No     Allergies   Patient has no known allergies.   Review of Systems Review of Systems Pertinent negatives listed in HPI  Physical Exam Triage Vital Signs ED Triage Vitals  Enc Vitals Group     BP 04/22/20 2023 (Abnormal) 147/91     Pulse Rate 04/22/20 2023 70     Resp 04/22/20 2023 19     Temp 04/22/20 2023 98.5 F (36.9 C)     Temp src --      SpO2 04/22/20 2023 98 %     Weight --      Height --      Head Circumference --      Peak Flow --      Pain Score 04/22/20 2021 7     Pain Loc --      Pain Edu? --      Excl. in [place]? --    No data found.  Updated Vital Signs Blood Pressure (Abnormal) 147/91   Pulse 70   Temperature 98.5 F (36.9 C)   Respiration 19   Last Menstrual Period 05/19/2013 Comment: Negative HCG in ED today  Oxygen Saturation 98%   Visual Acuity Right Eye Distance:   Left Eye Distance:   Bilateral Distance:    Right Eye Near:   Left Eye Near:    Bilateral Near:     Physical Exam General appearance: Ill appearing, non-toxic, cooperative, without distress Head: Normocephalic, without obvious abnormality, atraumatic Eyes: conjunctiva mildly erythematous, bilateral eye puffiness, clear eye drainage  ENT:B/l MEE,  nasal congestion present, oropharynx normal  Respiratory: Respirations even and unlabored, normal respiratory rate Heart: rate and  rhythm normal. No gallop or murmurs noted on exam  Abdomen: BS +, no disten tion, no rebound tenderness, or no mass Extremities: No gross deformities Skin: Skin color, texture, turgor normal. No rashes seen  Psych: Appropriate mood and affect. Neurologic: GCS 15,  Normal gait, no  focal deficits present    UC Treatments / Results  Labs (all labs ordered are listed, but only abnormal results are displayed) Labs Reviewed  SARS CORONAVIRUS 2 (TAT 6-24 HRS)    EKG   Radiology No results found.  Procedures Procedures (including critical care time)  Medications Ordered in UC Medications - No data to display  Initial Impression / Assessment and Plan / UC Course  I have reviewed the triage vital signs and the nursing notes.  Pertinent labs & imaging results that were available during my care of the patient were reviewed by me and considered in my medical decision making (see chart for details).     COVID 19 results will be available in 24-48 hours, quarantine recommended until results are known. Treatment per medication orders.An After Visit Summary was printed and given to the patient/family. Precautions discussed. Red flags discussed, warranting emergent follow-up at ER.Questions invited and answered. They voiced understanding and agreement.    Final Clinical Impressions(s) / UC Diagnoses   Final diagnoses:  Suspected COVID-19 virus infection  Allergic conjunctivitis of both eyes  Eustachian tube dysfunction, right     Discharge Instructions     Your COVID 19 results will be available in 24-48 hours. Negative results are immediately resulted to Mychart. Positive results will receive a follow-up call from our clinic. If symptoms are present, I recommend home quarantine until results are known.     ED Prescriptions    Medication Sig Dispense Auth. Provider   predniSONE (DELTASONE) 20 MG tablet Take 2 tablets (40 mg total) by mouth daily with breakfast for 5 days. 10  tablet Scot Jun, FNP   levocetirizine (XYZAL) 5 MG tablet Take 1 tablet (5 mg total) by mouth at bedtime. 30 tablet Scot Jun, FNP   olopatadine (PATADAY) 0.1 % ophthalmic solution Place 1 drop into both eyes 2 (two) times daily. 5 mL Scot Jun, FNP     PDMP not reviewed this encounter.   Scot Jun, FNP 04/27/20 1549

## 2020-04-23 LAB — SARS CORONAVIRUS 2 (TAT 6-24 HRS): SARS Coronavirus 2: NEGATIVE

## 2020-04-23 MED FILL — LEVOCETIRIZINE 5 MG TABLET: 5 | 30 days supply | Qty: 30 | Fill #0

## 2020-04-23 MED FILL — predniSONE 20 MG TABS: 20 | 5 days supply | Qty: 10 | Fill #0

## 2020-04-27 ENCOUNTER — Ambulatory Visit: Payer: 59 | Admitting: Nurse Practitioner

## 2020-04-30 NOTE — Telephone Encounter (Signed)
Please advise.   Copied from [place] [id number]. Topic: General - Other >> Apr 30, 2020 10:57 AM Yvette Rack wrote: Reason for CRM: Terri with Sleep Disorder stated pt insurance denied request for sleep study. Denial letter will be faxed

## 2020-05-07 ENCOUNTER — Other Ambulatory Visit: Payer: Self-pay

## 2020-05-07 ENCOUNTER — Encounter: Payer: Self-pay | Admitting: Physical Therapy

## 2020-05-07 ENCOUNTER — Ambulatory Visit: Payer: 59 | Attending: Nurse Practitioner | Admitting: Physical Therapy

## 2020-05-07 DIAGNOSIS — M6281 Muscle weakness (generalized): Secondary | ICD-10-CM | POA: Insufficient documentation

## 2020-05-07 DIAGNOSIS — R279 Unspecified lack of coordination: Secondary | ICD-10-CM | POA: Diagnosis not present

## 2020-05-07 NOTE — Therapy (Signed)
[facility] [address] [place], [place], [address] Phone: [phone]   Fax:  [phone]  Physical Therapy Evaluation  Patient Details  Name: Anne Reese MRN: [id number] Date of Birth: 04-30-1976 Referring Provider (PT): Noralyn Pick, NP   Encounter Date: 05/07/2020   PT End of Session - 05/07/20 3716    Visit Number 1    Date for PT Re-Evaluation 07/30/20    PT Start Time 0850    PT Stop Time 0931    PT Time Calculation (min) 41 min    Activity Tolerance Patient tolerated treatment well    Behavior During Therapy [facility] for tasks assessed/performed           Past Medical History:  Diagnosis Date   Asthma    no inhaler use x 15 yrs   Complication of anesthesia    asthma attack in PACU   Depression    Diverticulitis    Fibroids 2013   Hyperlipidemia     Past Surgical History:  Procedure Laterality Date   ABDOMINAL HYSTERECTOMY N/A 06/24/2013   Procedure: HYSTERECTOMY ABDOMINAL;  Surgeon: Betsy Coder, MD;  Location: [facility];  Service: Gynecology;  Laterality: N/A;   BILATERAL SALPINGECTOMY Bilateral 06/24/2013   Procedure: BILATERAL SALPINGECTOMY;  Surgeon: Betsy Coder, MD;  Location: [facility];  Service: Gynecology;  Laterality: Bilateral;   CYSTOSCOPY N/A 06/24/2013   Procedure: CYSTOSCOPY;  Surgeon: Betsy Coder, MD;  Location: [facility];  Service: Gynecology;  Laterality: N/A;   DILATION AND CURETTAGE OF UTERUS N/A 06/24/2013   Procedure: DILATATION AND CURETTAGE;  Surgeon: Betsy Coder, MD;  Location: [facility];  Service: Gynecology;  Laterality: N/A;   GANGLION CYST EXCISION Left    LAPAROSCOPY N/A 01/18/2020   Procedure: LAPAROSCOPY DIAGNOSTIC, WITH EVACTION OF HEMOPERITONEUM;  Surgeon: Sloan Leiter, MD;  Location: [facility];  Service: Gynecology;  Laterality: N/A;   TUBAL LIGATION      There were no vitals filed for this visit.    Subjective Assessment - 05/07/20  0858    Subjective Pt states had leakage for over a year.  She has been sleeping with depends and wears to go out for long day.  Urinary leakage occurs a little bit throughout the whole day.  I only drink about 8 oz/day.  BM are very irregular and can go 3 weeks, but recently since may-June after surgery I can't stop going.  It is small pieces and going 5/6x/day.    Currently in Pain? 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)    Pain Relieving Factors Tylenol, ibuprofen    Effect of Pain on Daily Activities just hurts    Multiple Pain Sites No              [facility] PT Assessment - 05/07/20 0001      Assessment   Medical Diagnosis R15.9 (ICD-10-CM) - Incontinence of feces, unspecified fecal incontinence type; R19.7 (ICD-10-CM) - Diarrhea, unspecified type  Referring Provider (PT) Noralyn Pick, NP    Onset Date/Surgical Date --   1-2 years   Prior Therapy No      Precautions   Precautions None      Restrictions   Weight Bearing Restrictions No      Balance Screen   Has the patient fallen in the past 6 months No      [place] residence    Living Arrangements Spouse/significant other      Prior Function   Level of Independence Independent    Vocation Full time employment      Cognition   Overall Cognitive Status Within Functional Limits for tasks assessed      Posture/Postural Control   Posture/Postural Control Postural limitations    Postural Limitations Decreased lumbar lordosis;Anterior pelvic tilt      ROM / Strength   AROM / PROM /  Strength PROM;Strength      PROM   Overall PROM Comments IR 20% bilateral +pain      Strength   Overall Strength Comments hip ER/IR 4/5      Flexibility   Soft Tissue Assessment /Muscle Length yes    Hamstrings normal      Palpation   Palpation comment abdominal wall low tone; lumbar erectors tight      Ambulation/Gait   Gait Pattern Within Functional Limits                      Objective measurements completed on examination: See above findings.     Pelvic Floor Special Questions - 05/07/20 0001    Prior Pelvic/Prostate Exam Yes    Are you Pregnant or attempting pregnancy? No   hysterectomy   Prior Pregnancies Yes    Number of Pregnancies 3   one set twins   Number of Vaginal Deliveries 4    Any difficulty with labor and deliveries Yes    Episiotomy Performed --   last child   Urinary Leakage Yes    How often 5-6/day    Pad use daily 2/3    Activities that cause leaking With strong urge;Coughing    Urinary urgency Yes    Fecal incontinence Yes    Falling out feeling (prolapse) Yes    Activities that cause feeling of prolapse full bladder    Skin Integrity Intact    Perineal Body/Introitus  Descended    Pelvic Floor Internal Exam pt identity confirmed and informed consent given to perform internal assessment    Exam Type Vaginal    Palpation tight levators Lt>Rt    Strength weak squeeze, no lift    Strength # of reps 1    Strength # of seconds 4    Tone high            [facility] Adult PT Treatment/Exercise - 05/07/20 0001      Self-Care   Self-Care Other Self-Care Comments    Other Self-Care Comments  toileting techniques                       PT Long Term Goals - 05/07/20 0958      PT LONG TERM GOAL #1   Title Pt will be ind with final HEP for maintenance of bowel/bladder control    Time 12    Period Weeks    Status New    Target Date 07/30/20      PT LONG TERM GOAL #2  Title Pt will report 80% less abdominal pain when  working    Time 12    Period Weeks    Status New    Target Date 07/30/20      PT LONG TERM GOAL #3   Title Pt will have more normalized stool size and consistency of 3-5 on bristol stool scale and diameter of a quarter.    Time 12    Period Weeks    Status New    Target Date 07/30/20      PT LONG TERM GOAL #4   Title Pt will have leakage reduced to <1x/day    Baseline 5-6/day    Time 12    Period Weeks    Status New    Target Date 07/30/20      PT LONG TERM GOAL #5   Title Pt will have 50% less urinary/fecal urgency in order to make it to the bathroom more easily when not at home    Time 12    Period Weeks    Status New    Target Date 07/30/20                  Plan - 05/07/20 3419    Clinical Impression Statement Pt presents to skilled PT due to incontinence that has been ongoing for over a year.  Pt has decreased hip ROM with no IR bilat and pain.  Pt has high pelvic floor muscle tone most notably in the levators.  Pt has a hard time maintaining a pelvic floor contraction for more than 4 seconds and is only a weak squeeze.  Pt has hip weakness as noted above.  Very TTP from connection of transverse to descending colon and down the descending colon.  Pt only able to do one quick flick due to holding tension after one rep.  Pt will benefit from skilled PT to address impairments and muscle coordination for improved bowel and bladder control and pain management.    Personal Factors and Comorbidities Comorbidity 3+;Time since onset of injury/illness/exacerbation    Comorbidities 4 vaginal deliveries; hysterectomy, mulitple abdominal surgeries    Examination-Activity Limitations Toileting;Continence    Stability/Clinical Decision Making Evolving/Moderate complexity    Clinical Decision Making Moderate    Rehab Potential Excellent    PT Frequency 1x / week    PT Duration 12 weeks    PT Treatment/Interventions ADLs/Self Care Home Management;Biofeedback;Cryotherapy;Electrical  Stimulation;Moist Heat;Neuromuscular re-education;Therapeutic exercise;Therapeutic activities;Patient/family education;Taping;Manual techniques;Passive range of motion    PT Next Visit Plan hip stretches; review toileting techniques, urge to void, abdominal fascial release    PT Home Exercise Plan toileting techniques    Consulted and Agree with Plan of Care Patient           Patient will benefit from skilled therapeutic intervention in order to improve the following deficits and impairments:  Pain, Hypomobility, Decreased strength, Decreased coordination, Decreased range of motion, Impaired tone, Increased muscle spasms  Visit Diagnosis: Unspecified lack of coordination  Muscle weakness (generalized)     Problem List Patient Active Problem List   Diagnosis Date Noted   Fecal incontinence 03/17/2020   Diarrhea 03/17/2020   Status post surgery 01/19/2020   Ruptured cyst of left ovary 01/19/2020   Hemorrhagic cyst of left ovary 01/18/2020   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, [place], [place], [address] Phone: [phone]  Fax:  [phone]  Name: Anne Reese MRN: [id number] Date of Birth: [birth year]

## 2020-05-07 NOTE — Telephone Encounter (Signed)
Fax was received

## 2020-05-07 NOTE — Patient Instructions (Addendum)
Toileting Techniques for Bowel Movements (Defecation) Using your belly (abdomen) and pelvic floor muscles to have a bowel movement is usually instinctive.  Sometimes people can have problems with these muscles and have to relearn proper defecation (emptying) techniques.  If you have weakness in your muscles, organs that are falling out, decreased sensation in your pelvis, or ignore your urge to go, you may find yourself straining to have a bowel movement.  You are straining if you are: . holding your breath or taking in a huge gulp of air and holding it  . keeping your lips and jaw tensed and closed tightly . turning red in the face because of excessive pushing or forcing . developing or worsening your  hemorrhoids . getting faint while pushing . not emptying completely and have to defecate many times a day  If you are straining, you are actually making it harder for yourself to have a bowel movement.  Many people find they are pulling up with the pelvic floor muscles and closing off instead of opening the anus. Due to lack pelvic floor relaxation and coordination the abdominal muscles, one has to work harder to push the feces out.  Many people have never been taught how to defecate efficiently and effectively.  Notice what happens to your body when you are having a bowel movement.  While you are sitting on the toilet pay attention to the following areas: . Jaw and mouth position . Angle of your hips   . Whether your feet touch the ground or not . Arm placement  . Spine position . Waist . Belly tension . Anus (opening of the anal canal)  An Evacuation/Defecation Plan   Here are the 4 basic points:  1. Lean forward enough for your elbows to rest on your knees 2. Support your feet on the floor or use a low stool if your feet don't touch the floor  3. Push out your belly as if you have swallowed a beach ball-you should feel a widening of your waist 4. Open and relax your pelvic floor muscles,  rather than tightening around the anus      The following conditions my require modifications to your toileting posture:  . If you have had surgery in the past that limits your back, hip, pelvic, knee or ankle flexibility . Constipation   Your healthcare practitioner may make the following additional suggestions and adjustments:  1) Sit on the toilet  a) Make sure your feet are supported. b) Notice your hip angle and spine position-most people find it effective to lean forward or raise their knees, which can help the muscles around the anus to relax  c) When you lean forward, place your forearms on your thighs for support  2) Relax suggestions a) Breath deeply in through your nose and out slowly through your mouth as if you are smelling the flowers and blowing out the candles. b) To become aware of how to relax your muscles, contracting and releasing muscles can be helpful.  Pull your pelvic floor muscles in tightly by using the image of holding back gas, or closing around the anus (visualize making a circle smaller) and lifting the anus up and in.  Then release the muscles and your anus should drop down and feel open. Repeat 5 times ending with the feeling of relaxation. c) Keep your pelvic floor muscles relaxed; let your belly bulge out. d) The digestive tract starts at the mouth and ends at the anal opening, so be   sure to relax both ends of the tube.  Place your tongue on the roof of your mouth with your teeth separated.  This helps relax your mouth and will help to relax the anus at the same time.  3) Empty (defecation) a) Keep your pelvic floor and sphincter relaxed, then bulge your anal muscles.  Make the anal opening wide.  b) Stick your belly out as if you have swallowed a beach ball. c) Make your belly wall hard using your belly muscles while continuing to breathe. Doing this makes it easier to open your anus. d) Breath out and give a grunt (or try using other sounds such as  ahhhh, shhhhh, ohhhh or grrrrrrr).  4) Finish a) As you finish your bowel movement, pull the pelvic floor muscles up and in.  This will leave your anus in the proper place rather than remaining pushed out and down. If you leave your anus pushed out and down, it will start to feel as though that is normal and give you incorrect signals about needing to have a bowel movement.   [facility] [address] [place], [place] [address]  

## 2020-05-14 ENCOUNTER — Ambulatory Visit: Payer: 59 | Admitting: Physical Therapy

## 2020-05-18 ENCOUNTER — Ambulatory Visit: Payer: 59 | Admitting: Gastroenterology

## 2020-05-20 ENCOUNTER — Other Ambulatory Visit: Payer: Self-pay | Admitting: Nurse Practitioner

## 2020-05-20 ENCOUNTER — Telehealth: Payer: Self-pay

## 2020-05-20 DIAGNOSIS — K089 Disorder of teeth and supporting structures, unspecified: Secondary | ICD-10-CM

## 2020-05-20 MED ORDER — IBUPROFEN 800 MG PO TABS: 800.0000 mg | ORAL_TABLET | Freq: Three times a day (TID) | ORAL | 1 refills | Status: DC | PRN

## 2020-05-20 NOTE — Telephone Encounter (Signed)
Copied from [place] [id number]. Topic: General - Other >> May 20, 2020  4:54 PM Mcneil, Ja-Kwan wrote: Reason for CRM: Pt stated she has plantar fasciitis and has to stand up at work for 7.5 hours a day. Pt requests a doctor's note for a chair. Cb# [phone]

## 2020-05-20 NOTE — Telephone Encounter (Signed)
Copied from [place] [id number]. Topic: Quick Communication - Rx Refill/Question >> May 20, 2020  4:50 PM Mcneil, Ja-Kwan wrote: Medication: ibuprofen (ADVIL) 800 MG tablet and traMADol (ULTRAM) 50 MG tablet  Has the patient contacted their pharmacy? no  Preferred Pharmacy (with phone number or street name): [facility], [address] Phone: [phone]   Fax: [phone]  Agent: Please be advised that RX refills may take up to 3 business days. We ask that you follow-up with your pharmacy.

## 2020-05-21 ENCOUNTER — Encounter: Payer: 59 | Admitting: Physical Therapy

## 2020-05-22 ENCOUNTER — Other Ambulatory Visit: Payer: Self-pay

## 2020-05-22 ENCOUNTER — Ambulatory Visit
Admission: EM | Admit: 2020-05-22 | Discharge: 2020-05-22 | Disposition: A | Discharge status: Home / Self Care | Payer: 59 | Attending: Emergency Medicine | Admitting: Emergency Medicine

## 2020-05-22 ENCOUNTER — Encounter: Payer: Self-pay | Admitting: Emergency Medicine

## 2020-05-22 DIAGNOSIS — K029 Dental caries, unspecified: Secondary | ICD-10-CM

## 2020-05-22 DIAGNOSIS — T85848A Pain due to other internal prosthetic devices, implants and grafts, initial encounter: Secondary | ICD-10-CM

## 2020-05-22 MED ORDER — MELOXICAM 15 MG PO TABS: 15.0000 mg | ORAL_TABLET | Freq: Every day | ORAL | 0 refills | Status: DC

## 2020-05-22 MED ORDER — AMOXICILLIN-POT CLAVULANATE 875-125 MG PO TABS: 1.0000 | ORAL_TABLET | Freq: Two times a day (BID) | ORAL | 0 refills | Status: DC

## 2020-05-22 MED ORDER — KETOROLAC TROMETHAMINE 30 MG/ML IJ SOLN
30.0000 mg | Freq: Once | INTRAMUSCULAR | Status: AC
  Administered 2020-05-22: 30 mg via INTRAMUSCULAR

## 2020-05-22 NOTE — ED Provider Notes (Signed)
[facility]    CSN: [id number] Arrival date & time: 05/22/20  1312      History   Chief Complaint Chief Complaint  Patient presents with  . Dental Pain    HPI Anne Reese is a 44 y.o. female  Presenting for right lower dental pain for the last few days.  States that this happened a week ago, though went away on its own.  Has been taking ibuprofen without relief.  States that she has a Pharmacist, community appointment pending as she just recently got dental insurance.  Denies throat pain, fever, palpitations or chest pain or ear pain.  Past Medical History:  Diagnosis Date  . Asthma    no inhaler use x 15 yrs  . Complication of anesthesia    asthma attack in PACU  . Depression   . Diverticulitis   . Fibroids 2013  . Hyperlipidemia     Patient Active Problem List   Diagnosis Date Noted  . Fecal incontinence 03/17/2020  . Diarrhea 03/17/2020  . Status post surgery 01/19/2020  . Ruptured cyst of left ovary 01/19/2020  . Hemorrhagic cyst of left ovary 01/18/2020  . 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 ABDOMINAL HYSTERECTOMY N/A 06/24/2013   Procedure: HYSTERECTOMY ABDOMINAL;  Surgeon: Betsy Coder, MD;  Location: [facility];  Service: Gynecology;  Laterality: N/A;  . BILATERAL SALPINGECTOMY Bilateral 06/24/2013   Procedure: BILATERAL SALPINGECTOMY;  Surgeon: Betsy Coder, MD;  Location: [facility];  Service: Gynecology;  Laterality: Bilateral;  . CYSTOSCOPY N/A 06/24/2013   Procedure: CYSTOSCOPY;  Surgeon: Betsy Coder, MD;  Location: [facility];  Service: Gynecology;  Laterality: N/A;  . DILATION AND CURETTAGE OF UTERUS N/A 06/24/2013   Procedure: DILATATION AND CURETTAGE;  Surgeon: Betsy Coder, MD;  Location: [facility];  Service: Gynecology;  Laterality: N/A;  . GANGLION CYST EXCISION Left   . LAPAROSCOPY N/A 01/18/2020   Procedure: LAPAROSCOPY DIAGNOSTIC, WITH EVACTION OF  HEMOPERITONEUM;  Surgeon: Sloan Leiter, MD;  Location: [facility];  Service: Gynecology;  Laterality: N/A;  . TUBAL LIGATION      OB History    Gravida  3   Para      Term      Preterm      AB      Living  3     SAB      TAB      Ectopic      Multiple      Live Births               Home Medications    Prior to Admission medications   Medication Sig Start Date End Date Taking? Authorizing Provider  amoxicillin-clavulanate (AUGMENTIN) 875-125 MG tablet Take 1 tablet by mouth every 12 (twelve) hours. 05/22/20   Hall-Potvin, [place], PA-C  atorvastatin (LIPITOR) 40 MG tablet Take 1 tablet (40 mg total) by mouth daily. 12/22/19   Gildardo Pounds, NP  escitalopram (LEXAPRO) 10 MG tablet Take 1 tablet (10 mg total) by mouth daily. 03/17/20 04/16/20  Gildardo Pounds, NP  famotidine (PEPCID) 20 MG tablet Take 1 tablet (20 mg total) by mouth 2 (two) times daily. 03/17/20   Noralyn Pick, NP  ibuprofen (ADVIL) 800 MG tablet Take 1 tablet (800 mg total) by mouth every 8 (eight) hours as needed. 05/20/20   Gildardo Pounds, NP  levocetirizine (XYZAL) 5 MG tablet Take  1 tablet (5 mg total) by mouth at bedtime. 04/22/20   Scot Jun, FNP  lidocaine (XYLOCAINE) 2 % solution Use as directed 5 mLs in the mouth or throat as needed for mouth pain. 04/16/20   Volney American, PA-C  meloxicam (MOBIC) 15 MG tablet Take 1 tablet (15 mg total) by mouth daily. 05/22/20   Hall-Potvin, [place], PA-C  nicotine (NICODERM CQ - DOSED IN MG/24 HOURS) 14 mg/24hr patch Place 1 patch (14 mg total) onto the skin daily for 28 days. Patient not taking: Reported on 03/17/2020 04/29/20 05/27/20  Gildardo Pounds, NP  nicotine (NICODERM CQ - DOSED IN MG/24 HR) 7 mg/24hr patch Place 1 patch (7 mg total) onto the skin daily. Patient not taking: Reported on 03/17/2020 05/28/20   Gildardo Pounds, NP  olopatadine (PATADAY) 0.1 % ophthalmic solution Place 1 drop into both eyes 2 (two) times daily. 04/22/20    Scot Jun, FNP  Vitamin D, Ergocalciferol, (DRISDOL) 1.25 MG (50000 UNIT) CAPS capsule Take 1 capsule (50,000 Units total) by mouth every 7 (seven) days. 12/22/19   Gildardo Pounds, NP    Family History Family History  Problem Relation Age of Onset  . Hypertension Maternal Grandfather   . Colon cancer Maternal Grandfather   . Prostate cancer Maternal Grandfather   . Hypertension Mother   . Sickle cell anemia Maternal Grandmother   . Hypertension Maternal Grandmother   . Heart disease Maternal Grandmother   . Breast cancer Other        maternal great aunt  . Brain cancer Other        "                "       "  . HIV Brother   . Lupus Daughter     Social History Social History   Tobacco Use  . Smoking status: Current Every Day Smoker    Packs/day: 1.00    Types: Cigarettes  . Smokeless tobacco: Never Used  Vaping Use  . Vaping Use: Never used  Substance Use Topics  . Alcohol use: Yes    Comment: occasional  . Drug use: No     Allergies   Patient has no known allergies.   Review of Systems As per HPI   Physical Exam Triage Vital Signs ED Triage Vitals  Enc Vitals Group     BP      Pulse      Resp      Temp      Temp src      SpO2      Weight      Height      Head Circumference      Peak Flow      Pain Score      Pain Loc      Pain Edu?      Excl. in [place]?    No data found.  Updated Vital Signs BP (!) 177/107 (BP Location: Right Arm)   Pulse 94   Temp 98.6 F (37 C) (Oral)   Resp 18   LMP 05/19/2013 Comment: Negative HCG in ED today  SpO2 97%   Visual Acuity Right Eye Distance:   Left Eye Distance:   Bilateral Distance:    Right Eye Near:   Left Eye Near:    Bilateral Near:     Physical Exam Constitutional:      General: She is not in acute distress. HENT:  Head: Normocephalic and atraumatic.     Mouth/Throat:     Comments: Tonsillar hypertrophy, erythema or exudate. Poor dentition.  Right lower front molar with  significant cavitation.  No foreign body.  Mild gingival erythema without fluctuance, discharge Eyes:     General: No scleral icterus.    Pupils: Pupils are equal, round, and reactive to light.  Cardiovascular:     Rate and Rhythm: Normal rate.  Pulmonary:     Effort: Pulmonary effort is normal.  Skin:    Coloration: Skin is not jaundiced or pale.  Neurological:     Mental Status: She is alert and oriented to person, place, and time.      UC Treatments / Results  Labs (all labs ordered are listed, but only abnormal results are displayed) Labs Reviewed - No data to display  EKG   Radiology No results found.  Procedures Procedures (including critical care time)  Medications Ordered in UC Medications  ketorolac (TORADOL) 30 MG/ML injection 30 mg (30 mg Intramuscular Given 05/22/20 1359)    Initial Impression / Assessment and Plan / UC Course  I have reviewed the triage vital signs and the nursing notes.  Pertinent labs & imaging results that were available during my care of the patient were reviewed by me and considered in my medical decision making (see chart for details).     Likely infected dental cavity.  Provided low cost community dental resources.  Will start Augmentin, Mobic in the interim.  Requesting Toradol shot: Given.  Return precautions discussed, pt verbalized understanding and is agreeable to plan. Final Clinical Impressions(s) / UC Diagnoses   Final diagnoses:  Dental implant pain, initial encounter  Dental cavity     Discharge Instructions     Low-Cost Community Dental Resources:  [facility] Address: [address] [place], [place], [address] Phone: [phone]  - Dr. Donn Pierini Address: [address] [place], [place], [address] Phone: [phone]    ED Prescriptions    Medication Sig Dispense Auth. Provider   amoxicillin-clavulanate (AUGMENTIN) 875-125 MG tablet Take 1 tablet by mouth every 12 (twelve)  hours. 14 tablet Hall-Potvin, [place], PA-C   meloxicam (MOBIC) 15 MG tablet Take 1 tablet (15 mg total) by mouth daily. 15 tablet Hall-Potvin, [place], PA-C     I have reviewed the PDMP during this encounter.   Hall-Potvin, [place], [place] 05/22/20 1411

## 2020-05-22 NOTE — ED Triage Notes (Signed)
Pt sts right sided dental pain; pt sts got antibiotics and ibuprofen yesterday but not helping pain; pt sts has appointment to pull tooth on Monday

## 2020-05-22 NOTE — Discharge Instructions (Addendum)
Low-Cost Community Dental Resources:  [facility] Address: [address], [place], [place], [address] Phone: [phone]  - Dr. Civils Address: [address], [place], [place], [address] Phone: [phone] 

## 2020-05-28 ENCOUNTER — Telehealth: Payer: Self-pay | Admitting: Nurse Practitioner

## 2020-05-28 NOTE — Telephone Encounter (Signed)
Pt stated she has plantar fasciitis and has to stand up at work for 7.5 hours a day. Pt requests a doctor's note for a chair. Will route to PCP.

## 2020-05-28 NOTE — Telephone Encounter (Signed)
Patient came in requesting an update on the letter she is requesting for her job. Please f/u

## 2020-05-30 NOTE — Telephone Encounter (Signed)
Does her job allow her to sit at work? What type of work does she do?

## 2020-06-01 ENCOUNTER — Encounter: Payer: 59 | Admitting: Physical Therapy

## 2020-06-04 NOTE — Telephone Encounter (Signed)
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 Pt. Stated they she need a chair for work and they stated she need a doctor note for a chair to allow her to sit down for her job. She stated her job require for her to stand up but if she have a note for a chair due to her Plantar Fasciitis, they will provide her a chair.

## 2020-06-06 ENCOUNTER — Encounter: Payer: Self-pay | Admitting: Nurse Practitioner

## 2020-06-06 NOTE — Telephone Encounter (Signed)
WORK NOTE IN CHART

## 2020-06-10 NOTE — Telephone Encounter (Signed)
Spoke to patient. Pt. Stated she was able to receive it through [facility].

## 2020-06-24 ENCOUNTER — Ambulatory Visit: Payer: 59 | Attending: Nurse Practitioner | Admitting: Physical Therapy

## 2020-06-24 ENCOUNTER — Telehealth: Payer: Self-pay | Admitting: Physical Therapy

## 2020-06-24 NOTE — Telephone Encounter (Signed)
Pt was called due to No show.  No voice mail set up  [employer], PT 06/24/20 3:55 PM

## 2020-06-28 ENCOUNTER — Ambulatory Visit: Payer: 59 | Admitting: Physical Therapy

## 2020-07-06 ENCOUNTER — Other Ambulatory Visit: Payer: Self-pay | Admitting: Nurse Practitioner

## 2020-07-06 ENCOUNTER — Other Ambulatory Visit: Payer: Self-pay

## 2020-07-06 ENCOUNTER — Telehealth: Payer: Self-pay | Admitting: Nurse Practitioner

## 2020-07-06 ENCOUNTER — Ambulatory Visit (INDEPENDENT_AMBULATORY_CARE_PROVIDER_SITE_OTHER): Payer: 59

## 2020-07-06 ENCOUNTER — Encounter: Payer: Self-pay | Admitting: Podiatry

## 2020-07-06 ENCOUNTER — Ambulatory Visit (INDEPENDENT_AMBULATORY_CARE_PROVIDER_SITE_OTHER): Payer: 59 | Admitting: Podiatry

## 2020-07-06 DIAGNOSIS — M722 Plantar fascial fibromatosis: Secondary | ICD-10-CM | POA: Diagnosis not present

## 2020-07-06 DIAGNOSIS — M79672 Pain in left foot: Secondary | ICD-10-CM

## 2020-07-06 DIAGNOSIS — M79671 Pain in right foot: Secondary | ICD-10-CM

## 2020-07-06 DIAGNOSIS — M7732 Calcaneal spur, left foot: Secondary | ICD-10-CM | POA: Diagnosis not present

## 2020-07-06 DIAGNOSIS — M7731 Calcaneal spur, right foot: Secondary | ICD-10-CM

## 2020-07-06 DIAGNOSIS — L84 Corns and callosities: Secondary | ICD-10-CM | POA: Diagnosis not present

## 2020-07-06 DIAGNOSIS — G4733 Obstructive sleep apnea (adult) (pediatric): Secondary | ICD-10-CM

## 2020-07-06 MED ORDER — MELOXICAM 15 MG PO TABS: 15.0000 mg | ORAL_TABLET | Freq: Every day | ORAL | 3 refills | Status: DC

## 2020-07-06 MED FILL — MELOXICAM 15 MG TABLET: 15 | 30 days supply | Qty: 30 | Fill #0

## 2020-07-06 NOTE — Patient Instructions (Signed)
Look for urea 40% cream or ointment and apply to the thickened dry skin / calluses. This can be bought over the counter, at a pharmacy or online such as [employer].    Plantar Fasciitis (Heel Spur Syndrome) with Rehab The plantar fascia is a fibrous, ligament-like, soft-tissue structure that spans the bottom of the foot. Plantar fasciitis is a condition that causes pain in the foot due to inflammation of the tissue. SYMPTOMS  Pain and tenderness on the underneath side of the foot. Pain that worsens with standing or walking. CAUSES  Plantar fasciitis is caused by irritation and injury to the plantar fascia on the underneath side of the foot. Common mechanisms of injury include: Direct trauma to bottom of the foot. Damage to a small nerve that runs under the foot where the main fascia attaches to the heel bone. Stress placed on the plantar fascia due to bone spurs. RISK INCREASES WITH:  Activities that place stress on the plantar fascia (running, jumping, pivoting, or cutting). Poor strength and flexibility. Improperly fitted shoes. Tight calf muscles. Flat feet. Failure to warm-up properly before activity. Obesity. PREVENTION Warm up and stretch properly before activity. Allow for adequate recovery between workouts. Maintain physical fitness: Strength, flexibility, and endurance. Cardiovascular fitness. Maintain a health body weight. Avoid stress on the plantar fascia. Wear properly fitted shoes, including arch supports for individuals who have flat feet.  PROGNOSIS  If treated properly, then the symptoms of plantar fasciitis usually resolve without surgery. However, occasionally surgery is necessary.  RELATED COMPLICATIONS  Recurrent symptoms that may result in a chronic condition. Problems of the lower back that are caused by compensating for the injury, such as limping. Pain or weakness of the foot during push-off following surgery. Chronic inflammation, scarring, and partial or  complete fascia tear, occurring more often from repeated injections.  TREATMENT  Treatment initially involves the use of ice and medication to help reduce pain and inflammation. The use of strengthening and stretching exercises may help reduce pain with activity, especially stretches of the Achilles tendon. These exercises may be performed at home or with a therapist. Your caregiver may recommend that you use heel cups of arch supports to help reduce stress on the plantar fascia. Occasionally, corticosteroid injections are given to reduce inflammation. If symptoms persist for greater than 6 months despite non-surgical (conservative), then surgery may be recommended.   MEDICATION  If pain medication is necessary, then nonsteroidal anti-inflammatory medications, such as aspirin and ibuprofen, or other minor pain relievers, such as acetaminophen, are often recommended. Do not take pain medication within 7 days before surgery. Prescription pain relievers may be given if deemed necessary by your caregiver. Use only as directed and only as much as you need. Corticosteroid injections may be given by your caregiver. These injections should be reserved for the most serious cases, because they may only be given a certain number of times.  HEAT AND COLD Cold treatment (icing) relieves pain and reduces inflammation. Cold treatment should be applied for 10 to 15 minutes every 2 to 3 hours for inflammation and pain and immediately after any activity that aggravates your symptoms. Use ice packs or massage the area with a piece of ice (ice massage). Heat treatment may be used prior to performing the stretching and strengthening activities prescribed by your caregiver, physical therapist, or athletic trainer. Use a heat pack or soak the injury in warm water.  SEEK IMMEDIATE MEDICAL CARE IF: Treatment seems to offer no benefit, or the condition worsens.   Any medications produce adverse side  effects.  EXERCISES- RANGE OF MOTION (ROM) AND STRETCHING EXERCISES - Plantar Fasciitis (Heel Spur Syndrome) These exercises may help you when beginning to rehabilitate your injury. Your symptoms may resolve with or without further involvement from your physician, physical therapist or athletic trainer. While completing these exercises, remember:  Restoring tissue flexibility helps normal motion to return to the joints. This allows healthier, less painful movement and activity. An effective stretch should be held for at least 30 seconds. A stretch should never be painful. You should only feel a gentle lengthening or release in the stretched tissue.  RANGE OF MOTION - Toe Extension, Flexion Sit with your right / left leg crossed over your opposite knee. Grasp your toes and gently pull them back toward the top of your foot. You should feel a stretch on the bottom of your toes and/or foot. Hold this stretch for 10 seconds. Now, gently pull your toes toward the bottom of your foot. You should feel a stretch on the top of your toes and or foot. Hold this stretch for 10 seconds. Repeat  times. Complete this stretch 3 times per day.   RANGE OF MOTION - Ankle Dorsiflexion, Active Assisted Remove shoes and sit on a chair that is preferably not on a carpeted surface. Place right / left foot under knee. Extend your opposite leg for support. Keeping your heel down, slide your right / left foot back toward the chair until you feel a stretch at your ankle or calf. If you do not feel a stretch, slide your bottom forward to the edge of the chair, while still keeping your heel down. Hold this stretch for 10 seconds. Repeat 3 times. Complete this stretch 2 times per day.   STRETCH  Gastroc, Standing Place hands on wall. Extend right / left leg, keeping the front knee somewhat bent. Slightly point your toes inward on your back foot. Keeping your right / left heel on the floor and your knee straight, shift  your weight toward the wall, not allowing your back to arch. You should feel a gentle stretch in the right / left calf. Hold this position for 10 seconds. Repeat 3 times. Complete this stretch 2 times per day.  STRETCH  Soleus, Standing Place hands on wall. Extend right / left leg, keeping the other knee somewhat bent. Slightly point your toes inward on your back foot. Keep your right / left heel on the floor, bend your back knee, and slightly shift your weight over the back leg so that you feel a gentle stretch deep in your back calf. Hold this position for 10 seconds. Repeat 3 times. Complete this stretch 2 times per day.  STRETCH  Gastrocsoleus, Standing  Note: This exercise can place a lot of stress on your foot and ankle. Please complete this exercise only if specifically instructed by your caregiver.  Place the ball of your right / left foot on a step, keeping your other foot firmly on the same step. Hold on to the wall or a rail for balance. Slowly lift your other foot, allowing your body weight to press your heel down over the edge of the step. You should feel a stretch in your right / left calf. Hold this position for 10 seconds. Repeat this exercise with a slight bend in your right / left knee. Repeat 3 times. Complete this stretch 2 times per day.   STRENGTHENING EXERCISES - Plantar Fasciitis (Heel Spur Syndrome)  These exercises may   help you when beginning to rehabilitate your injury. They may resolve your symptoms with or without further involvement from your physician, physical therapist or athletic trainer. While completing these exercises, remember:  Muscles can gain both the endurance and the strength needed for everyday activities through controlled exercises. Complete these exercises as instructed by your physician, physical therapist or athletic trainer. Progress the resistance and repetitions only as guided.  STRENGTH - Towel Curls Sit in a chair positioned on a  non-carpeted surface. Place your foot on a towel, keeping your heel on the floor. Pull the towel toward your heel by only curling your toes. Keep your heel on the floor. Repeat 3 times. Complete this exercise 2 times per day.  STRENGTH - Ankle Inversion Secure one end of a rubber exercise band/tubing to a fixed object (table, pole). Loop the other end around your foot just before your toes. Place your fists between your knees. This will focus your strengthening at your ankle. Slowly, pull your big toe up and in, making sure the band/tubing is positioned to resist the entire motion. Hold this position for 10 seconds. Have your muscles resist the band/tubing as it slowly pulls your foot back to the starting position. Repeat 3 times. Complete this exercises 2 times per day.  Document Released: 07/31/2005 Document Revised: 10/23/2011 Document Reviewed: 11/12/2008 [employer] Patient Information 2014 [employer].  

## 2020-07-06 NOTE — Telephone Encounter (Signed)
Pt inquiring for clarification about a sleep study and the date of when this would be to prepare. Pt is getting a "split sleep study on My Chart"  And it gives the option to Snooze it. Pt needs assistance.

## 2020-07-06 NOTE — Telephone Encounter (Signed)
REFERRAL PLACED

## 2020-07-07 ENCOUNTER — Encounter: Payer: Self-pay | Admitting: Podiatry

## 2020-07-07 MED FILL — ATORVASTATIN CALCIUM 40 MG: 40 | 30 days supply | Qty: 30 | Fill #1

## 2020-07-07 MED FILL — IBUPROFEN 800 MG TABLET: 800 | 20 days supply | Qty: 60 | Fill #0

## 2020-07-07 MED FILL — ESCITALOPRAM 10 MG TABLET: 10 | 30 days supply | Qty: 30 | Fill #1

## 2020-07-07 NOTE — Progress Notes (Signed)
  Subjective:  Patient ID: Anne Reese, female    DOB: 06-24-1976,  MRN: [id number]  Chief Complaint  Patient presents with  . Foot Pain    Bilateral foot pain Pain is worse in the morning some days and then some days it will be at night. Pt stated that when she gets off work her foot will be so sore and red she can not stand to apply pressure on it. Said she was diagnosed with plantar fascittis when she was younger and she has bilateral corn and callous     44 y.o. female presents with the above complaint. History confirmed with patient.  She previously has had a plantar fascial release with a doctor in [place] in 2019.  She still has pain with this.  She also complains of corns under the fifth metatarsals bilaterally  Objective:  Physical Exam: warm, good capillary refill, no trophic changes or ulcerative lesions, normal DP and PT pulses and normal sensory exam.  Bilaterally she has Pain on palpation to plantar fascia insertion on plantar calcaneus.  Hyperkeratosis submet 5  Radiographs: X-ray of both feet: no fracture, dislocation, swelling or degenerative changes noted, plantar calcaneal spur and posterior calcaneal spur Assessment:   1. Foot pain, bilateral   2. Plantar fasciitis of right foot   3. Plantar fasciitis of left foot   4. Callus of foot   5. Heel spur, left   6. Heel spur, right      Plan:  Patient was evaluated and treated and all questions answered.  Discussed the etiology and treatment options for plantar fasciitis including stretching, formal physical therapy, supportive shoegears such as a running shoe or sneaker, pre fabricated orthoses, injection therapy, and oral medications. We also discussed the role of surgical treatment of this for patients who do not improve after exhausting non-surgical treatment options. Discussed with her that the right side seem be purely difficult to treat because it is recalcitrant plantar fasciitis after she has had a release  before.   -XR reviewed with patient -Educated patient on stretching and icing of the affected limb -Rx for meloxicam. Educated on use, risks and benefits of the medication  -I recommend an injection of both feet today.  She did not tolerate these well previously and felt like they did not help at all and I think this is understandable to avoid this for now. -I recommend she begin physical therapy to treat this.  Referral sent to [facility] for her. -She also has custom orthotics that were previously made for her, she says they are no longer comfortable, I asked her to bring these with her to her next visit to see if these are appropriate for her and if they need adjustment   Return in about 1 month (around 08/05/2020).

## 2020-07-07 NOTE — Telephone Encounter (Signed)
Spoke to patient and informed on referral.

## 2020-07-12 ENCOUNTER — Encounter: Payer: 59 | Admitting: Physical Therapy

## 2020-07-13 ENCOUNTER — Ambulatory Visit: Payer: Self-pay

## 2020-07-13 NOTE — Telephone Encounter (Signed)
Patient called stating that she has hypertension undiagnosed. She does not take medication Today at [facility] her BP was 170/165.  She goes there where her mother works to have readings done.  She was unable to check a second reading She has no machine at home. She states that she has SOB, blurred vision, and sometimes headaches. Per protocol patient will go to ER for evaluation. Care advice was read to patient she verbalized understanding of all information.  Reason for Disposition . [8] Systolic BP  >= 984 OR Diastolic >= 210 AND [3] cardiac or neurologic symptoms (e.g., chest pain, difficulty breathing, unsteady gait, blurred vision)  Answer Assessment - Initial Assessment Questions 1. BLOOD PRESSURE: "What is the blood pressure?" "Did you take at least two measurements 5 minutes apart?"     170/165 2. ONSET: "When did you take your blood pressure?"     today 3. HOW: "How did you obtain the blood pressure?" (e.g., visiting nurse, automatic home BP monitor)     [facility] mom [facility] 4. HISTORY: "Do you have a history of high blood pressure?"    no 5. MEDICATIONS: "Are you taking any medications for blood pressure?" "Have you missed any doses recently?"     none 6. OTHER SYMPTOMS: "Do you have any symptoms?" (e.g., headache, chest pain, blurred vision, difficulty breathing, weakness)     Difficulty breathing, vision blurred, dizzy at times some headache 7. PREGNANCY: "Is there any chance you are pregnant?" "When was your last menstrual period?"     histerectomy  Protocols used: BLOOD PRESSURE - HIGH-A-AH

## 2020-07-14 ENCOUNTER — Ambulatory Visit
Admission: EM | Admit: 2020-07-14 | Discharge: 2020-07-14 | Disposition: A | Discharge status: Home / Self Care | Payer: 59 | Attending: Emergency Medicine | Admitting: Emergency Medicine

## 2020-07-14 ENCOUNTER — Other Ambulatory Visit: Payer: Self-pay

## 2020-07-14 ENCOUNTER — Other Ambulatory Visit: Payer: Self-pay | Admitting: Podiatry

## 2020-07-14 DIAGNOSIS — I10 Essential (primary) hypertension: Secondary | ICD-10-CM

## 2020-07-14 DIAGNOSIS — M722 Plantar fascial fibromatosis: Secondary | ICD-10-CM

## 2020-07-14 MED ORDER — AMLODIPINE BESYLATE 5 MG PO TABS: 5.0000 mg | ORAL_TABLET | Freq: Every day | ORAL | 0 refills | Status: DC

## 2020-07-14 NOTE — ED Triage Notes (Signed)
Pt states for the last 2 weeks her blood pressure has been elevated at the donation site she goes to. Pt complains of blurred vision and headaches and states her bp was 175/135. Pt is aox4 and ambulatory.

## 2020-07-14 NOTE — Discharge Instructions (Signed)
Amlodipine daily 5 mg Follow up with primary care in 2-4 weeks for recheck with primary care Return if worsening/changing

## 2020-07-14 NOTE — ED Provider Notes (Signed)
[facility]    CSN: [id number] Arrival date & time: 07/14/20  9371      History   Chief Complaint Chief Complaint  Patient presents with  . Hypertension    x 2 weeks    HPI Anne Reese is a 44 y.o. female presenting today for evaluation of elevated blood pressure. Reports elevated pressures x 1-2 years. Donates plasma, unable to donate due to pressure. Reports headaches, occasional blurred vision, fatigue and short of breath. Denies chest pressure. Reports blood pressure of 175/135. PCP at [facility].   HPI  Past Medical History:  Diagnosis Date  . Asthma    no inhaler use x 15 yrs  . Complication of anesthesia    asthma attack in PACU  . Depression   . Diverticulitis   . Fibroids 2013  . Hyperlipidemia     Patient Active Problem List   Diagnosis Date Noted  . Fecal incontinence 03/17/2020  . Diarrhea 03/17/2020  . Status post surgery 01/19/2020  . Ruptured cyst of left ovary 01/19/2020  . Hemorrhagic cyst of left ovary 01/18/2020  . 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 ABDOMINAL HYSTERECTOMY N/A 06/24/2013   Procedure: HYSTERECTOMY ABDOMINAL;  Surgeon: Betsy Coder, MD;  Location: [facility];  Service: Gynecology;  Laterality: N/A;  . BILATERAL SALPINGECTOMY Bilateral 06/24/2013   Procedure: BILATERAL SALPINGECTOMY;  Surgeon: Betsy Coder, MD;  Location: [facility];  Service: Gynecology;  Laterality: Bilateral;  . CYSTOSCOPY N/A 06/24/2013   Procedure: CYSTOSCOPY;  Surgeon: Betsy Coder, MD;  Location: [facility];  Service: Gynecology;  Laterality: N/A;  . DILATION AND CURETTAGE OF UTERUS N/A 06/24/2013   Procedure: DILATATION AND CURETTAGE;  Surgeon: Betsy Coder, MD;  Location: [facility];  Service: Gynecology;  Laterality: N/A;  . GANGLION CYST EXCISION Left   . LAPAROSCOPY N/A 01/18/2020   Procedure: LAPAROSCOPY DIAGNOSTIC, WITH EVACTION OF  HEMOPERITONEUM;  Surgeon: Sloan Leiter, MD;  Location: [facility];  Service: Gynecology;  Laterality: N/A;  . TUBAL LIGATION      OB History    Gravida  3   Para      Term      Preterm      AB      Living  3     SAB      TAB      Ectopic      Multiple      Live Births               Home Medications    Prior to Admission medications   Medication Sig Start Date End Date Taking? Authorizing Provider  atorvastatin (LIPITOR) 40 MG tablet Take 1 tablet (40 mg total) by mouth daily. 12/22/19  Yes Gildardo Pounds, NP  escitalopram (LEXAPRO) 10 MG tablet Take 1 tablet (10 mg total) by mouth daily. 03/17/20 07/14/20 Yes Gildardo Pounds, NP  famotidine (PEPCID) 20 MG tablet Take 1 tablet (20 mg total) by mouth 2 (two) times daily. 03/17/20  Yes Noralyn Pick, NP  ibuprofen (ADVIL) 800 MG tablet Take 1 tablet (800 mg total) by mouth every 8 (eight) hours as needed. 05/20/20  Yes Gildardo Pounds, NP  levocetirizine (XYZAL) 5 MG tablet Take 1 tablet (5 mg total) by mouth at bedtime. 04/22/20  Yes Scot Jun, FNP  meloxicam (MOBIC) 15 MG tablet Take 1 tablet (15 mg total)  by mouth daily. 07/06/20  Yes McDonald, Stephan Minister, DPM  nicotine (NICODERM CQ - DOSED IN MG/24 HR) 7 mg/24hr patch Place 1 patch (7 mg total) onto the skin daily. 05/28/20  Yes Gildardo Pounds, NP  Vitamin D, Ergocalciferol, (DRISDOL) 1.25 MG (50000 UNIT) CAPS capsule Take 1 capsule (50,000 Units total) by mouth every 7 (seven) days. 12/22/19  Yes Gildardo Pounds, NP  amLODipine (NORVASC) 5 MG tablet Take 1 tablet (5 mg total) by mouth daily. 07/14/20   Adit Riddles, Elesa Hacker, PA-C    Family History Family History  Problem Relation Age of Onset  . Hypertension Maternal Grandfather   . Colon cancer Maternal Grandfather   . Prostate cancer Maternal Grandfather   . Hypertension Mother   . Sickle cell anemia Maternal Grandmother   . Hypertension Maternal Grandmother   . Heart disease Maternal Grandmother     . Breast cancer Other        maternal great aunt  . Brain cancer Other        "                "       "  . HIV Brother   . Lupus Daughter     Social History Social History   Tobacco Use  . Smoking status: Current Every Day Smoker    Packs/day: 1.00    Types: Cigarettes  . Smokeless tobacco: Never Used  Vaping Use  . Vaping Use: Never used  Substance Use Topics  . Alcohol use: Yes    Comment: occasional  . Drug use: No     Allergies   Patient has no known allergies.   Review of Systems Review of Systems  Constitutional: Negative for fatigue and fever.  HENT: Negative for congestion, sinus pressure and sore throat.   Eyes: Negative for photophobia, pain and visual disturbance.  Respiratory: Negative for cough and shortness of breath.   Cardiovascular: Negative for chest pain.  Gastrointestinal: Negative for abdominal pain, nausea and vomiting.  Genitourinary: Negative for decreased urine volume and hematuria.  Musculoskeletal: Negative for myalgias, neck pain and neck stiffness.  Neurological: Positive for headaches. Negative for dizziness, syncope, facial asymmetry, speech difficulty, weakness, light-headedness and numbness.     Physical Exam Triage Vital Signs ED Triage Vitals  Enc Vitals Group     BP 07/14/20 0935 (!) 160/99     Pulse Rate 07/14/20 0935 83     Resp 07/14/20 0935 18     Temp 07/14/20 0935 98 F (36.7 C)     Temp Source 07/14/20 0935 Oral     SpO2 07/14/20 0935 98 %     Weight --      Height --      Head Circumference --      Peak Flow --      Pain Score 07/14/20 0937 0     Pain Loc --      Pain Edu? --      Excl. in [place]? --    No data found.  Updated Vital Signs BP (!) 160/99 (BP Location: Left Arm)   Pulse 83   Temp 98 F (36.7 C) (Oral)   Resp 18   LMP 05/19/2013 Comment: Negative HCG in ED today  SpO2 98%   Visual Acuity Right Eye Distance:   Left Eye Distance:   Bilateral Distance:    Right Eye Near:   Left Eye  Near:    Bilateral Near:  Physical Exam Vitals and nursing note reviewed.  Constitutional:      Appearance: She is well-developed.     Comments: No acute distress  HENT:     Head: Normocephalic and atraumatic.     Nose: Nose normal.  Eyes:     Extraocular Movements: Extraocular movements intact.     Conjunctiva/sclera: Conjunctivae normal.     Pupils: Pupils are equal, round, and reactive to light.  Cardiovascular:     Rate and Rhythm: Normal rate.  Pulmonary:     Effort: Pulmonary effort is normal. No respiratory distress.  Abdominal:     General: There is no distension.  Musculoskeletal:        General: Normal range of motion.     Cervical back: Neck supple.  Skin:    General: Skin is warm and dry.  Neurological:     General: No focal deficit present.     Mental Status: She is alert and oriented to person, place, and time. Mental status is at baseline.     Cranial Nerves: No cranial nerve deficit.     Motor: No weakness.      UC Treatments / Results  Labs (all labs ordered are listed, but only abnormal results are displayed) Labs Reviewed - No data to display  EKG   Radiology No results found.  Procedures Procedures (including critical care time)  Medications Ordered in UC Medications - No data to display  Initial Impression / Assessment and Plan / UC Course  I have reviewed the triage vital signs and the nursing notes.  Pertinent labs & imaging results that were available during my care of the patient were reviewed by me and considered in my medical decision making (see chart for details).     Multiple hypertensive readings over the past 2 months, reported readings elevated every the past 1 to 2 years.  Initiating patient on amlodipine 5 mg daily.  Discussed follow-up with PCP in 2 to 4 weeks for blood pressure recheck, may need titration of meds, addition of meds.  Also would benefit from ophthalmology/optometry evaluation.  Discussed strict return  precautions. Patient verbalized understanding and is agreeable with plan.  Final Clinical Impressions(s) / UC Diagnoses   Final diagnoses:  Essential hypertension     Discharge Instructions     Amlodipine daily 5 mg Follow up with primary care in 2-4 weeks for recheck with primary care Return if worsening/changing    ED Prescriptions    Medication Sig Dispense Auth. Provider   amLODipine (NORVASC) 5 MG tablet Take 1 tablet (5 mg total) by mouth daily. 60 tablet Maryln Eastham, [place] C, PA-C     PDMP not reviewed this encounter.   Janith Lima, PA-C 07/14/20 1012

## 2020-07-16 NOTE — Telephone Encounter (Signed)
Noted. Agree with recommendations for ED evaluation

## 2020-07-19 ENCOUNTER — Encounter: Payer: 59 | Admitting: Physical Therapy

## 2020-07-26 ENCOUNTER — Encounter: Payer: 59 | Admitting: Physical Therapy

## 2020-08-02 ENCOUNTER — Encounter: Payer: 59 | Admitting: Physical Therapy

## 2020-08-04 MED FILL — ESCITALOPRAM 10 MG TABLET: 10 | 30 days supply | Qty: 30 | Fill #1

## 2020-08-04 MED FILL — IBUPROFEN 800 MG TABLET: 800 | 20 days supply | Qty: 60 | Fill #0

## 2020-08-04 MED FILL — ATORVASTATIN CALCIUM 40 MG: 40 | 30 days supply | Qty: 30 | Fill #1

## 2020-08-05 ENCOUNTER — Ambulatory Visit (INDEPENDENT_AMBULATORY_CARE_PROVIDER_SITE_OTHER): Payer: 59 | Admitting: Podiatry

## 2020-08-05 ENCOUNTER — Other Ambulatory Visit: Payer: Self-pay

## 2020-08-05 ENCOUNTER — Encounter: Payer: Self-pay | Admitting: Podiatry

## 2020-08-05 DIAGNOSIS — M7731 Calcaneal spur, right foot: Secondary | ICD-10-CM | POA: Diagnosis not present

## 2020-08-05 DIAGNOSIS — M7732 Calcaneal spur, left foot: Secondary | ICD-10-CM | POA: Diagnosis not present

## 2020-08-05 DIAGNOSIS — M722 Plantar fascial fibromatosis: Secondary | ICD-10-CM

## 2020-08-05 NOTE — Patient Instructions (Signed)
From [employer]:  Short CAM boot, size small Even UP Brace for opposite shoe    [facility]:  [phone]  Call to make an appointment

## 2020-08-09 ENCOUNTER — Encounter: Payer: 59 | Admitting: Physical Therapy

## 2020-08-09 NOTE — Progress Notes (Signed)
  Subjective:  Patient ID: Anne Reese, female    DOB: 02-Mar-1976,  MRN: [id number]  Chief Complaint  Patient presents with  . Follow-up    Pt states her bilateral foot pain has got worse than before.     44 y.o. female returns with the above complaint. History confirmed with patient.  She did not begin physical therapy as she has not heard from them.  Injection was not helpful.  Objective:  Physical Exam: warm, good capillary refill, no trophic changes or ulcerative lesions, normal DP and PT pulses and normal sensory exam.  Bilaterally she has Pain on palpation to plantar fascia insertion on plantar calcaneus.    Radiographs: X-ray of both feet: no fracture, dislocation, swelling or degenerative changes noted, plantar calcaneal spur and posterior calcaneal spur Assessment:   No diagnosis found.   Plan:  Patient was evaluated and treated and all questions answered.  -Continue stretching and icing at home -Recommend she begin physical therapy.  I gave her the information to contact [facility] to set up an appointment -I think a CAM boot to them increase her immobilization will be helpful for support.  She should wear this on the right side.  Advised her to get one on [employer] although I also offered her to write a prescription for [facility] she will begin wearing this and and even up brace on the other foot -Continue meloxicam -Consider repeat injection if not better at next visit   Return in about 3 weeks (around 08/26/2020).

## 2020-08-16 ENCOUNTER — Encounter: Payer: 59 | Admitting: Physical Therapy

## 2020-08-18 ENCOUNTER — Ambulatory Visit: Payer: 59 | Admitting: Nurse Practitioner

## 2020-08-26 ENCOUNTER — Ambulatory Visit: Payer: 59 | Admitting: Podiatry

## 2020-09-09 ENCOUNTER — Ambulatory Visit: Payer: Medicaid - Out of State | Admitting: Podiatry

## 2020-09-19 ENCOUNTER — Ambulatory Visit (HOSPITAL_BASED_OUTPATIENT_CLINIC_OR_DEPARTMENT_OTHER): Payer: 59 | Attending: Nurse Practitioner | Admitting: Internal Medicine

## 2020-09-21 ENCOUNTER — Telehealth (INDEPENDENT_AMBULATORY_CARE_PROVIDER_SITE_OTHER): Payer: 59 | Admitting: Family

## 2020-09-21 DIAGNOSIS — R159 Full incontinence of feces: Secondary | ICD-10-CM

## 2020-09-21 DIAGNOSIS — Z0100 Encounter for examination of eyes and vision without abnormal findings: Secondary | ICD-10-CM

## 2020-09-21 DIAGNOSIS — I10 Essential (primary) hypertension: Secondary | ICD-10-CM | POA: Diagnosis not present

## 2020-09-21 DIAGNOSIS — Z7689 Persons encountering health services in other specified circumstances: Secondary | ICD-10-CM | POA: Diagnosis not present

## 2020-09-21 DIAGNOSIS — N3946 Mixed incontinence: Secondary | ICD-10-CM

## 2020-09-21 NOTE — Progress Notes (Signed)
Establish care  Switching from [facility]  Needs refill on Atorvastatin, Meloxicam, Amlodipine, Escitalopram, Pepcid, ibroprofen , needs second part of nicotine patch,

## 2020-09-21 NOTE — Progress Notes (Signed)
Virtual Visit via Telephone Note  I connected with Anne Reese, on 09/21/2020 at 1:45 AM by telephone due to the COVID-19 pandemic and verified that I am speaking with the correct person using two identifiers.  Due to current restrictions/limitations of in-office visits due to the COVID-19 pandemic, this scheduled clinical appointment was converted to a telehealth visit.   Consent: I discussed the limitations, risks, security and privacy concerns of performing an evaluation and management service by telephone and the availability of in person appointments. I also discussed with the patient that there may be a patient responsible charge related to this service. The patient expressed understanding and agreed to proceed.  Location of Patient: Home  Location of Provider: [facility] [employer] participating in Telemedicine visit: SUKAINA TOOTHAKER Durene Fruits, NP [place] Else, [place]  History of Present Illness: Anne Reese is a 45 year-old female who presents to establish care. PMH of hemorrhagic cyst of left ovary, ruptured cyst of left ovary, elevated lipoprotein(a), fecal incontinence, diarrhea, and S/P TAH-BSO.   Current issues and/or concerns:  1. HYPERTENSION: 07/14/2020 visit at [facility] per PA note: Multiple hypertensive readings over the past 2 months, reported readings elevated every the past 1 to 2 years.  Initiating patient on amlodipine 5 mg daily.  Discussed follow-up with PCP in 2 to 4 weeks for blood pressure recheck, may need titration of meds, addition of meds.  Also would benefit from ophthalmology/optometry evaluation.  09/22/2020: Currently taking: see medication list Med Adherence: [x]  Yes    []  No Medication side effects: []  Yes    [x]  No Adherence with salt restriction (low-salt diet): [x]  Yes, trying Exercise: while at work Home Monitoring?: [x]  Yes    []  No Monitoring Frequency: []  Yes    [x]  No Home BP results  range: 170s/100s on one occasion, not monitoring consistently Smoking [x]  Yes, began 6 months ago, 15 cigarettes daily SOB? [x]  Yes    []  No Chest Pain?: Not chest pain but does feel a flutter sometimes  Leg swelling?: []  Yes    [x]  No Headaches?: [x]  Yes    []  No Dizziness? [x]  Yes    []  No Comments: reports vision is blurry, does wear bifocals but makes her feel off balance, has been several years since eye exam   2. FECAL INCONTINENCE: 03/17/2020 [facility] per NP note: 45 year old female with diarrhea and  fecal incontinence -GI pathogen panel, CBC, CMP, TSH, CRP, tTg and IGA -Benefiber 1 TBSP daily -Pepto bismal 1 to 2 tabs daily as needed to increase anal sphincter tone -Request colonoscopy procedure and biopsy report from the [facility] -Pelvic floor physical therapy referral  -Follow up with Dr. Silverio Decamp in 4 to 6 weeks  -Patient to call our office if he symptoms worsen  -Consider trial with Xifaxan if GI pathogen panel negative   Epigastric pain -Famotidine 20mg  one po bid (PPI deferred for now due to potential concerns for diarrhea side effect) -No NSAIDS recommended  -Discussed EGD if not done at [facility]  History of colon polyps -Request copy of 01/2019 colonoscopy report   Smoker -Advised smoking cessation   ADDENDUM: Records from Dr. Madelyn Brunner received.  EGD and colonoscopy were done 01/23/2019: EGD showed mild antral gastropathy s/p biopsies for H. Pylori. Otherwise normal EGD.  Colonoscopy: Fair prep. Diminutive sessile polp s/p cold forcep biopsy.  Small internal hemorrhoids.  Repeat  colonoscopy in 3 years.   09/21/2020: Today reports still experiencing incontinence symptoms. Reports decreased muscle tone of rectum. Reports physical therapy in the past did not help. Concerns for being able to function at her job because of frequent bowel movements. Would like referral back to  Gastroenterology.   3. URINARY INCONTINENCE: 02/12/2020 [facility] per MD note: Referred to Urogynecology at that time.    09/22/2020: Today reports still having urination with cough or sneeze. Says she was placed on a pill for this in the past and it did not help. Requesting referral back to Urogynecology.    Past Medical History:  Diagnosis Date  . Asthma    no inhaler use x 15 yrs  . Complication of anesthesia    asthma attack in PACU  . Depression   . Diverticulitis   . Fibroids 2013  . Hyperlipidemia    No Known Allergies  Current Outpatient Medications on File Prior to Visit  Medication Sig Dispense Refill  . amLODipine (NORVASC) 5 MG tablet Take 1 tablet (5 mg total) by mouth daily. 60 tablet 0  . atorvastatin (LIPITOR) 40 MG tablet Take 1 tablet (40 mg total) by mouth daily. 90 tablet 3  . escitalopram (LEXAPRO) 10 MG tablet Take 1 tablet (10 mg total) by mouth daily. 30 tablet 3  . famotidine (PEPCID) 20 MG tablet Take 1 tablet (20 mg total) by mouth 2 (two) times daily. 60 tablet 1  . ibuprofen (ADVIL) 800 MG tablet Take 1 tablet (800 mg total) by mouth every 8 (eight) hours as needed. 60 tablet 1  . levocetirizine (XYZAL) 5 MG tablet Take 1 tablet (5 mg total) by mouth at bedtime. 30 tablet 0  . meloxicam (MOBIC) 15 MG tablet Take 1 tablet (15 mg total) by mouth daily. 30 tablet 3  . nicotine (NICODERM CQ - DOSED IN MG/24 HR) 7 mg/24hr patch Place 1 patch (7 mg total) onto the skin daily. 28 patch 0  . Vitamin D, Ergocalciferol, (DRISDOL) 1.25 MG (50000 UNIT) CAPS capsule Take 1 capsule (50,000 Units total) by mouth every 7 (seven) days. 12 capsule 1   No current facility-administered medications on file prior to visit.    Observations/Objective: Alert and oriented x 3. Not in acute distress. Physical examination not completed as this is a telemedicine visit.  Assessment and Plan: 1. Encounter to establish  care: - Patient presents today to establish care.  - Return for annual physical examination, labs, and health maintenance. Arrive fasting meaning having had no food and/or nothing to drink for at least 8 hours prior to appointment.  2. Essential hypertension: - Level of blood pressure control unknown as patient does not consistently monitor at home.  - Increase Amlodipine from 5 mg daily to 10 mg daily.  - Counseled on blood pressure goal of less than 130/80, low-sodium, DASH diet, medication compliance, 150 minutes of moderate intensity exercise per week as tolerated. Discussed medication compliance, adverse effects. - Blood pressure check within 4 weeks or sooner with primary provider.  - Patient was given clear instructions to go to Emergency Department or return to medical center if symptoms don't improve, worsen, or new problems develop.The patient verbalized understanding. - amLODipine (NORVASC) 10 MG tablet; Take 1 tablet (10 mg total) by mouth daily.  Dispense: 30 tablet; Refill: 0  3. Incontinence of feces, unspecified fecal incontinence type: - Last visit with Gastroenterology August 2021.  - Per patient request  referral back to Gastroenterology for further evaluation and management.  - Follow-up with primary provider as needed.  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  - Per patient request referral back to Urogynecology for further evaluation and management.  - Ambulatory referral to Urogynecology  5. Eye exam, routine: - Patient does wear bifocals.  - Referral to Ophthalmology for routine eye examination. - Ambulatory referral to Ophthalmology   Follow Up Instructions: Follow-up within 4 weeks or sooner if needed for blood pressure check and physical examination.    Patient was given clear instructions to go to Emergency Department or return to medical center if symptoms  don't improve, worsen, or new problems develop.The patient verbalized understanding.  I discussed the assessment and treatment plan with the patient. The patient was provided an opportunity to ask questions and all were answered. The patient agreed with the plan and demonstrated an understanding of the instructions.   The patient was advised to call back or seek an in-person evaluation if the symptoms worsen or if the condition fails to improve as anticipated.   I provided 15 minutes total of non-face-to-face time during this encounter including median intraservice time, reviewing previous notes, labs, imaging, medications, management and patient verbalized understanding.    Camillia Herter, NP  [facility] at [place], [place] 09/21/2020, 8:35 AM

## 2020-09-21 NOTE — Patient Instructions (Signed)

## 2020-09-22 MED ORDER — AMLODIPINE BESYLATE 10 MG PO TABS: 10.0000 mg | ORAL_TABLET | Freq: Every day | ORAL | 0 refills | Status: DC

## 2020-10-25 ENCOUNTER — Encounter: Payer: 59 | Admitting: Family

## 2020-10-28 ENCOUNTER — Telehealth: Payer: Self-pay | Admitting: Nurse Practitioner

## 2020-10-28 NOTE — Telephone Encounter (Signed)
1) Medication(s) Requested (by name): MELOXICAM & ATORVASTATIN & LEXAPRO  2) Pharmacy of Choice: [facility]  3) Special Requests:  PCP Durene Fruits NP at [facility]   Approved medications will be sent to the pharmacy, we will reach out if there is an issue.  Requests made after 3pm may not be addressed until the following business day!  If a patient is unsure of the name of the medication(s) please note and ask patient to call back when they are able to provide all info, do not send to responsible party until all information is available!

## 2020-11-04 NOTE — Telephone Encounter (Signed)
Pt contacted provider

## 2020-11-15 ENCOUNTER — Other Ambulatory Visit: Payer: Self-pay

## 2020-11-15 ENCOUNTER — Telehealth (INDEPENDENT_AMBULATORY_CARE_PROVIDER_SITE_OTHER): Payer: Self-pay | Admitting: Family

## 2020-11-15 DIAGNOSIS — I10 Essential (primary) hypertension: Secondary | ICD-10-CM

## 2020-11-15 DIAGNOSIS — F17200 Nicotine dependence, unspecified, uncomplicated: Secondary | ICD-10-CM

## 2020-11-15 DIAGNOSIS — M7732 Calcaneal spur, left foot: Secondary | ICD-10-CM

## 2020-11-15 DIAGNOSIS — M7731 Calcaneal spur, right foot: Secondary | ICD-10-CM

## 2020-11-15 DIAGNOSIS — E7841 Elevated Lipoprotein(a): Secondary | ICD-10-CM

## 2020-11-15 DIAGNOSIS — F419 Anxiety disorder, unspecified: Secondary | ICD-10-CM

## 2020-11-15 DIAGNOSIS — M722 Plantar fascial fibromatosis: Secondary | ICD-10-CM

## 2020-11-15 DIAGNOSIS — F32A Depression, unspecified: Secondary | ICD-10-CM

## 2020-11-15 DIAGNOSIS — K219 Gastro-esophageal reflux disease without esophagitis: Secondary | ICD-10-CM

## 2020-11-15 MED ORDER — ATORVASTATIN CALCIUM 40 MG PO TABS
40.0000 mg | ORAL_TABLET | Freq: Every day | ORAL | 0 refills | Status: DC
  Filled 2020-11-15: qty 30, 30d supply, fill #0

## 2020-11-15 MED ORDER — MELOXICAM 15 MG PO TABS
15.0000 mg | ORAL_TABLET | Freq: Every day | ORAL | 3 refills | Status: DC
  Filled 2020-11-15: qty 30, 30d supply, fill #0

## 2020-11-15 MED ORDER — ESCITALOPRAM OXALATE 10 MG PO TABS
10.0000 mg | ORAL_TABLET | Freq: Every day | ORAL | 2 refills | Status: DC
  Filled 2020-11-15: qty 30, 30d supply, fill #0

## 2020-11-15 MED ORDER — AMLODIPINE BESYLATE 10 MG PO TABS
10.0000 mg | ORAL_TABLET | Freq: Every day | ORAL | 0 refills | Status: DC
  Filled 2020-11-15: qty 30, 30d supply, fill #0

## 2020-11-15 NOTE — Progress Notes (Signed)
Virtual Visit via Telephone Note  I connected with Anne Reese, on 11/15/2020 at 11:12 AM by telephone due to the COVID-19 pandemic and verified that I am speaking with the correct person using two identifiers.  Due to current restrictions/limitations of in-office visits due to the COVID-19 pandemic, this scheduled clinical appointment was converted to a telehealth visit.   Consent: I discussed the limitations, risks, security and privacy concerns of performing an evaluation and management service by telephone and the availability of in person appointments. I also discussed with the patient that there may be a patient responsible charge related to this service. The patient expressed understanding and agreed to proceed.  Location of Patient: Home   Location of Provider: [facility] [place] participating in Telemedicine visit: AURIEL KIST Durene Fruits, NP [place] Else, [place]   History of Present Illness: Anne Tersigni. Reese is a 45 year-old female who presents for hypertension follow-up.   1. HYPERTENSION FOLLOW-UP: 09/21/2020: - Level of blood pressure control unknown as patient does not consistently monitor at home.  - Increase Amlodipine from 5 mg daily to 10 mg daily.   11/15/2020:  Currently taking: see medication list Med Adherence: [x]  Yes    []  No Medication side effects: []  Yes    [x]  No  Home Monitoring?: []  Yes    [x]  No Monitoring Frequency: []  Yes    [x]  No Home BP results range: []  Yes    [x]  No  Smoking [x]  Yes []  No SOB? []  Yes    [x]  No Chest Pain?: []  Yes    [x]  No Leg swelling?: [x]  Yes, reports left ankle swelling began 2 weeks ago. Unsure if it worsens throughout the day, reports not paying too much attention to it currently. May be related to standing at least 10 hours on her feet daily at work. Also history of bone spurs. Not ready to change blood pressure medication just yet, plans to continue to monitor and notify provider if  worsening. Headaches?: []  Yes    [x]  No Dizziness? []  Yes    [x]  No   2. HIGH CHOLESTEROL FOLLOW-UP: Requesting refills of Atorvastatin.   3. ANXIETY FOLLOW-UP: 03/17/2020 per NP note: - Continued on Escitalopram.   11/15/2020: Primarily related to life balance. Requesting refills. Duration:stable Anxious mood: yes  Excessive worrying: yes mostly about family  Irritability: yes  Sweating: no Nausea: no Palpitations:yes Hyperventilation: no Panic attacks: sometimes Depressed mood: yes  Insomnia: no  Fatigue/loss of energy: yes Feelings of worthlessness: no Feelings of guilt: no Impaired concentration/indecisiveness: yes Suicidal ideations, homicidal ideations, self-harm: no  Crying spells: no Recent Stressors/Life Changes: yes   Relationship problems: no   Family stress: no     Financial stress: no    Job stress: yes    Recent death/loss: no   4. PLANTAR FASCIITIS AND HEEL SPURS FOLLOW-UP: Bilateral and ongoing. Taking Ibuprofen which doesn't help much. Was seeing Podiatry and prescribed Meloxicam, requesting refills.    Past Medical History:  Diagnosis Date  . Asthma    no inhaler use x 15 yrs  . Complication of anesthesia    asthma attack in PACU  . Depression   . Diverticulitis   . Fibroids 2013  . Hyperlipidemia    No Known Allergies  Current Outpatient Medications on File Prior to Visit  Medication Sig Dispense Refill  . amLODipine (NORVASC) 10 MG tablet Take 1 tablet (10 mg total) by mouth daily. 30 tablet 0  .  atorvastatin (LIPITOR) 40 MG tablet TAKE 1 TABLET (40 MG TOTAL) BY MOUTH DAILY. 90 tablet 3  . escitalopram (LEXAPRO) 10 MG tablet TAKE 1 TABLET (10 MG TOTAL) BY MOUTH DAILY. 30 tablet 3  . famotidine (PEPCID) 20 MG tablet Take 1 tablet (20 mg total) by mouth 2 (two) times daily. 60 tablet 1  . ibuprofen (ADVIL) 800 MG tablet TAKE 1 TABLET (800 MG TOTAL) BY MOUTH EVERY 8 (EIGHT) HOURS AS NEEDED. 60 tablet 1  . levocetirizine (XYZAL) 5 MG tablet Take  1 tablet (5 mg total) by mouth at bedtime. 30 tablet 0  . meloxicam (MOBIC) 15 MG tablet TAKE 1 TABLET (15 MG TOTAL) BY MOUTH DAILY. 30 tablet 3  . nicotine (NICODERM CQ - DOSED IN MG/24 HR) 7 mg/24hr patch Place 1 patch (7 mg total) onto the skin daily. 28 patch 0  . Vitamin D, Ergocalciferol, (DRISDOL) 1.25 MG (50000 UNIT) CAPS capsule Take 1 capsule (50,000 Units total) by mouth every 7 (seven) days. (Patient not taking: Reported on 09/21/2020) 12 capsule 1   No current facility-administered medications on file prior to visit.    Observations/Objective: Alert and oriented x 3. Not in acute distress. Physical examination not completed as this is a telemedicine visit.  Assessment and Plan: 1. Essential hypertension: - Level of blood pressure control unknown as patient does not monitor at home.  - Continue Amlodipine as prescribed.  - Counseled on blood pressure goal of less than 130/80, low-sodium, DASH diet, medication compliance, 150 minutes of moderate intensity exercise per week as tolerated. Discussed medication compliance, adverse effects. - Counseled that next visit should be an office visit, if possible, for medication refills. Today's visit and previous visit have been telemedicine and would like to evaluate patient in-person as well as update blood work. Patient agreeable.  - amLODipine (NORVASC) 10 MG tablet; Take 1 tablet (10 mg total) by mouth daily.  Dispense: 30 tablet; Refill: 0  2. Elevated lipoprotein(a): -Practice low-fat heart healthy diet and at least 150 minutes of moderate intensity exercise weekly as tolerated.  - Continue Atorvastatin as prescribed. - Last lipid panel obtained 12/25/2019. - Follow-up with primary provider as scheduled.  - atorvastatin (LIPITOR) 40 MG tablet; Take 1 tablet (40 mg total) by mouth daily.  Dispense: 120 tablet; Refill: 0  3. Anxiety and depression: - Stable.  - Denies thoughts of self-harm, suicidal ideations, and homicidal ideations.   - Continue Escitalopram as prescribed. May cause drowsiness. Counseled patient to not consume if operating heavy machinery or driving. Counseled patient to not consume with alcohol. - Follow-up with primary provider as scheduled. - escitalopram (LEXAPRO) 10 MG tablet; Take 1 tablet (10 mg total) by mouth daily.  Dispense: 30 tablet; Refill: 2  4. Plantar fasciitis, bilateral: 5. Heel spur, left: 6. Heel spur, right: - Continue Meloxicam as prescribed. - Follow-up with primary provider as scheduled.  - meloxicam (MOBIC) 15 MG tablet; Take 1 tablet (15 mg total) by mouth daily.  Dispense: 30 tablet; Refill: 3   Follow Up Instructions: Follow-up with primary provider in 2 weeks or sooner if needed for blood pressure check.   Patient was given clear instructions to go to Emergency Department or return to medical center if symptoms don't improve, worsen, or new problems develop.The patient verbalized understanding.  I discussed the assessment and treatment plan with the patient. The patient was provided an opportunity to ask questions and all were answered. The patient agreed with the plan and demonstrated an understanding of  the instructions.   The patient was advised to call back or seek an in-person evaluation if the symptoms worsen or if the condition fails to improve as anticipated.   I provided 20 minutes total of non-face-to-face time during this encounter including median intraservice time, reviewing previous notes, labs, imaging, medications, management and patient verbalized understanding.    Camillia Herter, NP  [facility] at [facility] [place], [place] 11/15/2020, 11:12 AM

## 2020-11-20 ENCOUNTER — Other Ambulatory Visit: Payer: Self-pay | Admitting: Family

## 2020-11-20 DIAGNOSIS — I10 Essential (primary) hypertension: Secondary | ICD-10-CM

## 2020-11-22 ENCOUNTER — Other Ambulatory Visit: Payer: Self-pay

## 2020-11-23 ENCOUNTER — Telehealth: Payer: Self-pay | Admitting: Family

## 2020-11-23 ENCOUNTER — Other Ambulatory Visit: Payer: Self-pay

## 2020-11-23 DIAGNOSIS — J302 Other seasonal allergic rhinitis: Secondary | ICD-10-CM

## 2020-11-23 DIAGNOSIS — I10 Essential (primary) hypertension: Secondary | ICD-10-CM

## 2020-11-23 DIAGNOSIS — M7731 Calcaneal spur, right foot: Secondary | ICD-10-CM

## 2020-11-23 DIAGNOSIS — M7732 Calcaneal spur, left foot: Secondary | ICD-10-CM

## 2020-11-23 DIAGNOSIS — F32A Depression, unspecified: Secondary | ICD-10-CM

## 2020-11-23 DIAGNOSIS — E7841 Elevated Lipoprotein(a): Secondary | ICD-10-CM

## 2020-11-23 DIAGNOSIS — M722 Plantar fascial fibromatosis: Secondary | ICD-10-CM

## 2020-11-23 MED ORDER — ATORVASTATIN CALCIUM 40 MG PO TABS: 40.0000 mg | ORAL_TABLET | Freq: Every day | ORAL | 0 refills | Status: DC

## 2020-11-23 MED ORDER — MELOXICAM 15 MG PO TABS: 15.0000 mg | ORAL_TABLET | Freq: Every day | ORAL | 3 refills | Status: AC

## 2020-11-23 MED ORDER — AMLODIPINE BESYLATE 10 MG PO TABS: 10.0000 mg | ORAL_TABLET | Freq: Every day | ORAL | 0 refills | Status: DC

## 2020-11-23 MED ORDER — ESCITALOPRAM OXALATE 10 MG PO TABS: 10.0000 mg | ORAL_TABLET | Freq: Every day | ORAL | 2 refills | Status: DC

## 2020-11-23 MED ORDER — NICOTINE 14 MG/24HR TD PT24
14.0000 mg | MEDICATED_PATCH | Freq: Every day | TRANSDERMAL | 1 refills | Status: DC
  Filled 2020-11-23: qty 14, 14d supply, fill #0

## 2020-11-23 MED ORDER — LEVOCETIRIZINE DIHYDROCHLORIDE 5 MG PO TABS: 5.0000 mg | ORAL_TABLET | Freq: Every day | ORAL | 0 refills | Status: AC

## 2020-11-23 MED ORDER — OMEPRAZOLE 10 MG PO CPDR
10.0000 mg | DELAYED_RELEASE_CAPSULE | Freq: Every day | ORAL | 0 refills | Status: DC
  Filled 2020-11-23: qty 30, 30d supply, fill #0

## 2020-11-23 NOTE — Telephone Encounter (Incomplete)
Pt states she has not had several of her medications for about 3 weeks now because of misunderstanding between insurance and past and desired pharmacies. Pt states [facility] does not work w/her Insurance underwriter and is requesting the following refills to be sent to [facility] on [address].    Medicine requested: amLODIPine Besylate (Tab) NORVASC 10 MG Take 1 tablet (10 mg total) by mouth daily.    Atorvastatin Calcium (Tab) LIPITOR 40 MG Take 1 tablet (40 mg total) by mouth daily.    Ergocalciferol (Cap) DRISDOL 1.25 MG (50000 UNIT) Take 1 capsule (50,000 Units total) by mouth every 7 (seven) days.    Escitalopram Oxalate (Tab) LEXAPRO 10 MG Take 1 tablet (10 mg total) by mouth daily.    Famotidine (Tab) PEPCID 20 MG Take 1 tablet (20 mg total) by mouth 2 (two) times daily.    Levocetirizine Dihydrochloride (Tab) XYZAL 5 MG Take 1 tablet (5 mg total) by mouth at bedtime.    Meloxicam (Tab) MOBIC 15 MG Take 1 tablet (15 mg total) by mouth daily.    Nicotine (Patch 24 hr) NICODERM CQ - dosed in mg/24 hr 7 mg/24hr Place 1 patch (7 mg total) onto the skin daily.   ***** Pt states she finished the 1st stage of the Nicotine Patch and is inquiring if she would now need the 2nd stage****  Please advise and thank you.

## 2020-11-23 NOTE — Progress Notes (Signed)
Amlodipine, atorvastatin, escitalopram, and meloxicam, xyzal resent to [place], need updated labs before Vit D can be refilled

## 2020-11-23 NOTE — Addendum Note (Signed)
Addended by: Camillia Herter on: 11/23/2020 01:49 PM   Modules accepted: Orders

## 2020-11-23 NOTE — Telephone Encounter (Signed)
Meds have been refilled and sent to correct pharmacy except Vit D(no recent labs) and Famotidine, Nicotine patch needs next dosage up can you refill

## 2020-11-29 ENCOUNTER — Other Ambulatory Visit: Payer: Self-pay

## 2020-11-30 ENCOUNTER — Other Ambulatory Visit: Payer: Self-pay

## 2020-12-04 NOTE — Progress Notes (Signed)
Patient ID: AKHIA CARRAS, female    DOB: 07/20/1976  MRN: [id number]  CC: Hypertension Follow-Up  Subjective: Dhriti Solomons is a 45 y.o. female who presents for hypertension follow-up. Her concerns today include:   1. HYPERTENSION FOLLOW-UP: 11/15/2020:  - Level of blood pressure control unknown as patient does not monitor at home.  - Continue Amlodipine as prescribed.  - Counseled that next visit should be an office visit, if possible, for medication refills. Today's visit and previous visit have been telemedicine and would like to evaluate patient in-person as well as update blood work. Patient agreeable.    12/06/2020: Currently taking: see medication list Med Adherence: [x]  Yes    []  No Medication side effects: []  Yes    [x]  No Adherence with salt restriction (low-salt diet): Making improvements Exercise: No Home Monitoring?: [x]  Yes   []  No Monitoring Frequency: []  Yes    [x]  No Home BP results range: []  Yes    [x]  No Smoking: 1 pack daily   SOB? []  Yes    [x]  No Chest Pain?: []  Yes    [x]  No Leg swelling?: []  Yes    [x]  No Headaches?: []  Yes    [x]  No Dizziness? []  Yes    [x]  No  2. KNEE PAIN: Duration: months Involved knee: right Mechanism of injury: unknown Location:diffuse Onset: gradual Severity: 9/10  Frequency: intermittent Radiation: hip  Aggravating factors: walking   Alleviating factors:  Status: worse Treatments attempted: aleve  Relief with NSAIDs?:  mild Weakness with weight bearing or walking: yes Sensation of giving way: yes Locking: no Popping: yes Bruising: no Swelling: no Redness: no Paresthesias/decreased sensation: no Fevers: no  Comments: Reports falling several times at work and did not receive medical treatment at that time.   3.GERD: GERD control status: controlled Satisfied with current treatment? yes Heartburn frequency: sometimes Medication side effects: no  Medication compliance: stable Alleviatiating factors:  Omeprazole Aggravating factors: spicy foods Dysphagia: no Odynophagia:  no Hematemesis: no Blood in stool: no   4. BODY ACHES: Reports runny nose began 7 days ago. The next day felt worse with diarrhea, coughing, body aches, and no energy. Took a Covid test 6 days ago at [facility] and resulted negative 2 days later. She is eating and drinking as normal. Works at a [employer] and endorses that she does not wear a mask at work.   5. VITAMIN D SCREENING: Would like repeat lab.  6. SMOKING CESSATION: Requesting refills of nicotine patch.   Patient Active Problem List   Diagnosis Date Noted  . Fecal incontinence 03/17/2020  . Diarrhea 03/17/2020  . Status post surgery 01/19/2020  . Ruptured cyst of left ovary 01/19/2020  . Hemorrhagic cyst of left ovary 01/18/2020  . 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 S/P TAH-BSO 06/26/2013     Current Outpatient Medications on File Prior to Visit  Medication Sig Dispense Refill  . atorvastatin (LIPITOR) 40 MG tablet Take 1 tablet (40 mg total) by mouth daily. 90 tablet 0  . escitalopram (LEXAPRO) 10 MG tablet Take 1 tablet (10 mg total) by mouth daily. 30 tablet 2  . levocetirizine (XYZAL) 5 MG tablet Take 1 tablet (5 mg total) by mouth at bedtime. 30 tablet 0  . meloxicam (MOBIC) 15 MG tablet Take 1 tablet (15 mg total) by mouth daily. 30 tablet 3  . Vitamin D, Ergocalciferol, (DRISDOL) 1.25 MG (50000 UNIT) CAPS capsule Take 1 capsule (50,000 Units total) by mouth every 7 (seven) days. (  Patient not taking: No sig reported) 12 capsule 1   No current facility-administered medications on file prior to visit.    No Known Allergies  Social History   Socioeconomic History  . Marital status: Married    Spouse name: Not on file  . Number of children: 3  . Years of education: Not on file  . Highest education level: Not on file  Occupational History  . Occupation: Administrator, arts  Tobacco Use  . Smoking status: Current Every Day Smoker     Packs/day: 1.00    Types: Cigarettes  . Smokeless tobacco: Never Used  Vaping Use  . Vaping Use: Never used  Substance and Sexual Activity  . Alcohol use: Yes    Comment: occasional  . Drug use: No  . Sexual activity: Yes    Partners: Female    Birth control/protection: Surgical    Comment: monogamous, 4 years, no toys  Other Topics Concern  . Not on file  Social History Narrative  . Not on file   Social Determinants of Health   Financial Resource Strain: Not on file  Food Insecurity: Food Insecurity Present  . Worried About Charity fundraiser in the Last Year: Often true  . Ran Out of Food in the Last Year: Often true  Transportation Needs: No Transportation Needs  . Lack of Transportation (Medical): No  . Lack of Transportation (Non-Medical): No  Physical Activity: Not on file  Stress: Not on file  Social Connections: Not on file  Intimate Partner Violence: Not on file    Family History  Problem Relation Age of Onset  . Hypertension Maternal Grandfather   . Colon cancer Maternal Grandfather   . Prostate cancer Maternal Grandfather   . Hypertension Mother   . Sickle cell anemia Maternal Grandmother   . Hypertension Maternal Grandmother   . Heart disease Maternal Grandmother   . Breast cancer Other        maternal great aunt  . Brain cancer Other        "                "       "  . HIV Brother   . Lupus Daughter     Past Surgical History:  Procedure Laterality Date  . ABDOMINAL HYSTERECTOMY N/A 06/24/2013   Procedure: HYSTERECTOMY ABDOMINAL;  Surgeon: Betsy Coder, MD;  Location: [facility];  Service: Gynecology;  Laterality: N/A;  . BILATERAL SALPINGECTOMY Bilateral 06/24/2013   Procedure: BILATERAL SALPINGECTOMY;  Surgeon: Betsy Coder, MD;  Location: [facility];  Service: Gynecology;  Laterality: Bilateral;  . CYSTOSCOPY N/A 06/24/2013   Procedure: CYSTOSCOPY;  Surgeon: Betsy Coder, MD;  Location: [facility];  Service: Gynecology;  Laterality: N/A;  .  DILATION AND CURETTAGE OF UTERUS N/A 06/24/2013   Procedure: DILATATION AND CURETTAGE;  Surgeon: Betsy Coder, MD;  Location: [facility];  Service: Gynecology;  Laterality: N/A;  . GANGLION CYST EXCISION Left   . LAPAROSCOPY N/A 01/18/2020   Procedure: LAPAROSCOPY DIAGNOSTIC, WITH EVACTION OF HEMOPERITONEUM;  Surgeon: Sloan Leiter, MD;  Location: [facility];  Service: Gynecology;  Laterality: N/A;  . TUBAL LIGATION      ROS: Review of Systems Negative except as stated above  PHYSICAL EXAM: BP 134/85 (BP Location: Left Arm, Patient Position: Sitting)   Pulse 89   Ht 5' 4.02" (1.626 m)   Wt 238 lb 9.6 oz (108.2 kg)   LMP 05/19/2013 Comment: Negative HCG in  ED today  SpO2 97%   BMI 40.94 kg/m   Physical Exam HENT:     Head: Normocephalic and atraumatic.  Eyes:     Extraocular Movements: Extraocular movements intact.     Conjunctiva/sclera: Conjunctivae normal.     Pupils: Pupils are equal, round, and reactive to light.  Cardiovascular:     Rate and Rhythm: Normal rate and regular rhythm.     Pulses: Normal pulses.     Heart sounds: Normal heart sounds.  Pulmonary:     Effort: Pulmonary effort is normal.     Breath sounds: Normal breath sounds.  Musculoskeletal:     Cervical back: Normal range of motion and neck supple.  Neurological:     General: No focal deficit present.     Mental Status: She is alert and oriented to person, place, and time.  Psychiatric:        Mood and Affect: Mood normal.        Behavior: Behavior normal.     ASSESSMENT AND PLAN: 1. Essential hypertension: - Blood pressure at goal during today's visit. Level of blood pressure control unknown as patient not consistently monitoring at home.  - Continue Amlodipine as prescribed.  - Counseled on blood pressure goal of less than 130/80, low-sodium, DASH diet, medication compliance, 150 minutes of moderate intensity exercise per week as tolerated. Discussed medication compliance, adverse effects. - BMP to  evaluate kidney function and electrolyte balance. - Follow-up with primary provider in 3 months or sooner if needed.  - Basic Metabolic Panel - amLODipine (NORVASC) 10 MG tablet; Take 1 tablet (10 mg total) by mouth daily.  Dispense: 90 tablet; Refill: 0  2. Chronic pain of right knee: - Continue Meloxicam as prescribed.  - Follow-up with primary provider as scheduled.   3. Gastroesophageal reflux disease, unspecified whether esophagitis present: - Continue Omeprazole as prescribed.  You may feel better if you: ?Lose weight (if you are overweight) ?Avoid foods that make your symptoms worse - For some people these include coffee, chocolate, alcohol, peppermint, and fatty foods. ?Stop smoking, if you smoke ?Avoid late meals - Lying down with a full stomach can make reflux worse. Try to plan meals for at least 2 to 3 hours before bedtime. ?Avoid tight clothing - Some people feel better if they wear comfortable clothing that does not squeeze the stomach area. - Follow-up with primary provider as scheduled.  - omeprazole (PRILOSEC) 10 MG capsule; Take 1 capsule (10 mg total) by mouth daily.  Dispense: 90 capsule; Refill: 0  4. Body aches: 5. Diarrhea, unspecified type: - Respiratory panel for screening.  - COVID-19, Flu A+B and RSV  6. Encounter for vitamin deficiency screening: - Screening for deficiency. - Vitamin D, 25-hydroxy  7. Tobacco dependence: - Continue nicotine patch as prescribed.  - Follow-up with primary provider as scheduled.  - nicotine (NICODERM CQ - DOSED IN MG/24 HOURS) 14 mg/24hr patch; Place 1 patch (14 mg total) onto the skin daily.  Dispense: 14 patch; Refill: 4   Patient was given the opportunity to ask questions.  Patient verbalized understanding of the plan and was able to repeat key elements of the plan. Patient was given clear instructions to go to Emergency Department or return to medical center if symptoms don't improve, worsen, or new problems develop.The  patient verbalized understanding.   Orders Placed This Encounter  Procedures  . COVID-19, Flu A+B and RSV  . Vitamin D, 25-hydroxy  . Basic Metabolic Panel  Requested Prescriptions   Signed Prescriptions Disp Refills  . nicotine (NICODERM CQ - DOSED IN MG/24 HOURS) 14 mg/24hr patch 14 patch 4    Sig: Place 1 patch (14 mg total) onto the skin daily.  Marland Kitchen omeprazole (PRILOSEC) 10 MG capsule 90 capsule 0    Sig: Take 1 capsule (10 mg total) by mouth daily.  Marland Kitchen amLODipine (NORVASC) 10 MG tablet 90 tablet 0    Sig: Take 1 tablet (10 mg total) by mouth daily.  . Cholecalciferol (VITAMIN D3) 25 MCG (1000 UT) CAPS 90 capsule 0    Sig: Take 1 capsule (1,000 Units total) by mouth daily.   Follow-up with primary provider in 3 months or sooner if needed.   Camillia Herter, NP

## 2020-12-06 ENCOUNTER — Ambulatory Visit (INDEPENDENT_AMBULATORY_CARE_PROVIDER_SITE_OTHER): Payer: 59 | Admitting: Family

## 2020-12-06 ENCOUNTER — Other Ambulatory Visit: Payer: Self-pay

## 2020-12-06 ENCOUNTER — Encounter: Payer: Self-pay | Admitting: Family

## 2020-12-06 VITALS — BP 134/85 | HR 89 | Ht 64.02 in | Wt 238.6 lb

## 2020-12-06 DIAGNOSIS — I10 Essential (primary) hypertension: Secondary | ICD-10-CM | POA: Diagnosis not present

## 2020-12-06 DIAGNOSIS — G8929 Other chronic pain: Secondary | ICD-10-CM

## 2020-12-06 DIAGNOSIS — R197 Diarrhea, unspecified: Secondary | ICD-10-CM

## 2020-12-06 DIAGNOSIS — R52 Pain, unspecified: Secondary | ICD-10-CM | POA: Diagnosis not present

## 2020-12-06 DIAGNOSIS — K219 Gastro-esophageal reflux disease without esophagitis: Secondary | ICD-10-CM | POA: Diagnosis not present

## 2020-12-06 DIAGNOSIS — F17200 Nicotine dependence, unspecified, uncomplicated: Secondary | ICD-10-CM

## 2020-12-06 DIAGNOSIS — Z1321 Encounter for screening for nutritional disorder: Secondary | ICD-10-CM

## 2020-12-06 DIAGNOSIS — M25561 Pain in right knee: Secondary | ICD-10-CM | POA: Diagnosis not present

## 2020-12-06 DIAGNOSIS — E559 Vitamin D deficiency, unspecified: Secondary | ICD-10-CM

## 2020-12-06 MED ORDER — OMEPRAZOLE 10 MG PO CPDR
10.0000 mg | DELAYED_RELEASE_CAPSULE | Freq: Every day | ORAL | 0 refills | Status: DC
Start: 2020-12-06 — End: 2021-03-17

## 2020-12-06 MED ORDER — NICOTINE 14 MG/24HR TD PT24: 14.0000 mg | MEDICATED_PATCH | Freq: Every day | TRANSDERMAL | 4 refills | Status: AC

## 2020-12-06 NOTE — Progress Notes (Signed)
HTN f/u Experiencing body aches, pains diarrhea,  Covid test-negative Right knee pain for 1 month   Needs nicotine patch sent into [facility]

## 2020-12-07 LAB — BASIC METABOLIC PANEL
BUN/Creatinine Ratio: 17 (ref 9–23)
BUN: 14 mg/dL (ref 6–24)
CO2: 20 mmol/L (ref 20–29)
Calcium: 9.1 mg/dL (ref 8.7–10.2)
Chloride: 107 mmol/L — ABNORMAL HIGH (ref 96–106)
Creatinine, Ser: 0.84 mg/dL (ref 0.57–1.00)
Glucose: 91 mg/dL (ref 65–99)
Potassium: 4.1 mmol/L (ref 3.5–5.2)
Sodium: 142 mmol/L (ref 134–144)
eGFR: 88 mL/min/{1.73_m2} (ref >59)

## 2020-12-07 LAB — COVID-19, FLU A+B AND RSV
Influenza A, NAA: NOT DETECTED
Influenza B, NAA: NOT DETECTED
RSV, NAA: NOT DETECTED
SARS-CoV-2, NAA: NOT DETECTED

## 2020-12-07 LAB — VITAMIN D 25 HYDROXY (VIT D DEFICIENCY, FRACTURES): Vit D, 25-Hydroxy: 18.6 ng/mL — ABNORMAL LOW (ref 30.0–100.0)

## 2020-12-07 MED ORDER — AMLODIPINE BESYLATE 10 MG PO TABS: 10.0000 mg | ORAL_TABLET | Freq: Every day | ORAL | 0 refills | Status: DC

## 2020-12-07 MED ORDER — VITAMIN D3 25 MCG (1000 UT) PO CAPS: 1000.0000 [IU] | ORAL_CAPSULE | Freq: Every day | ORAL | 0 refills | Status: AC

## 2020-12-07 NOTE — Progress Notes (Signed)
Covid negative

## 2020-12-07 NOTE — Progress Notes (Signed)
Kidney function normal.   Vitamin D level low. A prescription for Cholecalciferol was sent to your pharmacy. Encouraged to have rechecked in 12 weeks.

## 2020-12-19 NOTE — Progress Notes (Signed)
Patient did not show for appointment.   

## 2020-12-20 ENCOUNTER — Encounter: Payer: PRIVATE HEALTH INSURANCE | Admitting: Family

## 2020-12-20 DIAGNOSIS — K219 Gastro-esophageal reflux disease without esophagitis: Secondary | ICD-10-CM

## 2020-12-20 DIAGNOSIS — F17200 Nicotine dependence, unspecified, uncomplicated: Secondary | ICD-10-CM

## 2020-12-24 ENCOUNTER — Ambulatory Visit: Payer: 59 | Admitting: Nurse Practitioner

## 2020-12-24 DIAGNOSIS — F32A Depression, unspecified: Secondary | ICD-10-CM | POA: Insufficient documentation

## 2021-01-03 ENCOUNTER — Telehealth: Payer: Self-pay | Admitting: Family

## 2021-01-03 NOTE — Telephone Encounter (Signed)
Pt called stating she needs refills on her medications  escitalopram (LEXAPRO) 10 MG tablet  amLODipine (NORVASC) 10 MG tablet  atorvastatin (LIPITOR) 40 MG tablet  meloxicam (MOBIC) 15 MG tablet   [facility] ([address]), [place] - [place]  [address] ([place]) [place] [address]  Phone:  [phone] Fax:  [phone]

## 2021-01-07 NOTE — Telephone Encounter (Signed)
Spoke to [facility], advised that all medications were filled and picked up by pt on 4/26, the Atorvastatin was not picked up because pharmacy had already put back by the time pt came to pick up other meds. Called pt and pt aware that Atorvastatin can be filled at pharmacy

## 2021-03-16 ENCOUNTER — Other Ambulatory Visit: Payer: Self-pay | Admitting: Family

## 2021-03-16 DIAGNOSIS — I10 Essential (primary) hypertension: Secondary | ICD-10-CM

## 2021-03-16 DIAGNOSIS — F32A Depression, unspecified: Secondary | ICD-10-CM

## 2021-03-16 DIAGNOSIS — K219 Gastro-esophageal reflux disease without esophagitis: Secondary | ICD-10-CM

## 2021-04-05 ENCOUNTER — Other Ambulatory Visit: Payer: Self-pay

## 2021-04-05 ENCOUNTER — Ambulatory Visit
Admission: EM | Admit: 2021-04-05 | Discharge: 2021-04-05 | Disposition: A | Discharge status: Home / Self Care | Payer: 59

## 2021-04-05 ENCOUNTER — Encounter (HOSPITAL_COMMUNITY): Payer: Self-pay

## 2021-04-05 ENCOUNTER — Emergency Department (HOSPITAL_COMMUNITY): Payer: 59

## 2021-04-05 ENCOUNTER — Emergency Department (HOSPITAL_COMMUNITY)
Admission: EM | Admit: 2021-04-05 | Discharge: 2021-04-06 | Disposition: A | Discharge status: Home / Self Care | Payer: 59 | Attending: Emergency Medicine | Admitting: Emergency Medicine

## 2021-04-05 DIAGNOSIS — J45909 Unspecified asthma, uncomplicated: Secondary | ICD-10-CM | POA: Diagnosis not present

## 2021-04-05 DIAGNOSIS — R5383 Other fatigue: Secondary | ICD-10-CM | POA: Insufficient documentation

## 2021-04-05 DIAGNOSIS — I10 Essential (primary) hypertension: Secondary | ICD-10-CM | POA: Insufficient documentation

## 2021-04-05 DIAGNOSIS — H538 Other visual disturbances: Secondary | ICD-10-CM | POA: Insufficient documentation

## 2021-04-05 DIAGNOSIS — Z79899 Other long term (current) drug therapy: Secondary | ICD-10-CM | POA: Insufficient documentation

## 2021-04-05 DIAGNOSIS — Z20822 Contact with and (suspected) exposure to covid-19: Secondary | ICD-10-CM | POA: Diagnosis not present

## 2021-04-05 DIAGNOSIS — R55 Syncope and collapse: Secondary | ICD-10-CM | POA: Insufficient documentation

## 2021-04-05 DIAGNOSIS — R002 Palpitations: Secondary | ICD-10-CM | POA: Diagnosis not present

## 2021-04-05 DIAGNOSIS — R519 Headache, unspecified: Secondary | ICD-10-CM | POA: Diagnosis present

## 2021-04-05 DIAGNOSIS — R42 Dizziness and giddiness: Secondary | ICD-10-CM

## 2021-04-05 DIAGNOSIS — H53149 Visual discomfort, unspecified: Secondary | ICD-10-CM | POA: Diagnosis not present

## 2021-04-05 DIAGNOSIS — F17210 Nicotine dependence, cigarettes, uncomplicated: Secondary | ICD-10-CM | POA: Insufficient documentation

## 2021-04-05 HISTORY — DX: Essential (primary) hypertension: I10

## 2021-04-05 LAB — CBC WITH DIFFERENTIAL/PLATELET
Abs Immature Granulocytes: 0.03 10*3/uL (ref 0.00–0.07)
Basophils Absolute: 0.1 10*3/uL (ref 0.0–0.1)
Basophils Relative: 1 %
Eosinophils Absolute: 0.2 10*3/uL (ref 0.0–0.5)
Eosinophils Relative: 2 %
HCT: 38.7 % (ref 36.0–46.0)
Hemoglobin: 12.9 g/dL (ref 12.0–15.0)
Immature Granulocytes: 0 %
Lymphocytes Relative: 39 %
Lymphs Abs: 3.8 10*3/uL (ref 0.7–4.0)
MCH: 26.9 pg (ref 26.0–34.0)
MCHC: 33.3 g/dL (ref 30.0–36.0)
MCV: 80.8 fL (ref 80.0–100.0)
Monocytes Absolute: 0.7 10*3/uL (ref 0.1–1.0)
Monocytes Relative: 8 %
Neutro Abs: 4.9 10*3/uL (ref 1.7–7.7)
Neutrophils Relative %: 50 %
Platelets: 291 10*3/uL (ref 150–400)
RBC: 4.79 MIL/uL (ref 3.87–5.11)
RDW: 14.2 % (ref 11.5–15.5)
WBC: 9.7 10*3/uL (ref 4.0–10.5)
nRBC: 0 % (ref 0.0–0.2)

## 2021-04-05 LAB — COMPREHENSIVE METABOLIC PANEL
ALT: 18 U/L (ref 0–44)
AST: 21 U/L (ref 15–41)
Albumin: 4.1 g/dL (ref 3.5–5.0)
Alkaline Phosphatase: 67 U/L (ref 38–126)
Anion gap: 8 (ref 5–15)
BUN: 14 mg/dL (ref 6–20)
CO2: 25 mmol/L (ref 22–32)
Calcium: 9.2 mg/dL (ref 8.9–10.3)
Chloride: 107 mmol/L (ref 98–111)
Creatinine, Ser: 0.94 mg/dL (ref 0.44–1.00)
GFR, Estimated: >60 mL/min (ref >60)
Glucose, Bld: 87 mg/dL (ref 70–99)
Potassium: 3.4 mmol/L — ABNORMAL LOW (ref 3.5–5.1)
Sodium: 140 mmol/L (ref 135–145)
Total Bilirubin: 0.7 mg/dL (ref 0.3–1.2)
Total Protein: 7.4 g/dL (ref 6.5–8.1)

## 2021-04-05 LAB — RESP PANEL BY RT-PCR (FLU A&B, COVID) ARPGX2
Influenza A by PCR: NEGATIVE
Influenza B by PCR: NEGATIVE
SARS Coronavirus 2 by RT PCR: NEGATIVE

## 2021-04-05 LAB — MAGNESIUM: Magnesium: 2 mg/dL (ref 1.7–2.4)

## 2021-04-05 MED ORDER — SODIUM CHLORIDE 0.9 % IV BOLUS
1000.0000 mL | Freq: Once | INTRAVENOUS | Status: AC
  Administered 2021-04-05: 1000 mL via INTRAVENOUS

## 2021-04-05 MED ORDER — METOCLOPRAMIDE HCL 5 MG/ML IJ SOLN
10.0000 mg | INTRAMUSCULAR | Status: AC
  Administered 2021-04-05: 10 mg via INTRAVENOUS
  Filled 2021-04-05: qty 2

## 2021-04-05 MED ORDER — POTASSIUM CHLORIDE CRYS ER 20 MEQ PO TBCR
40.0000 meq | EXTENDED_RELEASE_TABLET | Freq: Once | ORAL | Status: AC
  Administered 2021-04-05: 40 meq via ORAL
  Filled 2021-04-05: qty 2

## 2021-04-05 MED ORDER — DEXAMETHASONE 4 MG PO TABS
10.0000 mg | ORAL_TABLET | Freq: Once | ORAL | Status: AC
  Administered 2021-04-05: 10 mg via ORAL
  Filled 2021-04-05: qty 2

## 2021-04-05 NOTE — ED Provider Notes (Signed)
[facility]    CSN: [id number] Arrival date & time: 04/05/21  1404      History   Chief Complaint Chief Complaint  Patient presents with   Dizziness    HPI Anne Reese is a 45 y.o. female.   Patient presents to the urgent care today for dizziness, blurred vision, headaches, nausea without vomiting, fatigue, palpitations that have been present for the past few days. Denies any chest pain, shortness of breath, upper respiratory symptoms, known fevers, or sick contacts. Pertinent medical history includes hypertension where patient takes amlodipine daily. Has not missed any medications and there has been no recent changes to BP medications. Patient has not taken BP at home since symptoms started. Denies any abdominal pain, body aches, diarrhea, constipation.    Dizziness  Past Medical History:  Diagnosis Date   Asthma    no inhaler use x 15 yrs   Complication of anesthesia    asthma attack in PACU   Depression    Diverticulitis    Fibroids 2013   Hyperlipidemia    Hypertension     Patient Active Problem List   Diagnosis Date Noted   Depression 12/24/2020   Fecal incontinence 03/17/2020   Diarrhea 03/17/2020   Status post surgery 01/19/2020   Ruptured cyst of left ovary 01/19/2020   Hemorrhagic cyst of left ovary 01/18/2020   Acute diverticulitis 05/03/2019   Plantar fasciitis of right foot 06/25/2018   Elevated lipoprotein(a) 10/22/2017   S/P TAH-BSO 06/26/2013    Past Surgical History:  Procedure Laterality Date   ABDOMINAL HYSTERECTOMY N/A 06/24/2013   Procedure: HYSTERECTOMY ABDOMINAL;  Surgeon: Betsy Coder, MD;  Location: [facility];  Service: Gynecology;  Laterality: N/A;   BILATERAL SALPINGECTOMY Bilateral 06/24/2013   Procedure: BILATERAL SALPINGECTOMY;  Surgeon: Betsy Coder, MD;  Location: [facility];  Service: Gynecology;  Laterality: Bilateral;   CYSTOSCOPY N/A 06/24/2013   Procedure: CYSTOSCOPY;  Surgeon: Betsy Coder, MD;  Location:  [facility];  Service: Gynecology;  Laterality: N/A;   DILATION AND CURETTAGE OF UTERUS N/A 06/24/2013   Procedure: DILATATION AND CURETTAGE;  Surgeon: Betsy Coder, MD;  Location: [facility];  Service: Gynecology;  Laterality: N/A;   GANGLION CYST EXCISION Left    LAPAROSCOPY N/A 01/18/2020   Procedure: LAPAROSCOPY DIAGNOSTIC, WITH EVACTION OF HEMOPERITONEUM;  Surgeon: Sloan Leiter, MD;  Location: [facility];  Service: Gynecology;  Laterality: N/A;   TUBAL LIGATION      OB History     Gravida  3   Para      Term      Preterm      AB      Living  3      SAB      IAB      Ectopic      Multiple      Live Births               Home Medications    Prior to Admission medications   Medication Sig Start Date End Date Taking? Authorizing Provider  amLODipine (NORVASC) 10 MG tablet Take 1 tablet by mouth once daily 03/17/21   Camillia Herter, NP  atorvastatin (LIPITOR) 40 MG tablet Take 1 tablet (40 mg total) by mouth daily. 11/23/20 02/21/21  Camillia Herter, NP  escitalopram (LEXAPRO) 10 MG tablet Take 1 tablet by mouth once daily 03/17/21   Camillia Herter, NP  levocetirizine (XYZAL) 5 MG tablet Take 1 tablet (5 mg  total) by mouth at bedtime. 11/23/20   Camillia Herter, NP  omeprazole (PRILOSEC) 10 MG capsule Take 1 capsule by mouth once daily 03/17/21   Camillia Herter, NP  Vitamin D, Ergocalciferol, (DRISDOL) 1.25 MG (50000 UNIT) CAPS capsule Take 1 capsule (50,000 Units total) by mouth every 7 (seven) days. Patient not taking: No sig reported 12/22/19   Gildardo Pounds, NP    Family History Family History  Problem Relation Age of Onset   Hypertension Maternal Grandfather    Colon cancer Maternal Grandfather    Prostate cancer Maternal Grandfather    Hypertension Mother    Sickle cell anemia Maternal Grandmother    Hypertension Maternal Grandmother    Heart disease Maternal Grandmother    Breast cancer Other        maternal great aunt   Brain cancer Other        "                 "       "   HIV Brother    Lupus Daughter     Social History Social History   Tobacco Use   Smoking status: Every Day    Packs/day: 1.00    Types: Cigarettes   Smokeless tobacco: Never  Vaping Use   Vaping Use: Never used  Substance Use Topics   Alcohol use: Yes    Comment: occasional   Drug use: No     Allergies   Patient has no known allergies.   Review of Systems Review of Systems Per HPI  Physical Exam Triage Vital Signs ED Triage Vitals  Enc Vitals Group     BP 04/05/21 1431 (!) 142/94     Pulse Rate 04/05/21 1431 92     Resp 04/05/21 1431 18     Temp 04/05/21 1431 98.2 F (36.8 C)     Temp Source 04/05/21 1431 Oral     SpO2 04/05/21 1431 98 %     Weight --      Height --      Head Circumference --      Peak Flow --      Pain Score 04/05/21 1432 0     Pain Loc --      Pain Edu? --      Excl. in [place]? --    No data found.  Updated Vital Signs BP (!) 142/94 (BP Location: Right Arm)   Pulse 92   Temp 98.2 F (36.8 C) (Oral)   Resp 18   LMP 05/19/2013 Comment: Negative HCG in ED today  SpO2 98%   Visual Acuity Right Eye Distance:   Left Eye Distance:   Bilateral Distance:    Right Eye Near:   Left Eye Near:    Bilateral Near:     Physical Exam Constitutional:      Appearance: Normal appearance.  HENT:     Head: Normocephalic and atraumatic.  Eyes:     Extraocular Movements: Extraocular movements intact.     Conjunctiva/sclera: Conjunctivae normal.  Cardiovascular:     Rate and Rhythm: Normal rate and regular rhythm.     Pulses: Normal pulses.     Heart sounds: Normal heart sounds.  Pulmonary:     Effort: Pulmonary effort is normal.     Breath sounds: Normal breath sounds.  Abdominal:     General: Abdomen is flat. Bowel sounds are normal. There is no distension.     Palpations: Abdomen is soft.  Tenderness: There is no abdominal tenderness.  Skin:    General: Skin is warm and dry.  Neurological:     General: No focal  deficit present.     Mental Status: She is alert and oriented to person, place, and time. Mental status is at baseline.     Cranial Nerves: Cranial nerves are intact.     Sensory: Sensation is intact.     Motor: Motor function is intact.     Coordination: Coordination is intact.     Gait: Gait is intact.  Psychiatric:        Mood and Affect: Mood normal.        Behavior: Behavior normal.        Thought Content: Thought content normal.        Judgment: Judgment normal.     UC Treatments / Results  Labs (all labs ordered are listed, but only abnormal results are displayed) Labs Reviewed - No data to display  EKG   Radiology DG Chest 2 View  Result Date: 04/05/2021 CLINICAL DATA:  Shortness of breath, blurred vision, nausea, headache EXAM: CHEST - 2 VIEW COMPARISON:  01/19/2020 FINDINGS: Normal heart size, mediastinal contours, and pulmonary vascularity. Lungs clear. No pleural effusion or pneumothorax. Bones unremarkable. IMPRESSION: Normal exam. Electronically Signed   By: Lavonia Dana M.D.   On: 04/05/2021 17:36    Procedures Procedures (including critical care time)  Medications Ordered in UC Medications - No data to display  Initial Impression / Assessment and Plan / UC Course  I have reviewed the triage vital signs and the nursing notes.  Pertinent labs & imaging results that were available during my care of the patient were reviewed by me and considered in my medical decision making (see chart for details).     EKG showing sinus rhythm with prolonged QT. No prior EKG's present for comparison and patient is not sure of results of any prior EKG's. Advised patient that they need to go to the hospital for further evaluation and management. Patient was agreeable with plan. Vital signs stable at discharge. Agree  with patient self transport to the hospital. Final Clinical Impressions(s) / UC Diagnoses   Final diagnoses:  Palpitations  Dizziness and giddiness  Blurred  vision     Discharge Instructions      Please go to the hospital as soon as you leave urgent care for further evaluation and management.      ED Prescriptions   None    PDMP not reviewed this encounter.   Odis Luster, [place] 04/05/21 2051

## 2021-04-05 NOTE — Discharge Instructions (Addendum)
Please go to the hospital as soon as you leave urgent care for further evaluation and management. 

## 2021-04-05 NOTE — ED Provider Notes (Signed)
Emergency Medicine Provider Triage Evaluation Note  Anne Reese , a 45 y.o. female  was evaluated in triage.  Pt complains of gnawing headache, blurred vision, shortness of breath with exertion, nausea for the last 2 days.  Patient reports that she has dealt with vision changes before, when she had high blood pressure, however it was resolved when she was put on medication.  She describes the vision changes as blurry, hazy, unfocused.  She also reports that she has had some dizziness and lightheadedness recently.  She reports her nausea comes and goes, and has decreased her appetite slightly.  Patient denies chest pain, dysuria, recent sick contacts.  She is a smoker, and is not a diabetic.  She reports she went to be evaluated urgent care earlier today and they noticed a prolonged QT on her EKG and so they sent her here.  Review of Systems  Positive: Headache, dizziness, blurred vision, shortness of breath, nausea Negative: Chest pain, dysuria, abdominal pain, emesis  Physical Exam  BP 118/89 (BP Location: Left Arm)   Pulse 96   Temp 100 F (37.8 C) (Oral)   Resp 16   Ht '5\' 3"'$  (1.6 m)   Wt 100.7 kg   LMP 05/19/2013 Comment: Negative HCG in ED today  SpO2 100%   BMI 39.33 kg/m  Gen:   Awake, no distress  Resp:  Normal effort, no respiratory distress MSK:   Moves extremities without difficulty, strength grossly intact in all 4 extremities Other:  cranial nerves III through XII intact grossly.  He was are equal and reactive to light.  Medical Decision Making  Medically screening exam initiated at 5:14 PM.  Appropriate orders placed.  ELDRED LUFF was informed that the remainder of the evaluation will be completed by another provider, this initial triage assessment does not replace that evaluation, and the importance of remaining in the ED until their evaluation is complete.  Headache, blurred vision, shortness of breath, nausea, new EKG finding   Anselmo Pickler, PA-C 04/05/21  [place], [place], DO 04/07/21 [phone]

## 2021-04-05 NOTE — ED Triage Notes (Signed)
Pt c/o lightheadedness and dizziness, nausea, palpitations, headache, blurry vision, fatigue. States they felt like this prior to initiating HTN treatment, it resolved once treated for HTN, and although they have been taking their medication the symptoms are occurring. States they did take HTN medication yesterday and today. Denies body aches and chills.

## 2021-04-05 NOTE — ED Triage Notes (Signed)
Patient reports that she was having blurred vision, nausea, headache, and dizziness that started yesterday and worse today. Patient then went to [facility] and was told she had an irregular heart rhythm and to go to the ED

## 2021-04-05 NOTE — ED Provider Notes (Signed)
[facility] DEPT Provider Note   CSN: [id number] Arrival date & time: 04/05/21  1621     History Chief Complaint  Patient presents with   Headache   Blurred Vision   Near Syncope   irregular heart rhythm    Anne Reese is a 45 y.o. female.  45 year old female with a history of hyperlipidemia, hypertension, depression and asthma presents to the emergency department for evaluation of headache and fatigue.  Her fatigue is characterized as "lack of energy".  Also experiencing a constant, "pounding" headache in her forehead, temples, top of her head.  She has had headaches in the past which usually resolve without intervention.  She has not taken any over-the-counter medications for her headache pain.  Does note some nausea as well as photophobia.  No phonophobia or fevers, vomiting.  The patient does report experiencing palpitations at times which are fleeting.  She characterizes these as her heart rate beating faster than normal.  She has remained compliant with her daily prescribed medications as well as her antihypertensives.  Denies sick contacts, congestion, sore throat, syncope, chest pain, dysuria.  Sent from Urgent Care for QTc of 486.  The history is provided by the patient. No language interpreter was used.  Headache Associated symptoms: near-syncope   Near Syncope Associated symptoms include headaches.      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   S/P TAH-BSO 06/26/2013    Past Surgical History:  Procedure Laterality Date   ABDOMINAL HYSTERECTOMY N/A 06/24/2013   Procedure: HYSTERECTOMY ABDOMINAL;  Surgeon: Betsy Coder, MD;  Location: [facility];  Service: Gynecology;  Laterality: N/A;   BILATERAL SALPINGECTOMY Bilateral 06/24/2013   Procedure: BILATERAL SALPINGECTOMY;  Surgeon: Betsy Coder, MD;  Location: [facility];  Service: Gynecology;  Laterality: Bilateral;   CYSTOSCOPY N/A 06/24/2013   Procedure: CYSTOSCOPY;  Surgeon: Betsy Coder, MD;  Location: [facility];  Service: Gynecology;  Laterality: N/A;   DILATION AND CURETTAGE OF UTERUS N/A 06/24/2013   Procedure: DILATATION AND CURETTAGE;  Surgeon: Betsy Coder, MD;  Location: [facility];  Service: Gynecology;  Laterality: N/A;   GANGLION CYST EXCISION Left    LAPAROSCOPY N/A 01/18/2020   Procedure: LAPAROSCOPY DIAGNOSTIC, WITH EVACTION OF HEMOPERITONEUM;  Surgeon: Sloan Leiter, MD;  Location: [facility];  Service: Gynecology;  Laterality: N/A;   TUBAL LIGATION       OB History     Gravida  3   Para      Term      Preterm      AB      Living  3      SAB      IAB      Ectopic      Multiple      Live Births  Family History  Problem Relation Age of Onset   Hypertension Maternal Grandfather    Colon cancer Maternal Grandfather    Prostate cancer Maternal Grandfather    Hypertension Mother    Sickle cell anemia Maternal Grandmother    Hypertension Maternal Grandmother    Heart disease Maternal Grandmother    Breast cancer Other        maternal great aunt   Brain cancer Other        "                "       "   HIV Brother    Lupus Daughter     Social History   Tobacco Use   Smoking status: Every Day    Packs/day: 1.00    Types: Cigarettes   Smokeless tobacco: Never  Vaping Use   Vaping Use: Never used  Substance Use Topics   Alcohol use: Yes    Comment: occasional   Drug use:  No    Home Medications Prior to Admission medications   Medication Sig Start Date End Date Taking? Authorizing Provider  amLODipine (NORVASC) 10 MG tablet Take 1 tablet by mouth once daily 03/17/21   Camillia Herter, NP  atorvastatin (LIPITOR) 40 MG tablet Take 1 tablet (40 mg total) by mouth daily. 11/23/20 02/21/21  Camillia Herter, NP  escitalopram (LEXAPRO) 10 MG tablet Take 1 tablet by mouth once daily 03/17/21   Camillia Herter, NP  levocetirizine (XYZAL) 5 MG tablet Take 1 tablet (5 mg total) by mouth at bedtime. 11/23/20   Camillia Herter, NP  omeprazole (PRILOSEC) 10 MG capsule Take 1 capsule by mouth once daily 03/17/21   Camillia Herter, NP  Vitamin D, Ergocalciferol, (DRISDOL) 1.25 MG (50000 UNIT) CAPS capsule Take 1 capsule (50,000 Units total) by mouth every 7 (seven) days. Patient not taking: No sig reported 12/22/19   Gildardo Pounds, NP    Allergies    Patient has no known allergies.  Review of Systems   Review of Systems  Cardiovascular:  Positive for near-syncope.  Neurological:  Positive for headaches.  Ten systems reviewed and are negative for acute change, except as noted in the HPI.    Physical Exam Updated Vital Signs BP 123/79   Pulse 85   Temp 99.2 F (37.3 C) (Oral)   Resp 20   Ht '5\' 3"'$  (1.6 m)   Wt 100.7 kg   LMP 05/19/2013 Comment: Negative HCG in ED today  SpO2 97%   BMI 39.33 kg/m   Physical Exam Vitals and nursing note reviewed.  Constitutional:      General: She is not in acute distress.    Appearance: She is well-developed. She is not diaphoretic.     Comments: Nontoxic appearing and in NAD  HENT:     Head: Normocephalic and atraumatic.     Right Ear: External ear normal.     Left Ear: External ear normal.     Mouth/Throat:     Mouth: Mucous membranes are moist.  Eyes:     General: No scleral icterus.    Extraocular Movements: Extraocular movements intact.     Conjunctiva/sclera: Conjunctivae normal.     Pupils: Pupils are equal, round,  and reactive to light.  Neck:     Comments: No nuchal rigidity or meningismus  Pulmonary:     Effort: Pulmonary effort is normal. No respiratory distress.     Comments: Respirations even and unlabored  Musculoskeletal:        General: Normal range of motion.     Cervical back: Normal range of motion.  Skin:    General: Skin is warm and dry.     Coloration: Skin is not pale.     Findings: No erythema or rash.  Neurological:     Mental Status: She is alert and oriented to person, place, and time.     Coordination: Coordination normal.     Comments: GCS 15. Speech is goal oriented. No cranial nerve deficits appreciated; symmetric eyebrow raise, no facial drooping, tongue midline. Patient has equal grip strength bilaterally with 5/5 strength against resistance in all major muscle groups bilaterally. Sensation to light touch intact. Patient moves extremities without ataxia. No pronator drift.  Psychiatric:        Behavior: Behavior normal.    ED Results / Procedures / Treatments   Labs (all labs ordered are listed, but only abnormal results are displayed) Labs Reviewed  COMPREHENSIVE METABOLIC PANEL - Abnormal; Notable for the following components:      Result Value   Potassium 3.4 (*)    All other components within normal limits  RESP PANEL BY RT-PCR (FLU A&B, COVID) ARPGX2  CBC WITH DIFFERENTIAL/PLATELET  MAGNESIUM  SEDIMENTATION RATE    EKG None  Radiology DG Chest 2 View  Result Date: 04/05/2021 CLINICAL DATA:  Shortness of breath, blurred vision, nausea, headache EXAM: CHEST - 2 VIEW COMPARISON:  01/19/2020 FINDINGS: Normal heart size, mediastinal contours, and pulmonary vascularity. Lungs clear. No pleural effusion or pneumothorax. Bones unremarkable. IMPRESSION: Normal exam. Electronically Signed   By: Lavonia Dana M.D.   On: 04/05/2021 17:36    Procedures Procedures   Medications Ordered in ED Medications  sodium chloride 0.9 % bolus 1,000 mL (0 mLs Intravenous  Stopped 04/06/21 0010)  metoCLOPramide (REGLAN) injection 10 mg (10 mg Intravenous Given 04/05/21 2312)  dexamethasone (DECADRON) tablet 10 mg (10 mg Oral Given 04/05/21 2311)  potassium chloride SA (KLOR-CON) CR tablet 40 mEq (40 mEq Oral Given 04/05/21 2311)    ED Course  I have reviewed the triage vital signs and the nursing notes.  Pertinent labs & imaging results that were available during my care of the patient were reviewed by me and considered in my medical decision making (see chart for details).  Clinical Course as of 04/06/21 0514  Wed Apr 06, 2021  0047 Patient states that she feels much better and is back to baseline.  Her blood pressure has normalized.  Her repeat EKG in the emergency department with QTC at the high end of normal.  Do not feel further intervention is required.  She has no significant electrolyte derangements.  Sedimentation rate reassuring ruling out temporal arteritis.  Patient comfortable with follow-up with her primary care doctor. [KH]    Clinical Course User Index [KH] Antonietta Breach, PA-C   MDM Rules/Calculators/A&P                           Patient presents to the emergency department for evaluation of headache which began 2 days ago.  Patient with no history of recent head injury or trauma.  No fever, nuchal rigidity, meningismus to suggest meningitis.  Neurologic exam today is nonfocal.    Sent by urgent care given some mild QTC prolongation with reported history of sporadic palpitations.  The patient has no palpitations at present and her QTC on repeat EKG in the emergency department is  reassuring.  She has no electrolyte derangements.  Other labs have been reassuring as well.  Patient treated supportively with IV fluids, Reglan, Decadron.  On reassessment, the patient has had significant improvement in headache symptoms following a migraine cocktail.  I do not believe further emergent workup is indicated at this time.  Return precautions discussed and  provided.  Patient discharged in stable condition with no unaddressed concerns.   Final Clinical Impression(s) / ED Diagnoses Final diagnoses:  Frontal headache  Palpitations  Fatigue, unspecified type    Rx / DC Orders ED Discharge Orders     None        Antonietta Breach, PA-C 04/06/21 0516    Maudie Flakes, MD 04/06/21 [phone]

## 2021-04-06 LAB — SEDIMENTATION RATE: Sed Rate: 5 mm/hr (ref 0–22)

## 2021-04-06 NOTE — Discharge Instructions (Addendum)
Your evaluation in the emergency department has been reassuring.  We recommend follow-up with your primary care doctor.  Return for new or concerning symptoms.

## 2021-04-07 NOTE — Progress Notes (Signed)
Patient ID: Anne Reese, female    DOB: 1976/01/25  MRN: [id number]  CC: Headache Follow-Up    Subjective: Anne Reese is a 45 y.o. female who presents for headache follow-up.    HEADACHES FOLLOW-UP: Visit 04/05/2021 - 04/06/2021 at [facility] Emergency Department per MD note: Patient presents to the emergency department for evaluation of headache which began 2 days ago.  Patient with no history of recent head injury or trauma.  No fever, nuchal rigidity, meningismus to suggest meningitis.  Neurologic exam today is nonfocal.     Sent by urgent care given some mild QTC prolongation with reported history of sporadic palpitations.  The patient has no palpitations at present and her QTC on repeat EKG in the emergency department is reassuring.  She has no electrolyte derangements.  Other labs have been reassuring as well.  Patient treated supportively with IV fluids, Reglan, Decadron.   On reassessment, the patient has had significant improvement in headache symptoms following a migraine cocktail.  I do not believe further emergent workup is indicated at this time.  Return precautions discussed and provided.  Patient discharged in stable condition with no unaddressed concerns.  04/08/2021: Reports since hospital discharge still having headaches consistently. Mostly located at the front of head. Taking Ibuprofen 800 mg helps some. Notices change in vision especially while at work. Trouble seeing receipts and computer screen. 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 Sleeping well at night. Home blood pressures have been normal. Having intermittent chest pain, palpitations, and chest pressure. Reports never said anything much about it before because she thought it was normal because she has high blood pressure. She is concerned because of family history of heart disease.    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 90 tablet 0   escitalopram (LEXAPRO) 10 MG tablet Take 1 tablet by mouth once daily 30 tablet 0   levocetirizine (XYZAL) 5 MG tablet Take 1 tablet (5 mg total) by mouth at bedtime. 30 tablet 0   omeprazole (PRILOSEC) 10 MG capsule Take 1 capsule by mouth once daily 90 capsule 0   Vitamin D, Ergocalciferol, (DRISDOL) 1.25 MG (50000 UNIT) CAPS capsule Take 1 capsule (50,000 Units total) by mouth every 7 (seven) days. (Patient not taking: No sig reported) 12 capsule 1   No current facility-administered medications on file prior to visit.    No Known Allergies  Social History   Socioeconomic History   Marital status: Married    Spouse name: Not on file   Number of children: 3   Years of education: Not on file   Highest education level: Not on file  Occupational History   Occupation: Administrator, arts  Tobacco Use   Smoking status: Every Day    Packs/day: 1.00    Types: Cigarettes  Smokeless tobacco: Never  Vaping Use   Vaping Use: Never used  Substance and Sexual Activity   Alcohol use: Yes    Comment: occasional   Drug use: No   Sexual activity: Yes    Partners: Female    Birth control/protection: Surgical    Comment: monogamous, 4 years, no toys  Other Topics Concern   Not on file  Social History Narrative   Not on file   Social Determinants of Health   Financial Resource Strain: Not on file  Food Insecurity: Not  on file  Transportation Needs: Not on file  Physical Activity: Not on file  Stress: Not on file  Social Connections: Not on file  Intimate Partner Violence: Not on file    Family History  Problem Relation Age of Onset   Hypertension Maternal Grandfather    Colon cancer Maternal Grandfather    Prostate cancer Maternal Grandfather    Hypertension Mother    Sickle cell anemia Maternal Grandmother    Hypertension Maternal Grandmother    Heart disease Maternal Grandmother    Breast cancer Other        maternal great aunt   Brain cancer Other        "                "       "   HIV Brother    Lupus Daughter     Past Surgical History:  Procedure Laterality Date   ABDOMINAL HYSTERECTOMY N/A 06/24/2013   Procedure: HYSTERECTOMY ABDOMINAL;  Surgeon: Betsy Coder, MD;  Location: [facility];  Service: Gynecology;  Laterality: N/A;   BILATERAL SALPINGECTOMY Bilateral 06/24/2013   Procedure: BILATERAL SALPINGECTOMY;  Surgeon: Betsy Coder, MD;  Location: [facility];  Service: Gynecology;  Laterality: Bilateral;   CYSTOSCOPY N/A 06/24/2013   Procedure: CYSTOSCOPY;  Surgeon: Betsy Coder, MD;  Location: [facility];  Service: Gynecology;  Laterality: N/A;   DILATION AND CURETTAGE OF UTERUS N/A 06/24/2013   Procedure: DILATATION AND CURETTAGE;  Surgeon: Betsy Coder, MD;  Location: [facility];  Service: Gynecology;  Laterality: N/A;   GANGLION CYST EXCISION Left    LAPAROSCOPY N/A 01/18/2020   Procedure: LAPAROSCOPY DIAGNOSTIC, WITH EVACTION OF HEMOPERITONEUM;  Surgeon: Sloan Leiter, MD;  Location: [facility];  Service: Gynecology;  Laterality: N/A;   TUBAL LIGATION      ROS: Review of Systems Negative except as stated above  PHYSICAL EXAM: BP 134/86 (BP Location: Left Arm, Patient Position: Sitting, Cuff Size: Large)   Pulse 88   Temp 98.1 F (36.7 C)   Resp 18   Ht 5' 4.02" (1.626 m)   Wt 221 lb 6.4 oz (100.4 kg)   LMP 05/19/2013 Comment: Negative HCG in ED today  SpO2 93%   BMI 37.98  kg/m  Physical Exam HENT:     Head: Normocephalic and atraumatic.  Eyes:     Extraocular Movements: Extraocular movements intact.     Conjunctiva/sclera: Conjunctivae normal.     Pupils: Pupils are equal, round, and reactive to light.  Cardiovascular:     Rate and Rhythm: Normal rate and regular rhythm.     Heart sounds: Normal heart sounds.  Pulmonary:     Effort: Pulmonary effort is normal.     Breath sounds: Normal breath sounds.  Musculoskeletal:     Cervical back: Normal range of motion and neck supple.  Neurological:     General: No focal deficit present.  Mental Status: She is alert and oriented to person, place, and time.  Psychiatric:        Mood and Affect: Mood normal.        Behavior: Behavior normal.   ASSESSMENT AND PLAN: 1. Migraine without aura and without status migrainosus, not intractable: - Patient today in office without neurological deficits and no evidence of red flag symptoms.  - Begin Sumatriptan and Topiramate as prescribed. Discussed medication compliance and adverse effects. Patient verbalized understanding.  - Patient provided with work note to restrict work schedule to 40 hours or less weekly. Currently working 50 hours or more every week.  - Follow-up with primary provider in 4 weeks or sooner if needed.  - SUMAtriptan (IMITREX) 25 MG tablet; Take 1 tablet (25 mg total) by mouth at the start of the headache. May repeat in 2 hours x 1 if headache persists. Max of 2 tabs/24 hours  Dispense: 30 tablet; Refill: 0 - topiramate (TOPAMAX) 25 MG tablet; Take 1 tablet (25 mg total) by mouth at bedtime.  Dispense: 30 tablet; Refill: 0  2. Palpitations: 3. Chest pain, unspecified type: 4. Abnormal EKG: 5. Family history of heart disease: 6. Essential hypertension: - Patient today in office without evidence of cardiopulmonary distress.  - Home blood pressures are at goal.  - Referral to Cardiology for further evaluation and management.  - Ambulatory  referral to Cardiology   Patient was given the opportunity to ask questions.  Patient verbalized understanding of the plan and was able to repeat key elements of the plan. Patient was given clear instructions to go to Emergency Department or return to medical center if symptoms don't improve, worsen, or new problems develop.The patient verbalized understanding.   Orders Placed This Encounter  Procedures   Ambulatory referral to Cardiology    Requested Prescriptions   Signed Prescriptions Disp Refills   SUMAtriptan (IMITREX) 25 MG tablet 30 tablet 0    Sig: Take 1 tablet (25 mg total) by mouth at the start of the headache. May repeat in 2 hours x 1 if headache persists. Max of 2 tabs/24 hours   topiramate (TOPAMAX) 25 MG tablet 30 tablet 0    Sig: Take 1 tablet (25 mg total) by mouth at bedtime.    Return in about 4 weeks (around 05/06/2021) for Follow-Up or next available headaches.  Camillia Herter, NP

## 2021-04-08 ENCOUNTER — Ambulatory Visit (INDEPENDENT_AMBULATORY_CARE_PROVIDER_SITE_OTHER): Payer: 59 | Admitting: Family

## 2021-04-08 ENCOUNTER — Other Ambulatory Visit: Payer: Self-pay

## 2021-04-08 VITALS — BP 134/86 | HR 88 | Temp 98.1°F | Resp 18 | Ht 64.02 in | Wt 221.4 lb

## 2021-04-08 DIAGNOSIS — I10 Essential (primary) hypertension: Secondary | ICD-10-CM

## 2021-04-08 DIAGNOSIS — R002 Palpitations: Secondary | ICD-10-CM

## 2021-04-08 DIAGNOSIS — G43009 Migraine without aura, not intractable, without status migrainosus: Secondary | ICD-10-CM

## 2021-04-08 DIAGNOSIS — R9431 Abnormal electrocardiogram [ECG] [EKG]: Secondary | ICD-10-CM | POA: Diagnosis not present

## 2021-04-08 DIAGNOSIS — R079 Chest pain, unspecified: Secondary | ICD-10-CM

## 2021-04-08 DIAGNOSIS — Z8249 Family history of ischemic heart disease and other diseases of the circulatory system: Secondary | ICD-10-CM

## 2021-04-08 MED ORDER — SUMATRIPTAN SUCCINATE 25 MG PO TABS
ORAL_TABLET | ORAL | 0 refills | Status: AC
Start: 2021-04-08 — End: ?

## 2021-04-08 MED ORDER — TOPIRAMATE 25 MG PO TABS
25.0000 mg | ORAL_TABLET | Freq: Every day | ORAL | 0 refills | Status: AC
Start: 2021-04-08 — End: 2021-06-07

## 2021-04-08 NOTE — Patient Instructions (Signed)
Palpitations Palpitations are feelings that your heartbeat is not normal. Your heartbeat may feel like it is: Uneven. Faster than normal. Fluttering. Skipping a beat. This is usually not a serious problem. In some cases, you may need tests torule out any serious problems. Follow these instructions at home: Pay attention to any changes in your condition. Take these actions to helpmanage your symptoms: Eating and drinking Avoid: Coffee, tea, soft drinks, and energy drinks. Chocolate. Alcohol. Diet pills. Lifestyle  Try to lower your stress. These things can help you relax: Yoga. Deep breathing and meditation. Exercise. Using words and images to create positive thoughts (guided imagery). Using your mind to control things in your body (biofeedback). Do not use drugs. Get plenty of rest and sleep. Keep a regular bed time.  General instructions  Take over-the-counter and prescription medicines only as told by your doctor. Do not use any products that contain nicotine or tobacco, such as cigarettes and e-cigarettes. If you need help quitting, ask your doctor. Keep all follow-up visits as told by your doctor. This is important. You may need more tests if palpitations do not go away or get worse.  Contact a doctor if: Your symptoms last more than 24 hours. Your symptoms occur more often. Get help right away if you: Have chest pain. Feel short of breath. Have a very bad headache. Feel dizzy. Pass out (faint). Summary Palpitations are feelings that your heartbeat is uneven or faster than normal. It may feel like your heart is fluttering or skipping a beat. Avoid food and drinks that may cause palpitations. These include caffeine, chocolate, and alcohol. Try to lower your stress. Do not smoke or use drugs. Get help right away if you faint or have chest pain, shortness of breath, a severe headache, or dizziness. This information is not intended to replace advice given to you by your  health care provider. Make sure you discuss any questions you have with your healthcare provider. Document Revised: 09/12/2017 Document Reviewed: 09/12/2017 Elsevier Patient Education  2022 [employer]  

## 2021-04-08 NOTE — Progress Notes (Signed)
Pt presents for headache, pt reports constant headache for past few days, states had mild palpitations this morning

## 2021-05-24 ENCOUNTER — Ambulatory Visit: Payer: 59 | Admitting: Internal Medicine

## 2021-05-24 NOTE — Progress Notes (Deleted)
Cardiology Office Note:    Date:  05/24/2021   ID:  Anne Reese, DOB [birth year], MRN [id number]  PCP:  Camillia Herter, NP   [facility] Providers Cardiologist:  None { Click to update primary MD,subspecialty MD or APP then REFRESH:1}    Referring MD: Camillia Herter, NP   No chief complaint on file. ***  History of Present Illness:    Anne Reese is a 45 y.o. female with a hx of *** chest pain in August 2022 after seeing the NP.   Noted to have good blood pressures. EKG 04/05/2021 shows sinus rhythm with no significant ST-T changes.  Past Medical History:  Diagnosis Date   Asthma    no inhaler use x 15 yrs   Complication of anesthesia    asthma attack in PACU   Depression    Diverticulitis    Fibroids 2013   Hyperlipidemia    Hypertension     Past Surgical History:  Procedure Laterality Date   ABDOMINAL HYSTERECTOMY N/A 06/24/2013   Procedure: HYSTERECTOMY ABDOMINAL;  Surgeon: Betsy Coder, MD;  Location: [place] [facility];  Service: Gynecology;  Laterality: N/A;   BILATERAL SALPINGECTOMY Bilateral 06/24/2013   Procedure: BILATERAL SALPINGECTOMY;  Surgeon: Betsy Coder, MD;  Location: [place] [facility];  Service: Gynecology;  Laterality: Bilateral;   CYSTOSCOPY N/A 06/24/2013   Procedure: CYSTOSCOPY;  Surgeon: Betsy Coder, MD;  Location: [place] [facility];  Service: Gynecology;  Laterality: N/A;   DILATION AND CURETTAGE OF UTERUS N/A 06/24/2013   Procedure: DILATATION AND CURETTAGE;  Surgeon: Betsy Coder, MD;  Location: [place] [facility];  Service: Gynecology;  Laterality: N/A;   GANGLION CYST EXCISION Left    LAPAROSCOPY N/A 01/18/2020   Procedure: LAPAROSCOPY DIAGNOSTIC, WITH EVACTION OF HEMOPERITONEUM;  Surgeon: Sloan Leiter, MD;  Location: [place];  Service: Gynecology;  Laterality: N/A;   TUBAL LIGATION      Current Medications: No outpatient medications have been marked as taking for the 05/24/21 encounter (Appointment) with Janina Mayo, MD.     Allergies:   Patient has no  known allergies.   Social History   Socioeconomic History   Marital status: Married    Spouse name: Not on file   Number of children: 3   Years of education: Not on file   Highest education level: Not on file  Occupational History   Occupation: Administrator, arts  Tobacco Use   Smoking status: Every Day    Packs/day: 1.00    Types: Cigarettes   Smokeless tobacco: Never  Vaping Use   Vaping Use: Never used  Substance and Sexual Activity   Alcohol use: Yes    Comment: occasional   Drug use: No   Sexual activity: Yes    Partners: Female    Birth control/protection: Surgical    Comment: monogamous, 4 years, no toys  Other Topics Concern   Not on file  Social History Narrative   Not on file   Social Determinants of Health   Financial Resource Strain: Not on file  Food Insecurity: Not on file  Transportation Needs: Not on file  Physical Activity: Not on file  Stress: Not on file  Social Connections: Not on file     Family History: The patient's ***family history includes Brain cancer in an other family member; Breast cancer in an other family member; Colon cancer in her maternal grandfather; HIV in her brother; Heart disease in her maternal grandmother; Hypertension in her maternal grandfather, maternal grandmother, and  mother; Lupus in her daughter; Prostate cancer in her maternal grandfather; Sickle cell anemia in her maternal grandmother.  ROS:   Please see the history of present illness.    *** All other systems reviewed and are negative.  EKGs/Labs/Other Studies Reviewed:    The following studies were reviewed today: ***  EKG:  EKG is *** ordered today.  The ekg ordered today demonstrates ***  Recent Labs: 04/05/2021: ALT 18; BUN 14; Creatinine, Ser 0.94; Hemoglobin 12.9; Magnesium 2.0; Platelets 291; Potassium 3.4; Sodium 140  Recent Lipid Panel    Component Value Date/Time   CHOL 205 (H) 12/25/2019 1409   TRIG 200 (H) 12/25/2019 1409   HDL 42 12/25/2019  1409   CHOLHDL 4.9 (H) 12/25/2019 1409   LDLCALC 127 (H) 12/25/2019 1409     Risk Assessment/Calculations:   {Does this patient have ATRIAL FIBRILLATION?:[phone]}       Physical Exam:    VS:  LMP 05/19/2013 Comment: Negative HCG in ED today    Wt Readings from Last 3 Encounters:  04/08/21 221 lb 6.4 oz (100.4 kg)  04/05/21 222 lb (100.7 kg)  12/06/20 238 lb 9.6 oz (108.2 kg)     GEN: *** Well nourished, well developed in no acute distress HEENT: Normal NECK: No JVD; No carotid bruits LYMPHATICS: No lymphadenopathy CARDIAC: ***RRR, no murmurs, rubs, gallops RESPIRATORY:  Clear to auscultation without rales, wheezing or rhonchi  ABDOMEN: Soft, non-tender, non-distended MUSCULOSKELETAL:  No edema; No deformity  SKIN: Warm and dry NEUROLOGIC:  Alert and oriented x 3 PSYCHIATRIC:  Normal affect   ASSESSMENT:    No diagnosis found. PLAN:    In order of problems listed above:  ***   {Are you ordering a CV Procedure (e.g. stress test, cath, DCCV, TEE, etc)?   Press F2        :[id number]}    Medication Adjustments/Labs and Tests Ordered: Current medicines are reviewed at length with the patient today.  Concerns regarding medicines are outlined above.  No orders of the defined types were placed in this encounter.  No orders of the defined types were placed in this encounter.   There are no Patient Instructions on file for this visit.   Signed, Janina Mayo, MD  05/24/2021 8:47 AM    [facility]

## 2021-06-01 ENCOUNTER — Other Ambulatory Visit: Payer: Self-pay | Admitting: Family

## 2021-06-01 DIAGNOSIS — I10 Essential (primary) hypertension: Secondary | ICD-10-CM

## 2021-06-06 NOTE — Progress Notes (Signed)
Cardiology Office Note:    Date:  06/06/2021   ID:  Anne Reese, DOB June 14, 1976, MRN [id number]  PCP:  Camillia Herter, NP   [facility] Providers Cardiologist:  None     Referring MD: Camillia Herter, NP   No chief complaint on file. Palpitations  History of Present Illness:    Anne Reese is a 45 y.o. female with  no periods since 2014,  migraines, here for palpitations   She notes for a few years she has had palpitations. She notes skipped heart beats or delayed. It happens every day. No recurrent LH or dizziness. Every once in awhile [facility] with standing. She denies syncope. No family hx of SCD. Stopped smoking for 20 years, 2 years ago moved back and smoking. Nicotine patches cannot afford. Down to 1 ppd. In the past she took wellbutrin which helped. She notes SOB with getting up suddenly, moving around. She can walk up a flight of stairs. She denies angina, dyspnea on exertion, lower extremity edema, PND or orthopnea.   TSH 1.5  A1c 4.2%  Cardiac Hx: no stress test, no echo, no heart LHC  Social hx: smoker  Family hx: Maternal GM/GF cardiac hx. Mother- no heart dx. No siblings. Three kids, healthy  Past Medical History: HTN- well controlled medications-norvasc 10 mg daily, migraines  Cardiology Studies: EKG 04/06/2021- NSR Prior Qtc 486 ms   Past Medical History:  Diagnosis Date   Asthma    no inhaler use x 15 yrs   Complication of anesthesia    asthma attack in PACU   Depression    Diverticulitis    Fibroids 2013   Hyperlipidemia    Hypertension     Past Surgical History:  Procedure Laterality Date   ABDOMINAL HYSTERECTOMY N/A 06/24/2013   Procedure: HYSTERECTOMY ABDOMINAL;  Surgeon: Betsy Coder, MD;  Location: [place] [facility];  Service: Gynecology;  Laterality: N/A;   BILATERAL SALPINGECTOMY Bilateral 06/24/2013   Procedure: BILATERAL SALPINGECTOMY;  Surgeon: Betsy Coder, MD;  Location: [place] [facility];  Service: Gynecology;  Laterality: Bilateral;   CYSTOSCOPY  N/A 06/24/2013   Procedure: CYSTOSCOPY;  Surgeon: Betsy Coder, MD;  Location: [place] [facility];  Service: Gynecology;  Laterality: N/A;   DILATION AND CURETTAGE OF UTERUS N/A 06/24/2013   Procedure: DILATATION AND CURETTAGE;  Surgeon: Betsy Coder, MD;  Location: [place] [facility];  Service: Gynecology;  Laterality: N/A;   GANGLION CYST EXCISION Left    LAPAROSCOPY N/A 01/18/2020   Procedure: LAPAROSCOPY DIAGNOSTIC, WITH EVACTION OF HEMOPERITONEUM;  Surgeon: Sloan Leiter, MD;  Location: [place];  Service: Gynecology;  Laterality: N/A;   TUBAL LIGATION      Current Medications: No outpatient medications have been marked as taking for the 06/07/21 encounter (Appointment) with Janina Mayo, MD.     Allergies:   Patient has no known allergies.   Social History   Socioeconomic History   Marital status: Married    Spouse name: Not on file   Number of children: 3   Years of education: Not on file   Highest education level: Not on file  Occupational History   Occupation: Administrator, arts  Tobacco Use   Smoking status: Every Day    Packs/day: 1.00    Types: Cigarettes   Smokeless tobacco: Never  Vaping Use   Vaping Use: Never used  Substance and Sexual Activity   Alcohol use: Yes    Comment: occasional   Drug use: No   Sexual activity: Yes  Partners: Female    Birth control/protection: Surgical    Comment: monogamous, 4 years, no toys  Other Topics Concern   Not on file  Social History Narrative   Not on file   Social Determinants of Health   Financial Resource Strain: Not on file  Food Insecurity: Not on file  Transportation Needs: Not on file  Physical Activity: Not on file  Stress: Not on file  Social Connections: Not on file     Family History: The patient's family history includes Brain cancer in an other family member; Breast cancer in an other family member; Colon cancer in her maternal grandfather; HIV in her brother; Heart disease in her maternal grandmother; Hypertension  in her maternal grandfather, maternal grandmother, and mother; Lupus in her daughter; Prostate cancer in her maternal grandfather; Sickle cell anemia in her maternal grandmother.  ROS:   Please see the history of present illness.     All other systems reviewed and are negative.  EKGs/Labs/Other Studies Reviewed:    The following studies were reviewed today:   EKG:  EKG is  ordered today.  The ekg ordered today demonstrates   NSR, QTc 454 ms  Recent Labs: 04/05/2021: ALT 18; BUN 14; Creatinine, Ser 0.94; Hemoglobin 12.9; Magnesium 2.0; Platelets 291; Potassium 3.4; Sodium 140  Recent Lipid Panel    Component Value Date/Time   CHOL 205 (H) 12/25/2019 1409   TRIG 200 (H) 12/25/2019 1409   HDL 42 12/25/2019 1409   CHOLHDL 4.9 (H) 12/25/2019 1409   LDLCALC 127 (H) 12/25/2019 1409     Risk Assessment/Calculations:     The 10-year ASCVD risk score (Arnett DK, et al., 2019) is: 9.4%   Values used to calculate the score:     Age: 76 years     Sex: Female     Is Non-Hispanic African American: Yes     Diabetic: No     Tobacco smoker: Yes     Systolic Blood Pressure: 250 mmHg     Is BP treated: Yes     HDL Cholesterol: 42 mg/dL     Total Cholesterol: 205 mg/dL       Physical Exam:    VS:  LMP 05/19/2013 Comment: Negative HCG in ED today    Wt Readings from Last 3 Encounters:  04/08/21 221 lb 6.4 oz (100.4 kg)  04/05/21 222 lb (100.7 kg)  12/06/20 238 lb 9.6 oz (108.2 kg)     GEN:  Well nourished, well developed in no acute distress HEENT: Normal NECK: No JVD; No carotid bruits CARDIAC: RRR, no murmurs, rubs, gallops RESPIRATORY:  Clear to auscultation without rales, wheezing or rhonchi  ABDOMEN: Soft, non-tender, non-distended MUSCULOSKELETAL:  No edema; No deformity  SKIN: Warm and dry NEUROLOGIC:  Alert and oriented x 3 PSYCHIATRIC:  Normal affect   ASSESSMENT:    #Palpitations: She does not have concerning features, episodes are daily. Qtc mildly prolonged, <  500 ms. Will plan for a cardiac event monitor to assess for arrhythmia and follow up. If she has any abnormalities, will obtain TTE  for further workup.   #HTN: well controlled. Continue norvasc  #CVD risk modification: ASCVD 9.4%. on lipitor 40 mg.  #Smoking: referral to Avelino Leeds for support. She tried wellbutrin in the past and she liked it   PLAN:    In order of problems listed above:  Cardiac event monitor Follow up 3 months      Medication Adjustments/Labs and Tests Ordered: Current medicines are reviewed  at length with the patient today.  Concerns regarding medicines are outlined above.    Signed, Janina Mayo, MD  06/06/2021 4:06 PM    [facility]

## 2021-06-07 ENCOUNTER — Ambulatory Visit (INDEPENDENT_AMBULATORY_CARE_PROVIDER_SITE_OTHER): Payer: 59 | Admitting: Internal Medicine

## 2021-06-07 ENCOUNTER — Telehealth: Payer: Self-pay

## 2021-06-07 ENCOUNTER — Other Ambulatory Visit: Payer: Self-pay

## 2021-06-07 ENCOUNTER — Ambulatory Visit (INDEPENDENT_AMBULATORY_CARE_PROVIDER_SITE_OTHER): Payer: 59

## 2021-06-07 ENCOUNTER — Ambulatory Visit: Payer: 59

## 2021-06-07 ENCOUNTER — Encounter: Payer: Self-pay | Admitting: Internal Medicine

## 2021-06-07 VITALS — BP 122/82 | HR 86 | Ht 63.0 in | Wt 218.6 lb

## 2021-06-07 DIAGNOSIS — R002 Palpitations: Secondary | ICD-10-CM | POA: Diagnosis not present

## 2021-06-07 DIAGNOSIS — Z720 Tobacco use: Secondary | ICD-10-CM

## 2021-06-07 DIAGNOSIS — Z0000 Encounter for general adult medical examination without abnormal findings: Secondary | ICD-10-CM

## 2021-06-07 DIAGNOSIS — I10 Essential (primary) hypertension: Secondary | ICD-10-CM

## 2021-06-07 NOTE — Patient Instructions (Signed)
Medication Instructions:  Your physician recommends that you continue on your current medications as directed. Please refer to the Current Medication list given to you today.  Testing/Procedures: Bryn Gulling- Long Term Monitor Instructions   Your physician has requested you wear a ZIO patch monitor for _14__ days.  This is a single patch monitor.   [employer] supplies one patch monitor per enrollment. Additional stickers are not available. Please do not apply patch if you will be having a Nuclear Stress Test, Echocardiogram, Cardiac CT, MRI, or Chest Xray during the period you would be wearing the monitor. The patch cannot be worn during these tests. You cannot remove and re-apply the ZIO XT patch monitor.  Your ZIO patch monitor will be sent Fed Ex from [employer] directly to your home address. It may take 3-5 days to receive your monitor after you have been enrolled.  Once you have received your monitor, please review the enclosed instructions. Your monitor has already been registered assigning a specific monitor serial # to you.  Billing and Patient Assistance Program Information   We have supplied [employer] with any of your insurance information on file for billing purposes. [employer] offers a sliding scale Patient Assistance Program for patients that do not have insurance, or whose insurance does not completely cover the cost of the ZIO monitor.   You must apply for the Patient Assistance Program to qualify for this discounted rate.     To apply, please call [employer] at [phone], select option 4, then select option 2, and ask to apply for Patient Assistance Program.  Theodore Demark will ask your household income, and how many people are in your household.  They will quote your out-of-pocket cost based on that information.  [employer] will also be able to set up a 33-month, interest-free payment plan if needed.  Applying the monitor   Shave hair from upper left chest.  Hold abrader disc by orange tab.  Rub abrader in 40 strokes over the upper left chest as indicated in your monitor instructions.  Clean area with 4 enclosed alcohol pads. Let dry.  Apply patch as indicated in monitor instructions. Patch will be placed under collarbone on left side of chest with arrow pointing upward.  Rub patch adhesive wings for 2 minutes. Remove white label marked "1". Remove the white label marked "2". Rub patch adhesive wings for 2 additional minutes.  While looking in a mirror, press and release button in center of patch. A small green light will flash 3-4 times. This will be your only indicator that the monitor has been turned on. ?  Do not shower for the first 24 hours. You may shower after the first 24 hours.  Press the button if you feel a symptom. You will hear a small click. Record Date, Time and Symptom in the Patient Logbook.  When you are ready to remove the patch, follow instructions on the last 2 pages of the Patient Logbook. Stick patch monitor onto the last page of Patient Logbook.  Place Patient Logbook in the blue and white box.  Use locking tab on box and tape box closed securely.  The blue and white box has prepaid postage on it. Please place it in the mailbox as soon as possible. Your physician should have your test results approximately 7 days after the monitor has been mailed back to [facility].  Call [facility] at [phone] if you have questions regarding your ZIO XT patch monitor. Call them immediately if you see  an orange light blinking on your monitor.  If your monitor falls off in less than 4 days, contact our Monitor department at [phone]. ?If your monitor becomes loose or falls off after 4 days call [employer] at [phone] for suggestions on securing your monitor.?  Follow-Up: At [facility], you and your health needs are our priority.  As part of our continuing mission to provide you with exceptional heart care, we have created designated Provider Care  Teams.  These Care Teams include your primary Cardiologist (physician) and Advanced Practice Providers (APPs -  Physician Assistants and Nurse Practitioners) who all work together to provide you with the care you need, when you need it.  We recommend signing up for the patient portal called "MyChart".  Sign up information is provided on this After Visit Summary.  MyChart is used to connect with patients for Virtual Visits (Telemedicine).  Patients are able to view lab/test results, encounter notes, upcoming appointments, etc.  Non-urgent messages can be sent to your provider as well.   To learn more about what you can do with MyChart, go to [email or web address].    Your next appointment:   3 month(s)  The format for your next appointment:   In Person  Provider:   Dr. Harl Bowie   Other Instructions You have been referred to: our careguide, Amy to assist with smoking cessation  Steps to Quit Smoking Smoking tobacco is the leading cause of preventable death. It can affect almost every organ in the body. Smoking puts you and those around you at risk for developing many serious chronic diseases. Quitting smoking can be difficult, but it is one of the best things that you can do for your health. It is never too late to quit. How do I get ready to quit? When you decide to quit smoking, create a plan to help you succeed. Before you quit: Pick a date to quit. Set a date within the next 2 weeks to give you time to prepare. Write down the reasons why you are quitting. Keep this list in places where you will see it often. Tell your family, friends, and co-workers that you are quitting. Support from your loved ones can make quitting easier. Talk with your health care provider about your options for quitting smoking. Find out what treatment options are covered by your health insurance. Identify people, places, things, and activities that make you want to smoke (triggers). Avoid them. What first steps  can I take to quit smoking? Throw away all cigarettes at home, at work, and in your car. Throw away smoking accessories, such as Scientist, research (medical). Clean your car. Make sure to empty the ashtray. Clean your home, including curtains and carpets. What strategies can I use to quit smoking? Talk with your health care provider about combining strategies, such as taking medicines while you are also receiving in-person counseling. Using these two strategies together makes you more likely to succeed in quitting than if you used either strategy on its own. If you are pregnant or breastfeeding, talk with your health care provider about finding counseling or other support strategies to quit smoking. Do not take medicine to help you quit smoking unless your health care provider tells you to do so. To quit smoking: Quit right away Quit smoking completely, instead of gradually reducing how much you smoke over a period of time. Research shows that stopping smoking right away is more successful than gradually quitting. Attend in-person counseling to help you build problem-solving skills. You  are more likely to succeed in quitting if you attend counseling sessions regularly. Even short sessions of 10 minutes can be effective. Take medicine You may take medicines to help you quit smoking. Some medicines require a prescription and some you can purchase over-the-counter. Medicines may have nicotine in them to replace the nicotine in cigarettes. Medicines may: Help to stop cravings. Help to relieve withdrawal symptoms. Your health care provider may recommend: Nicotine patches, gum, or lozenges. Nicotine inhalers or sprays. Non-nicotine medicine that is taken by mouth. Find resources Find resources and support systems that can help you to quit smoking and remain smoke-free after you quit. These resources are most helpful when you use them often. They include: Online chats with a Social worker. Telephone  quitlines. Printed Furniture conservator/restorer. Support groups or group counseling. Text messaging programs. Mobile phone apps or applications. Use apps that can help you stick to your quit plan by providing reminders, tips, and encouragement. There are many free apps for mobile devices as well as websites. Examples include Quit Guide from the [employer] and smokefree.gov What things can I do to make it easier to quit?  Reach out to your family and friends for support and encouragement. Call telephone quitlines (1-800-QUIT-NOW), reach out to support groups, or work with a counselor for support. Ask people who smoke to avoid smoking around you. Avoid places that trigger you to smoke, such as bars, parties, or smoke-break areas at work. Spend time with people who do not smoke. Lessen the stress in your life. Stress can be a smoking trigger for some people. To lessen stress, try: Exercising regularly. Doing deep-breathing exercises. Doing yoga. Meditating. Performing a body scan. This involves closing your eyes, scanning your body from head to toe, and noticing which parts of your body are particularly tense. Try to relax the muscles in those areas. How will I feel when I quit smoking? Day 1 to 3 weeks Within the first 24 hours of quitting smoking, you may start to feel withdrawal symptoms. These symptoms are usually most noticeable 2-3 days after quitting, but they usually do not last for more than 2-3 weeks. You may experience these symptoms: Mood swings. Restlessness, anxiety, or irritability. Trouble concentrating. Dizziness. Strong cravings for sugary foods and nicotine. Mild weight gain. Constipation. Nausea. Coughing or a sore throat. Changes in how the medicines that you take for unrelated issues work in your body. Depression. Trouble sleeping (insomnia). Week 3 and afterward After the first 2-3 weeks of quitting, you may start to notice more positive results, such as: Improved sense of smell  and taste. Decreased coughing and sore throat. Slower heart rate. Lower blood pressure. Clearer skin. The ability to breathe more easily. Fewer sick days. Quitting smoking can be very challenging. Do not get discouraged if you are not successful the first time. Some people need to make many attempts to quit before they achieve long-term success. Do your best to stick to your quit plan, and talk with your health care provider if you have any questions or concerns. Summary Smoking tobacco is the leading cause of preventable death. Quitting smoking is one of the best things that you can do for your health. When you decide to quit smoking, create a plan to help you succeed. Quit smoking right away, not slowly over a period of time. When you start quitting, seek help from your health care provider, family, or friends. This information is not intended to replace advice given to you by your health care provider. Make  sure you discuss any questions you have with your health care provider. Document Revised: 04/25/2019 Document Reviewed: 10/19/2018 Elsevier Patient Education  [place].

## 2021-06-07 NOTE — Telephone Encounter (Signed)
Called patient to inquire about health coaching for smoking cessation per referral from Dr. Harl Bowie. Patient is interested in health coaching and requested a call back today after 2:30pm to start health coaching. Patient has been scheduled for her initial health coaching session at 3:00pm today. Patient will be called at this scheduled time.    Yides Saidi [name], [facility] Guide, Health Coach [address] [place] [address] Telephone: [phone] Email: [email or web address].

## 2021-06-07 NOTE — Progress Notes (Unsigned)
Patient enrolled for [employer] to mail a 14 day ZIO XT monitor to her address on file.

## 2021-06-07 NOTE — Progress Notes (Signed)
Appointment Outcome:  Completed, Session #: Initial health coaching session Start time: 3:01pm   End time: 3:52pm   Total Mins: 51 minutes  AGREEMENTS SECTION   Overall Goal(s): Smoking cessation   Agreement/Action Steps:  Maintain 0 cigarettes per day Refrain from purchasing or asking for cigarettes Set smoking boundaries with others Practice deep breathing when the urge arises to smoke Conduct self-check-ins and implement positive self-talk Light scented candle when sitting at computer Drink water to manage cravings Eat hard candies/chew sticks to deter smoking Track progress using Weekly Tracker sheets Utilize support system  Progress Notes:  Patient stated that she is smoking to cope with stress and when she is bored. Patient shared that smoking no longer gives her pleasure. Patient's major routine around smoking is smoking after eating or while going to the restroom.   Patient stated that she was smoke free in 2006 and started smoking again in 2021 when she returned to [facility] and was around her mother and brother who smokes. Patient stated that she used the technique of using a pen or straw in place of a cigarette to mimic the hand motion during smoking. Patient reported that she is currently practicing deep breathing and drinking water.   Patient stated that she can smoke approximately 12 cigarettes at home alone. Patient also mentioned that she smokes about 4 cigarettes at work (1 every 2 hours) during breaks. Patient normally smokes on her breaks with a co-worker. Patient mentioned that she smokes one cigarette after work, but smokes more when she is sitting to the computer playing a game. Patient smokes at that time without thinking about it.   Patient is concerned about gaining weight after quitting smoking. However, patient shared that she has changed her eating habits and lost weight. Patient believes that she has the willpower to control her eating without smoking because she  doesn't eat the way she used to.   Patient has a support system in place that she can rely on for help with quitting. Patient stated that with her mother and brother quitting smoking they are supporting her with quitting and will hold her accountable.     Coaching Outcomes: Patient is motivated to quit because she believes that life is bigger than her and that she wants to be healthy so she can be around for her grandchild, but above all, she wants to quit for herself. Patient stated that she that she wants to quit cold [place] and know that she can do it again. Patient stated that she is competitive and holds herself to a high standard, which will help her hold herself accountable. Patient stated that she no longer has cigarettes and will not purchase or ask any for a cigarette moving forward. Patient will work on setting boundaries with others smoking around her including her co-worker.  Patient will continue to practice deep breathing and drinking water. Patient will add hard candy and chew sticks to aid in deterring smoking and managing cravings. While sitting at the computer, the patient will light a scented candle to relax instead of smelling the smoke from a cigarette. Patient will practice positive self-talk and conduct self-check-ins when she has urges to smoke.

## 2021-06-08 ENCOUNTER — Encounter: Payer: 59 | Admitting: Family

## 2021-06-08 NOTE — Progress Notes (Signed)
Erroneous encounter

## 2021-06-21 ENCOUNTER — Telehealth: Payer: Self-pay

## 2021-06-21 ENCOUNTER — Ambulatory Visit: Payer: 59

## 2021-06-21 DIAGNOSIS — Z0000 Encounter for general adult medical examination without abnormal findings: Secondary | ICD-10-CM

## 2021-06-21 NOTE — Telephone Encounter (Signed)
Called patient during scheduled health coaching session. Was not able to leave a message because mailbox has not been set up. Will attempt to call the patient again to reschedule appointment on 11/9.  [name] [name], [name] Guide, Health Coach [address], [address] [place] [address] Telephone: [phone] Email: [email or web address]

## 2021-07-05 ENCOUNTER — Other Ambulatory Visit: Payer: Self-pay | Admitting: Family

## 2021-07-05 DIAGNOSIS — M7732 Calcaneal spur, left foot: Secondary | ICD-10-CM

## 2021-07-05 DIAGNOSIS — F32A Depression, unspecified: Secondary | ICD-10-CM

## 2021-07-05 DIAGNOSIS — E7841 Elevated Lipoprotein(a): Secondary | ICD-10-CM

## 2021-07-05 DIAGNOSIS — I10 Essential (primary) hypertension: Secondary | ICD-10-CM

## 2021-07-05 DIAGNOSIS — M7731 Calcaneal spur, right foot: Secondary | ICD-10-CM

## 2021-07-05 DIAGNOSIS — M722 Plantar fascial fibromatosis: Secondary | ICD-10-CM

## 2021-07-12 ENCOUNTER — Telehealth (INDEPENDENT_AMBULATORY_CARE_PROVIDER_SITE_OTHER): Payer: 59 | Admitting: Nurse Practitioner

## 2021-07-12 ENCOUNTER — Telehealth: Payer: Self-pay

## 2021-07-12 ENCOUNTER — Other Ambulatory Visit: Payer: Self-pay

## 2021-07-12 DIAGNOSIS — R051 Acute cough: Secondary | ICD-10-CM

## 2021-07-12 DIAGNOSIS — R6889 Other general symptoms and signs: Secondary | ICD-10-CM

## 2021-07-12 DIAGNOSIS — F32A Depression, unspecified: Secondary | ICD-10-CM

## 2021-07-12 DIAGNOSIS — Z0000 Encounter for general adult medical examination without abnormal findings: Secondary | ICD-10-CM

## 2021-07-12 DIAGNOSIS — I10 Essential (primary) hypertension: Secondary | ICD-10-CM

## 2021-07-12 DIAGNOSIS — E7841 Elevated Lipoprotein(a): Secondary | ICD-10-CM

## 2021-07-12 DIAGNOSIS — Z760 Encounter for issue of repeat prescription: Secondary | ICD-10-CM

## 2021-07-12 DIAGNOSIS — K219 Gastro-esophageal reflux disease without esophagitis: Secondary | ICD-10-CM

## 2021-07-12 DIAGNOSIS — J069 Acute upper respiratory infection, unspecified: Secondary | ICD-10-CM | POA: Diagnosis not present

## 2021-07-12 MED ORDER — OMEPRAZOLE 10 MG PO CPDR: 10.0000 mg | DELAYED_RELEASE_CAPSULE | Freq: Every day | ORAL | 0 refills | Status: DC

## 2021-07-12 MED ORDER — AMLODIPINE BESYLATE 10 MG PO TABS: 10.0000 mg | ORAL_TABLET | Freq: Every day | ORAL | 0 refills | Status: DC

## 2021-07-12 MED ORDER — ATORVASTATIN CALCIUM 40 MG PO TABS: 40.0000 mg | ORAL_TABLET | Freq: Every day | ORAL | 0 refills | Status: DC

## 2021-07-12 MED ORDER — PROMETHAZINE-DM 6.25-15 MG/5ML PO SYRP: 2.5000 mL | ORAL_SOLUTION | Freq: Four times a day (QID) | ORAL | 0 refills | Status: DC | PRN

## 2021-07-12 MED ORDER — ESCITALOPRAM OXALATE 10 MG PO TABS: 10.0000 mg | ORAL_TABLET | Freq: Every day | ORAL | 0 refills | Status: DC

## 2021-07-12 NOTE — Telephone Encounter (Signed)
Called patient to reschedule health coaching session. Patient did not answer. Was not able to leave a message because voicemail is not setup.   Eleuterio Dollar [name], [facility] Guide, Health Coach [address] [place] [address] Telephone: [phone] Email: [email or web address]

## 2021-07-12 NOTE — Progress Notes (Signed)
Virtual Visit via Telephone Note  I connected with Anne Reese on 07/12/21 at  8:40 AM EST by telephone and verified that I am speaking with the correct person using two identifiers.  Location: Patient: home Provider: office   I discussed the limitations, risks, security and privacy concerns of performing an evaluation and management service by telephone and the availability of in person appointments. I also discussed with the patient that there may be a patient responsible charge related to this service. The patient expressed understanding and agreed to proceed.   History of Present Illness:  Patient presents today for sick visit through telephone visit.  Patient states that her symptoms started this past Sunday.  She states that she has been having cough, muscle aches, sore throat, headache.  She denies any significant shortness of breath. Denies f/c/s, n/v/d, hemoptysis, PND, chest pain or edema.  Note: patient is also requesting medication refills - will refill, but she will need to follow up with PCP   Observations/Objective:  Vitals with BMI 06/07/2021 04/08/2021 04/06/2021  Height 5\' 3"  5' 4.016" -  Weight 218 lbs 10 oz 221 lbs 6 oz -  BMI 90.24 09.73 -  Systolic 532 992 -  Diastolic 82 86 -  Pulse 86 88 85      Assessment and Plan:  URI Medication refills:  Will refill medications  Stay well hydrated  Stay active  Deep breathing exercises  May take tylenol or fever or pain  May take mucinex twice daily  Will order respiratory panel - patient to come by the office today to have this completed   Follow up:  Follow up if needed    I discussed the assessment and treatment plan with the patient. The patient was provided an opportunity to ask questions and all were answered. The patient agreed with the plan and demonstrated an understanding of the instructions.   The patient was advised to call back or seek an in-person evaluation if the symptoms worsen or  if the condition fails to improve as anticipated.  I provided 23 minutes of non-face-to-face time during this encounter.   Fenton Foy, NP

## 2021-07-13 ENCOUNTER — Encounter: Payer: Self-pay | Admitting: Nurse Practitioner

## 2021-07-13 NOTE — Patient Instructions (Addendum)
URI Medication refills:  Will refill medications  Stay well hydrated  Stay active  Deep breathing exercises  May take tylenol or fever or pain  May take mucinex twice daily  Will order respiratory panel - patient to come by the office today to have this completed   Follow up:  Follow up if needed

## 2021-07-14 ENCOUNTER — Other Ambulatory Visit: Payer: Self-pay | Admitting: Nurse Practitioner

## 2021-07-14 ENCOUNTER — Encounter: Payer: Self-pay | Admitting: Nurse Practitioner

## 2021-07-14 ENCOUNTER — Other Ambulatory Visit: Payer: Self-pay

## 2021-07-14 LAB — COVID-19, FLU A+B AND RSV
Influenza A, NAA: NOT DETECTED
Influenza B, NAA: NOT DETECTED
RSV, NAA: DETECTED — AB
SARS-CoV-2, NAA: NOT DETECTED

## 2021-07-14 MED ORDER — AZITHROMYCIN 250 MG PO TABS
ORAL_TABLET | ORAL | 0 refills | Status: DC
  Filled 2021-07-14 (×2): qty 6, 5d supply, fill #0

## 2021-07-14 MED ORDER — BENZONATATE 100 MG PO CAPS
100.0000 mg | ORAL_CAPSULE | Freq: Two times a day (BID) | ORAL | 0 refills | Status: DC | PRN
  Filled 2021-07-14 (×2): qty 20, 10d supply, fill #0

## 2021-07-14 MED ORDER — NAPROXEN 500 MG PO TABS
500.0000 mg | ORAL_TABLET | Freq: Two times a day (BID) | ORAL | 0 refills | Status: DC
  Filled 2021-07-14 (×2): qty 28, 14d supply, fill #0

## 2021-07-14 MED ORDER — PREDNISONE 10 MG PO TABS
ORAL_TABLET | ORAL | 0 refills | Status: DC
  Filled 2021-07-14: qty 20, 8d supply, fill #0

## 2021-07-15 ENCOUNTER — Other Ambulatory Visit: Payer: Self-pay

## 2021-07-18 ENCOUNTER — Other Ambulatory Visit: Payer: Self-pay | Admitting: Nurse Practitioner

## 2021-07-18 MED ORDER — BENZONATATE 100 MG PO CAPS: 100.0000 mg | ORAL_CAPSULE | Freq: Two times a day (BID) | ORAL | 0 refills | Status: DC | PRN

## 2021-07-18 MED ORDER — PREDNISONE 10 MG PO TABS: ORAL_TABLET | ORAL | 0 refills | Status: DC

## 2021-07-18 MED ORDER — AZITHROMYCIN 250 MG PO TABS: ORAL_TABLET | ORAL | 0 refills | Status: AC

## 2021-07-18 MED ORDER — NAPROXEN 500 MG PO TABS: 500.0000 mg | ORAL_TABLET | Freq: Two times a day (BID) | ORAL | 0 refills | Status: AC

## 2021-07-21 ENCOUNTER — Other Ambulatory Visit: Payer: Self-pay

## 2021-09-02 ENCOUNTER — Ambulatory Visit: Payer: 59 | Admitting: Internal Medicine

## 2021-09-02 NOTE — Progress Notes (Incomplete)
Cardiology Office Note:    Date:  09/02/2021   ID:  Anne Reese, DOB 1976/01/21, MRN [id number]  PCP:  Anne Herter, NP   [facility] Providers Cardiologist:  Janina Mayo, MD     Referring MD: Anne Herter, NP   No chief complaint on file. Palpitations  History of Present Illness:    Anne Reese is a 46 y.o. female with  no periods since 2014,  migraines, here for palpitations   She notes for a few years she has had palpitations. She notes skipped heart beats or delayed. It happens every day. No recurrent LH or dizziness. Every once in awhile [facility] with standing. She denies syncope. No family hx of SCD. Stopped smoking for 20 years, 2 years ago moved back and smoking. Nicotine patches cannot afford. Down to 1 ppd. In the past she took wellbutrin which helped. She notes SOB with getting up suddenly, moving around. She can walk up a flight of stairs. She denies angina, dyspnea on exertion, lower extremity edema, PND or orthopnea.   TSH 1.5  A1c 4.2%  Cardiac Hx: no stress test, no echo, no heart LHC  Social hx: smoker  Family hx: Maternal GM/GF cardiac hx. Mother- no heart dx. No siblings. Three kids, healthy  Past Medical History: HTN- well controlled medications-norvasc 10 mg daily, migraines  Interim hx: Cardiac event monitor is still pending  Cardiology Studies: EKG 04/06/2021- NSR Prior Qtc 486 ms   Past Medical History:  Diagnosis Date   Asthma    no inhaler use x 15 yrs   Complication of anesthesia    asthma attack in PACU   Depression    Diverticulitis    Fibroids 2013   Hyperlipidemia    Hypertension     Past Surgical History:  Procedure Laterality Date   ABDOMINAL HYSTERECTOMY N/A 06/24/2013   Procedure: HYSTERECTOMY ABDOMINAL;  Surgeon: Betsy Coder, MD;  Location: [facility];  Service: Gynecology;  Laterality: N/A;   BILATERAL SALPINGECTOMY Bilateral 06/24/2013   Procedure: BILATERAL SALPINGECTOMY;  Surgeon: Betsy Coder, MD;  Location:  [place];  Service: Gynecology;  Laterality: Bilateral;   CYSTOSCOPY N/A 06/24/2013   Procedure: CYSTOSCOPY;  Surgeon: Betsy Coder, MD;  Location: [facility];  Service: Gynecology;  Laterality: N/A;   DILATION AND CURETTAGE OF UTERUS N/A 06/24/2013   Procedure: DILATATION AND CURETTAGE;  Surgeon: Betsy Coder, MD;  Location: [facility];  Service: Gynecology;  Laterality: N/A;   GANGLION CYST EXCISION Left    LAPAROSCOPY N/A 01/18/2020   Procedure: LAPAROSCOPY DIAGNOSTIC, WITH EVACTION OF HEMOPERITONEUM;  Surgeon: Sloan Leiter, MD;  Location: [facility];  Service: Gynecology;  Laterality: N/A;   TUBAL LIGATION      Current Medications: No outpatient medications have been marked as taking for the 09/02/21 encounter (Appointment) with Janina Mayo, MD.     Allergies:   Patient has no known allergies.   Social History   Socioeconomic History   Marital status: Married    Spouse name: Not on file   Number of children: 3   Years of education: Not on file   Highest education level: Not on file  Occupational History   Occupation: Administrator, arts  Tobacco Use   Smoking status: Every Day    Packs/day: 1.00    Types: Cigarettes   Smokeless tobacco: Never  Vaping Use   Vaping Use: Never used  Substance and Sexual Activity   Alcohol use: Yes  Comment: occasional   Drug use: No   Sexual activity: Yes    Partners: Female    Birth control/protection: Surgical    Comment: monogamous, 4 years, no toys  Other Topics Concern   Not on file  Social History Narrative   Not on file   Social Determinants of Health   Financial Resource Strain: Not on file  Food Insecurity: Not on file  Transportation Needs: Not on file  Physical Activity: Not on file  Stress: Not on file  Social Connections: Not on file     Family History: The patient's family history includes Brain cancer in an other family member; Breast cancer in an other family member; Colon cancer in her maternal grandfather; HIV in  her brother; Heart disease in her maternal grandmother; Hypertension in her maternal grandfather, maternal grandmother, and mother; Lupus in her daughter; Prostate cancer in her maternal grandfather; Sickle cell anemia in her maternal grandmother.  ROS:   Please see the history of present illness.     All other systems reviewed and are negative.  EKGs/Labs/Other Studies Reviewed:    The following studies were reviewed today:   EKG:  EKG is  ordered today.  The ekg ordered today demonstrates   NSR, QTc 454 ms  Recent Labs: 04/05/2021: ALT 18; BUN 14; Creatinine, Ser 0.94; Hemoglobin 12.9; Magnesium 2.0; Platelets 291; Potassium 3.4; Sodium 140  Recent Lipid Panel    Component Value Date/Time   CHOL 205 (H) 12/25/2019 1409   TRIG 200 (H) 12/25/2019 1409   HDL 42 12/25/2019 1409   CHOLHDL 4.9 (H) 12/25/2019 1409   LDLCALC 127 (H) 12/25/2019 1409     Risk Assessment/Calculations:     The 10-year ASCVD risk score (Arnett DK, et al., 2019) is: 6.1%   Values used to calculate the score:     Age: 75 years     Sex: Female     Is Non-Hispanic African American: Yes     Diabetic: No     Tobacco smoker: Yes     Systolic Blood Pressure: 433 mmHg     Is BP treated: Yes     HDL Cholesterol: 42 mg/dL     Total Cholesterol: 205 mg/dL       Physical Exam:    VS:  LMP 05/19/2013 Comment: Negative HCG in ED today    Wt Readings from Last 3 Encounters:  06/07/21 218 lb 9.6 oz (99.2 kg)  04/08/21 221 lb 6.4 oz (100.4 kg)  04/05/21 222 lb (100.7 kg)     GEN:  Well nourished, well developed in no acute distress HEENT: Normal NECK: No JVD; No carotid bruits CARDIAC: RRR, no murmurs, rubs, gallops RESPIRATORY:  Clear to auscultation without rales, wheezing or rhonchi  ABDOMEN: Soft, non-tender, non-distended MUSCULOSKELETAL:  No edema; No deformity  SKIN: Warm and dry NEUROLOGIC:  Alert and oriented x 3 PSYCHIATRIC:  Normal affect   ASSESSMENT:    #Palpitations: She does not  have concerning features, episodes are daily. Qtc mildly prolonged, < 500 ms. Will plan for a cardiac event monitor to assess for arrhythmia and follow up. If she has any abnormalities, will obtain TTE  for further workup.   #HTN: well controlled. Continue norvasc  #CVD risk modification: ASCVD 9.4%. on lipitor 40 mg.  #Smoking: referral to Avelino Leeds for support. She tried wellbutrin in the past and she liked it   PLAN:    In order of problems listed above:  Cardiac event monitor Follow up 3  months      Medication Adjustments/Labs and Tests Ordered: Current medicines are reviewed at length with the patient today.  Concerns regarding medicines are outlined above.    Signed, Janina Mayo, MD  09/02/2021 8:11 AM    [place]

## 2021-09-08 ENCOUNTER — Other Ambulatory Visit: Payer: Self-pay | Admitting: Nurse Practitioner

## 2021-09-08 DIAGNOSIS — I10 Essential (primary) hypertension: Secondary | ICD-10-CM

## 2021-09-09 NOTE — Telephone Encounter (Signed)
Patient is completely out of blood pressure medication, wondering if she can get a refill to last until her appt on Tues.

## 2021-09-10 NOTE — Progress Notes (Signed)
Erroneous encounter

## 2021-09-13 ENCOUNTER — Encounter: Payer: Self-pay | Admitting: Family

## 2021-10-24 ENCOUNTER — Other Ambulatory Visit: Payer: Self-pay | Admitting: Family

## 2021-10-24 ENCOUNTER — Telehealth: Payer: Self-pay | Admitting: Family

## 2021-10-24 DIAGNOSIS — E785 Hyperlipidemia, unspecified: Secondary | ICD-10-CM

## 2021-10-24 DIAGNOSIS — I10 Essential (primary) hypertension: Secondary | ICD-10-CM

## 2021-10-24 MED ORDER — ATORVASTATIN CALCIUM 40 MG PO TABS: 40.0000 mg | ORAL_TABLET | Freq: Every day | ORAL | 0 refills | Status: DC

## 2021-10-24 MED ORDER — AMLODIPINE BESYLATE 10 MG PO TABS: 10.0000 mg | ORAL_TABLET | Freq: Every day | ORAL | 0 refills | Status: DC

## 2021-10-24 NOTE — Telephone Encounter (Signed)
Pt has been scheduled for 10-27-21 for med refills. She is requesting a 30 day supply to go ahead and be sent in for her BP med and Chloestoral med, since she has not had them in about 5 days. Please send to [facility] on [address] ?

## 2021-10-24 NOTE — Telephone Encounter (Signed)
Complete

## 2021-10-25 ENCOUNTER — Telehealth: Payer: Self-pay

## 2021-10-25 NOTE — Telephone Encounter (Signed)
Patient is requesting amlodipine and atorvastatin be resent to [facility] on [address]. Due to pricing. Thanks.  ?

## 2021-10-26 ENCOUNTER — Telehealth: Payer: Self-pay | Admitting: Family

## 2021-10-26 NOTE — Telephone Encounter (Signed)
Pt requesting that her amlodipine and atorvastatin script be sent to [facility] instead of [facility] [place]. Pt stated that her medication is cheaper at [facility]. Pt is urgent for the change because she is a frequent blood donator and due to her BP being elevated she's unable to donate. 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 ?

## 2021-10-26 NOTE — Telephone Encounter (Signed)
Pt is going on day 7 without BP medication, pt called to report that she is still waiting for her medications to be called into [facility] on [address]  ?

## 2021-10-26 NOTE — Telephone Encounter (Signed)
Will ask CMA to call [facility] pharmacy to confirm that patient did not pickup prescription. Then can continue with resending to requested [place].  ? ?Call patient with the following update: ? ?Patient encouraged to keep appointment 10/27/2021. This is very important as patient has missed several appointments.

## 2021-10-26 NOTE — Telephone Encounter (Signed)
Pt has scheduled appt for 10/27/21, can update pharmacy at that time or obtain medication from [facility] that was sent on 10/24/21. Pt has hx of No shows after courtesy supply of medication has been sent into pharmacy. Last OV 04/08/21  ?

## 2021-10-26 NOTE — Telephone Encounter (Signed)
Please see prior notes. Pt says that RX's are cheaper at [facility]. Please resend. ?

## 2021-10-26 NOTE — Progress Notes (Signed)
Patient ID: Anne Reese, female    DOB: 03/17/1976  MRN: [id number]  CC: Hypertension Follow-Up  Subjective: Anne Reese is a 46 y.o. female who presents for hypertension follow-up.   Her concerns today include:  HYPERTENSION FOLLOW-UP: Doing well on current regimen. No side effects. No issues/concerns. Denies chest pain and shortness of breath. Reports several days without blood pressure medications caused higher than normal readings. She discovered this when trying to donate plasma and was unable to donate at that time as a result. She does not check blood pressures at home otherwise.   Recently health insurance plan changed from [facility] [employer] to [facility]. As a result co-pay for blood pressure medication was higher at [facility]. When she looked at [facility] found that medications were cheaper at [facility] and decided to switch pharmacies at that time. Reports she was able to pickup courtesy prescription sent 10/24/2021 from [facility] prior to today's appointment after receiving financial assistance from a family member. States she does want to keep [facility] pharmacy moving forward because she plans to switch to new health insurance carrier April 2023.  2. ANXIETY DEPRESSION: Requesting refills of Escitalopram. Having some relationship concerns but no significant events otherwise. Denies self-harm, suicidal ideations, homicidal ideations.   3. BILATERAL FEET PAIN: 4. RIGHT KNEE PAIN: 5. RIGHT HIP PAIN: Seeing Podiatry and Physical Therapy without feeling improved. Has not seen Orthopedics in the past. Open to referral but would like to wait until new health insurance begins April 2023 to see if they cover. Requesting refills of Meloxicam in the meantime.   Depression screen [facility] 2/9 10/27/2021 04/08/2021 12/06/2020 09/21/2020 03/17/2020  Decreased Interest 3 0 0 0 2  Down, Depressed, Hopeless 3 0 0 0 3  PHQ - 2 Score 6 0 0 0 5  Altered sleeping 3 - 0 - 3  Tired,  decreased energy 3 - 0 - 3  Change in appetite 3 - 0 - 3  Feeling bad or failure about yourself  2 - 0 - 1  Trouble concentrating 3 - 0 - 0  Moving slowly or fidgety/restless 2 - 0 - 0  Suicidal thoughts 0 - 0 - 0  PHQ-9 Score 22 - 0 - 15  Difficult doing work/chores Very difficult - Not difficult at all - -     Patient Active Problem List   Diagnosis Date Noted   Depression 12/24/2020   Fecal incontinence 03/17/2020   Diarrhea 03/17/2020   Status post surgery 01/19/2020   Ruptured cyst of left ovary 01/19/2020   Hemorrhagic cyst of left ovary 01/18/2020   Acute diverticulitis 05/03/2019   Plantar fasciitis of right foot 06/25/2018   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 90 tablet 0   levocetirizine (XYZAL) 5 MG tablet Take 1 tablet (5 mg total) by mouth at bedtime. 30 tablet 0   omeprazole (PRILOSEC) 10 MG capsule Take 1 capsule (10 mg total) by mouth daily. 90 capsule 0   SUMAtriptan (IMITREX) 25 MG tablet Take 1 tablet (25 mg total) by mouth at the start of the headache. May repeat in 2 hours x 1 if headache persists. Max of 2 tabs/24 hours 30 tablet 0   topiramate (TOPAMAX) 25 MG tablet Take 1 tablet (25 mg total) by  mouth at bedtime. 30 tablet 0   Vitamin D, Ergocalciferol, (DRISDOL) 1.25 MG (50000 UNIT) CAPS capsule Take 1 capsule (50,000 Units total) by mouth every 7 (seven) days. 12 capsule 1   No current facility-administered medications on file prior to visit.    No Known Allergies  Social History   Socioeconomic History   Marital status: Married    Spouse name: Not on file   Number of children: 3   Years of education: Not on file   Highest education level: Not on file  Occupational History   Occupation: Comptroller  Tobacco Use   Smoking status: Every Day     Packs/day: 1.00    Types: Cigarettes    Passive exposure: Current   Smokeless tobacco: Never  Vaping Use   Vaping Use: Never used  Substance and Sexual Activity   Alcohol use: Yes    Comment: occasional   Drug use: No   Sexual activity: Yes    Partners: Female    Birth control/protection: Surgical    Comment: monogamous, 4 years, no toys  Other Topics Concern   Not on file  Social History Narrative   Not on file   Social Determinants of Health   Financial Resource Strain: Not on file  Food Insecurity: Not on file  Transportation Needs: Not on file  Physical Activity: Not on file  Stress: Not on file  Social Connections: Not on file  Intimate Partner Violence: Not on file    Family History  Problem Relation Age of Onset   Hypertension Maternal Grandfather    Colon cancer Maternal Grandfather    Prostate cancer Maternal Grandfather    Hypertension Mother    Sickle cell anemia Maternal Grandmother    Hypertension Maternal Grandmother    Heart disease Maternal Grandmother    Breast cancer Other        maternal great aunt   Brain cancer Other        "                "       "   HIV Brother    Lupus Daughter     Past Surgical History:  Procedure Laterality Date   ABDOMINAL HYSTERECTOMY N/A 06/24/2013   Procedure: HYSTERECTOMY ABDOMINAL;  Surgeon: Michael Litter, MD;  Location: [facility];  Service: Gynecology;  Laterality: N/A;   BILATERAL SALPINGECTOMY Bilateral 06/24/2013   Procedure: BILATERAL SALPINGECTOMY;  Surgeon: Michael Litter, MD;  Location: [facility];  Service: Gynecology;  Laterality: Bilateral;   CYSTOSCOPY N/A 06/24/2013   Procedure: CYSTOSCOPY;  Surgeon: Michael Litter, MD;  Location: [facility];  Service: Gynecology;  Laterality: N/A;   DILATION AND CURETTAGE OF UTERUS N/A 06/24/2013   Procedure: DILATATION AND CURETTAGE;  Surgeon: Michael Litter, MD;  Location: [facility];  Service: Gynecology;  Laterality: N/A;   GANGLION CYST EXCISION Left    LAPAROSCOPY  N/A 01/18/2020   Procedure: LAPAROSCOPY DIAGNOSTIC, WITH EVACTION OF HEMOPERITONEUM;  Surgeon: Conan Bowens, MD;  Location: [facility];  Service: Gynecology;  Laterality: N/A;   TUBAL LIGATION      ROS: Review of Systems Negative except as stated above  PHYSICAL EXAM: BP 121/81 (BP Location: Left Arm, Patient Position: Sitting, Cuff Size: Normal)   Pulse 91   Temp 98.3 F (36.8 C)   Resp 18   Ht 5' 2.99" (1.6 m)   Wt 200 lb (90.7 kg)   LMP 05/19/2013 Comment: Negative HCG in ED  today  SpO2 98%   BMI 35.44 kg/m   Physical Exam HENT:     Head: Normocephalic and atraumatic.  Eyes:     Extraocular Movements: Extraocular movements intact.     Conjunctiva/sclera: Conjunctivae normal.     Pupils: Pupils are equal, round, and reactive to light.  Cardiovascular:     Rate and Rhythm: Normal rate and regular rhythm.     Pulses: Normal pulses.     Heart sounds: Normal heart sounds.  Pulmonary:     Effort: Pulmonary effort is normal.     Breath sounds: Normal breath sounds.  Musculoskeletal:     Cervical back: Normal range of motion and neck supple.  Neurological:     General: No focal deficit present.     Mental Status: She is alert and oriented to person, place, and time.  Psychiatric:        Mood and Affect: Mood normal.        Behavior: Behavior normal.    ASSESSMENT AND PLAN: 1. Essential (primary) hypertension: - Continue Amlodipine as prescribed.  - Counseled on blood pressure goal of less than 130/80, low-sodium, DASH diet, medication compliance, 150 minutes of moderate intensity exercise per week as tolerated. Discussed medication compliance, adverse effects. - Counseled on importance of keeping scheduled appointments of at least every 6 months or will be unable to provide courtesy refills per office policy. Patient verbalized understanding.  - Patient confirmed preference of keeping [facility] pharmacy. - Follow-up with primary provider in 6 months or sooner if  needed.   - amLODipine (NORVASC) 10 MG tablet; Take 1 tablet (10 mg total) by mouth daily.  Dispense: 90 tablet; Refill: 1  2. Anxiety and depression: - Patient denies thoughts of self-harm, suicidal ideations, homicidal ideations. - Continue Escitalopram as prescribed.  - Follow-up with primary provider in 4 months or sooner if needed. - escitalopram (LEXAPRO) 10 MG tablet; Take 1 tablet (10 mg total) by mouth daily.  Dispense: 30 tablet; Refill: 3  3. Chronic pain of right knee: 4. Acute right hip pain: 5. Plantar fasciitis, bilateral: - Patient established with Podiatry and encouraged to keep appointments.  - Recommendation for referral to Orthopedics for right hip pain and right knee pain. Patient declined presently as waiting on approval of new health insurance April 2023.  - Continue Meloxicam as prescribed.  - Follow-up with primary provider as scheduled. - meloxicam (MOBIC) 15 MG tablet; Take 1 tablet (15 mg total) by mouth daily.  Dispense: 30 tablet; Refill: 2    Patient was given the opportunity to ask questions.  Patient verbalized understanding of the plan and was able to repeat key elements of the plan. Patient was given clear instructions to go to Emergency Department or return to medical center if symptoms don't improve, worsen, or new problems develop.The patient verbalized understanding.    Requested Prescriptions   Signed Prescriptions Disp Refills   amLODipine (NORVASC) 10 MG tablet 90 tablet 1    Sig: Take 1 tablet (10 mg total) by mouth daily.   escitalopram (LEXAPRO) 10 MG tablet 30 tablet 3    Sig: Take 1 tablet (10 mg total) by mouth daily.   meloxicam (MOBIC) 15 MG tablet 30 tablet 2    Sig: Take 1 tablet (15 mg total) by mouth daily.    Return in about 6 months (around 04/29/2022) for Follow-Up or next available hypertension; tomorrow fasting labs.  Rema Fendt, NP

## 2021-10-27 ENCOUNTER — Ambulatory Visit (INDEPENDENT_AMBULATORY_CARE_PROVIDER_SITE_OTHER): Payer: BLUE CROSS/BLUE SHIELD | Admitting: Family

## 2021-10-27 ENCOUNTER — Encounter: Payer: Self-pay | Admitting: Family

## 2021-10-27 ENCOUNTER — Other Ambulatory Visit: Payer: Self-pay

## 2021-10-27 VITALS — BP 121/81 | HR 91 | Temp 98.3°F | Resp 18 | Ht 62.99 in | Wt 200.0 lb

## 2021-10-27 DIAGNOSIS — F419 Anxiety disorder, unspecified: Secondary | ICD-10-CM | POA: Diagnosis not present

## 2021-10-27 DIAGNOSIS — M25551 Pain in right hip: Secondary | ICD-10-CM

## 2021-10-27 DIAGNOSIS — M722 Plantar fascial fibromatosis: Secondary | ICD-10-CM

## 2021-10-27 DIAGNOSIS — I10 Essential (primary) hypertension: Secondary | ICD-10-CM

## 2021-10-27 DIAGNOSIS — M25561 Pain in right knee: Secondary | ICD-10-CM | POA: Diagnosis not present

## 2021-10-27 DIAGNOSIS — G8929 Other chronic pain: Secondary | ICD-10-CM

## 2021-10-27 DIAGNOSIS — F32A Depression, unspecified: Secondary | ICD-10-CM

## 2021-10-27 MED ORDER — AMLODIPINE BESYLATE 10 MG PO TABS: 10.0000 mg | ORAL_TABLET | Freq: Every day | ORAL | 1 refills | Status: DC

## 2021-10-27 MED ORDER — ESCITALOPRAM OXALATE 10 MG PO TABS: 10.0000 mg | ORAL_TABLET | Freq: Every day | ORAL | 3 refills | Status: DC

## 2021-10-27 MED ORDER — MELOXICAM 15 MG PO TABS: 15.0000 mg | ORAL_TABLET | Freq: Every day | ORAL | 2 refills | Status: DC

## 2021-10-27 NOTE — Progress Notes (Signed)
Pt presents for hypertension follow-up,  ?Pt has complaints of right knee and hip pain, bilateral foot pain  ?

## 2021-10-28 ENCOUNTER — Other Ambulatory Visit: Payer: 59

## 2021-10-28 DIAGNOSIS — R5383 Other fatigue: Secondary | ICD-10-CM

## 2021-10-28 DIAGNOSIS — E785 Hyperlipidemia, unspecified: Secondary | ICD-10-CM

## 2021-10-29 LAB — LIPID PANEL
Chol/HDL Ratio: 2.9 ratio (ref 0.0–4.4)
Cholesterol, Total: 124 mg/dL (ref 100–199)
HDL: 43 mg/dL (ref >39)
LDL Chol Calc (NIH): 55 mg/dL (ref 0–99)
Triglycerides: 153 mg/dL — ABNORMAL HIGH (ref 0–149)
VLDL Cholesterol Cal: 26 mg/dL (ref 5–40)

## 2021-10-29 LAB — VITAMIN D 25 HYDROXY (VIT D DEFICIENCY, FRACTURES): Vit D, 25-Hydroxy: 12.3 ng/mL — ABNORMAL LOW (ref 30.0–100.0)

## 2021-10-30 ENCOUNTER — Other Ambulatory Visit: Payer: Self-pay | Admitting: Family

## 2021-10-30 DIAGNOSIS — E559 Vitamin D deficiency, unspecified: Secondary | ICD-10-CM

## 2021-10-30 MED ORDER — VITAMIN D (ERGOCALCIFEROL) 1.25 MG (50000 UNIT) PO CAPS: 50000.0000 [IU] | ORAL_CAPSULE | ORAL | 0 refills | Status: DC

## 2021-10-30 NOTE — Progress Notes (Signed)
Cholesterol improved since 12 months ago. During recent office visit patient expressed wanting to discontinue cholesterol medication related to financial concerns. Since cholesterol has improved agreeable with trial of discontinuing cholesterol medication as of present. Encouraged to continue heart healthy low sodium diet and routine exercise as tolerated. Encouraged to recheck fasting cholesterol routinely.  ? ?Vitamin D remaining lower than normal. Continue prescribed supplement and  recheck levels in 12 weeks.

## 2021-11-14 NOTE — Telephone Encounter (Signed)
Pt called in has not picked my medications for 60 day supply. She says didn't have her isnurance at the time, but she does now and is going to contact pharmacy to see if its available to pick up today. ?

## 2021-11-14 NOTE — Telephone Encounter (Signed)
Pt contacted.

## 2022-01-27 ENCOUNTER — Other Ambulatory Visit: Payer: Self-pay | Admitting: Family

## 2022-01-27 DIAGNOSIS — E559 Vitamin D deficiency, unspecified: Secondary | ICD-10-CM

## 2022-01-27 NOTE — Telephone Encounter (Signed)
Requested medication (s) are due for refill today: Yes  Requested medication (s) are on the active medication list: Yes  Last refill:  10/30/21  Future visit scheduled: Yes  Notes to clinic:  See request.    Requested Prescriptions  Pending Prescriptions Disp Refills   Vitamin D, Ergocalciferol, (DRISDOL) 1.25 MG (50000 UNIT) CAPS capsule [Pharmacy Med Name: Vitamin D (Ergocalciferol) 1.25 MG (50000 UT) Oral Capsule] 12 capsule 0    Sig: TAKE ONE CAPSULE BY MOUTH EVERY 7 DAYS     Endocrinology:  Vitamins - Vitamin D Supplementation 2 Failed - 01/27/2022  9:13 AM      Failed - Manual Review: Route requests for 50,000 IU strength to the provider      Failed - Vitamin D in normal range and within 360 days    Vit D, 25-Hydroxy  Date Value Ref Range Status  10/28/2021 12.3 (L) 30.0 - 100.0 ng/mL Final    Comment:    Vitamin D deficiency has been defined by the [place] and an Endocrine Society practice guideline as a level of serum 25-OH vitamin D less than 20 ng/mL (1,2). The Endocrine Society went on to further define vitamin D insufficiency as a level between 21 and 29 ng/mL (2). 1. IOM (Institute of Medicine). 2010. Dietary reference    intakes for calcium and D. [place]: The    [employer]. 2. Holick MF, [place], Bischoff-Ferrari HA, et al.    Evaluation, treatment, and prevention of vitamin D    deficiency: an Endocrine Society clinical practice    guideline. JCEM. 2011 Jul; 96(7):1911-30.          Passed - Ca in normal range and within 360 days    Calcium  Date Value Ref Range Status  04/05/2021 9.2 8.9 - 10.3 mg/dL Final   Calcium, Ion  Date Value Ref Range Status  11/23/2012 1.17 1.12 - 1.23 mmol/L Final         Passed - Valid encounter within last 12 months    Recent Outpatient Visits           3 months ago Essential (primary) hypertension   Primary Care at [facility], Amy J, NP   6 months ago Flu-like  symptoms   Primary Care at [facility], Kriste Basque, NP   9 months ago Migraine without aura and without status migrainosus, not intractable   Primary Care at [facility], Amy J, NP   1 year ago Essential hypertension   Primary Care at [facility], Amy J, NP   1 year ago Essential hypertension   Primary Care at [facility], Flonnie Hailstone, NP       Future Appointments             In 3 months Camillia Herter, NP Primary Care at [facility]

## 2022-04-18 NOTE — Progress Notes (Deleted)
Patient ID: Anne Reese, female    DOB: Mar 18, 1976  MRN: [id number]  CC: Chronic Care Management   Subjective: Anne Reese is a 46 y.o. female who presents for chronic care management.   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 90 tablet 1   atorvastatin (LIPITOR) 40 MG tablet Take 1 tablet (40 mg total) by mouth daily. 90 tablet 0   escitalopram (LEXAPRO) 10 MG tablet Take 1 tablet (10 mg total) by mouth daily. 30 tablet 3   levocetirizine (XYZAL) 5 MG tablet Take 1 tablet (5 mg total) by mouth at bedtime. 30 tablet 0   meloxicam (MOBIC) 15 MG tablet Take 1 tablet (15 mg total) by mouth daily. 30 tablet 2   omeprazole (PRILOSEC) 10 MG capsule Take 1 capsule (10 mg total) by mouth daily. 90 capsule 0   SUMAtriptan (IMITREX) 25 MG tablet Take 1 tablet (25 mg total) by mouth at the start of the headache. May repeat in 2 hours x 1 if headache persists. Max of 2 tabs/24 hours 30 tablet 0   topiramate (TOPAMAX) 25 MG tablet Take 1 tablet (25 mg total) by mouth at bedtime. 30 tablet 0   Vitamin D, Ergocalciferol, (DRISDOL) 1.25 MG (50000 UNIT) CAPS capsule TAKE ONE CAPSULE BY MOUTH EVERY 7 DAYS 12 capsule 0   No current facility-administered medications on file prior to visit.    No Known Allergies  Social History   Socioeconomic History   Marital status: Married     Spouse name: Not on file   Number of children: 3   Years of education: Not on file   Highest education level: Not on file  Occupational History   Occupation: Administrator, arts  Tobacco Use   Smoking status: Every Day    Packs/day: 1.00    Types: Cigarettes    Passive exposure: Current   Smokeless tobacco: Never  Vaping Use   Vaping Use: Never used  Substance and Sexual Activity   Alcohol use: Yes    Comment: occasional   Drug use: No   Sexual activity: Yes    Partners: Female    Birth control/protection: Surgical    Comment: monogamous, 4 years, no toys  Other Topics Concern   Not on file  Social History Narrative   Not on file   Social Determinants of Health   Financial Resource Strain: Not on file  Food Insecurity: Food Insecurity Present (02/12/2020)   Hunger Vital Sign    Worried About Running Out of Food in the Last Year: Often true    Ran Out of Food in the Last Year: Often true  Transportation Needs: No Transportation Needs (02/12/2020)   PRAPARE - Hydrologist (Medical): No    Lack of Transportation (Non-Medical): No  Physical Activity: Not on file  Stress: Not  on file  Social Connections: Not on file  Intimate Partner Violence: Not on file    Family History  Problem Relation Age of Onset   Hypertension Maternal Grandfather    Colon cancer Maternal Grandfather    Prostate cancer Maternal Grandfather    Hypertension Mother    Sickle cell anemia Maternal Grandmother    Hypertension Maternal Grandmother    Heart disease Maternal Grandmother    Breast cancer Other        maternal great aunt   Brain cancer Other        "                "       "   HIV Brother    Lupus Daughter     Past Surgical History:  Procedure Laterality Date   ABDOMINAL HYSTERECTOMY N/A 06/24/2013   Procedure: HYSTERECTOMY ABDOMINAL;  Surgeon: Betsy Coder, MD;  Location: [facility];  Service: Gynecology;  Laterality: N/A;   BILATERAL SALPINGECTOMY  Bilateral 06/24/2013   Procedure: BILATERAL SALPINGECTOMY;  Surgeon: Betsy Coder, MD;  Location: [facility];  Service: Gynecology;  Laterality: Bilateral;   CYSTOSCOPY N/A 06/24/2013   Procedure: CYSTOSCOPY;  Surgeon: Betsy Coder, MD;  Location: [facility];  Service: Gynecology;  Laterality: N/A;   DILATION AND CURETTAGE OF UTERUS N/A 06/24/2013   Procedure: DILATATION AND CURETTAGE;  Surgeon: Betsy Coder, MD;  Location: [facility];  Service: Gynecology;  Laterality: N/A;   GANGLION CYST EXCISION Left    LAPAROSCOPY N/A 01/18/2020   Procedure: LAPAROSCOPY DIAGNOSTIC, WITH EVACTION OF HEMOPERITONEUM;  Surgeon: Sloan Leiter, MD;  Location: [facility];  Service: Gynecology;  Laterality: N/A;   TUBAL LIGATION      ROS: Review of Systems Negative except as stated above  PHYSICAL EXAM: LMP 05/19/2013 Comment: Negative HCG in ED today  Physical Exam  {female adult master:310786} {female adult master:310785}     Latest Ref Rng & Units 04/05/2021    5:34 PM 12/06/2020    4:56 PM 01/18/2020    1:16 PM  CMP  Glucose 70 - 99 mg/dL 87  91  96   BUN 6 - 20 mg/dL '14  14  10   '$ Creatinine 0.44 - 1.00 mg/dL 0.94  0.84  0.83   Sodium 135 - 145 mmol/L 140  142  138   Potassium 3.5 - 5.1 mmol/L 3.4  4.1  4.1   Chloride 98 - 111 mmol/L 107  107  106   CO2 22 - 32 mmol/L '25  20  21   '$ Calcium 8.9 - 10.3 mg/dL 9.2  9.1  9.1   Total Protein 6.5 - 8.1 g/dL 7.4   7.0   Total Bilirubin 0.3 - 1.2 mg/dL 0.7   0.8   Alkaline Phos 38 - 126 U/L 67   65   AST 15 - 41 U/L 21   16   ALT 0 - 44 U/L 18   16    Lipid Panel     Component Value Date/Time   CHOL 124 10/28/2021 0819   TRIG 153 (H) 10/28/2021 0819   HDL 43 10/28/2021 0819   CHOLHDL 2.9 10/28/2021 0819   LDLCALC 55 10/28/2021 0819    CBC    Component Value Date/Time   WBC 9.7 04/05/2021 1734   RBC 4.79 04/05/2021 1734   HGB 12.9 04/05/2021 1734   HGB 14.3 12/25/2019 1409   HCT 38.7 04/05/2021 1734   HCT 43.3 12/25/2019  1409   PLT 291  04/05/2021 1734   PLT 302 12/25/2019 1409   MCV 80.8 04/05/2021 1734   MCV 81 12/25/2019 1409   MCH 26.9 04/05/2021 1734   MCHC 33.3 04/05/2021 1734   RDW 14.2 04/05/2021 1734   RDW 13.8 12/25/2019 1409   LYMPHSABS 3.8 04/05/2021 1734   LYMPHSABS 2.2 12/25/2019 1409   MONOABS 0.7 04/05/2021 1734   EOSABS 0.2 04/05/2021 1734   EOSABS 0.2 12/25/2019 1409   BASOSABS 0.1 04/05/2021 1734   BASOSABS 0.1 12/25/2019 1409    ASSESSMENT AND PLAN:  There are no diagnoses linked to this encounter.   Patient was given the opportunity to ask questions.  Patient verbalized understanding of the plan and was able to repeat key elements of the plan. Patient was given clear instructions to go to Emergency Department or return to medical center if symptoms don't improve, worsen, or new problems develop.The patient verbalized understanding.   No orders of the defined types were placed in this encounter.    Requested Prescriptions    No prescriptions requested or ordered in this encounter    No follow-ups on file.  Camillia Herter, NP

## 2022-04-27 ENCOUNTER — Ambulatory Visit: Payer: 59 | Admitting: Family

## 2022-04-27 DIAGNOSIS — Z131 Encounter for screening for diabetes mellitus: Secondary | ICD-10-CM

## 2022-04-27 DIAGNOSIS — F32A Depression, unspecified: Secondary | ICD-10-CM

## 2022-04-27 DIAGNOSIS — I10 Essential (primary) hypertension: Secondary | ICD-10-CM

## 2022-04-27 DIAGNOSIS — E785 Hyperlipidemia, unspecified: Secondary | ICD-10-CM

## 2022-05-02 ENCOUNTER — Other Ambulatory Visit: Payer: Self-pay | Admitting: Family

## 2022-05-02 DIAGNOSIS — I10 Essential (primary) hypertension: Secondary | ICD-10-CM

## 2022-05-02 DIAGNOSIS — F419 Anxiety disorder, unspecified: Secondary | ICD-10-CM

## 2022-05-02 NOTE — Telephone Encounter (Signed)
Requested medications are due for refill today.  yes  Requested medications are on the active medications list.  yes  Last refill. Amlodipine 10/27/2021 #90 1 rf, Lexapro 10/27/2021 #30 3 refills.  Future visit scheduled.   yes  Notes to clinic.  Both Rxs were written with expiration dates. Both rxs are expired.    Requested Prescriptions  Pending Prescriptions Disp Refills   amLODipine (NORVASC) 10 MG tablet 90 tablet 1    Sig: Take 1 tablet (10 mg total) by mouth daily.     Cardiovascular: Calcium Channel Blockers 2 Failed - 05/02/2022 10:45 AM      Failed - Valid encounter within last 6 months    Recent Outpatient Visits           6 months ago Essential (primary) hypertension   Primary Care at [facility], Amy J, NP   9 months ago Flu-like symptoms   Primary Care at [facility], Kriste Basque, NP   1 year ago Migraine without aura and without status migrainosus, not intractable   Primary Care at [facility], Amy J, NP   1 year ago Essential hypertension   Primary Care at [facility], Amy J, NP   1 year ago Essential hypertension   Primary Care at [facility] [place], NP       Future Appointments             In 2 weeks Camillia Herter, NP Primary Care at [facility] - Last BP in normal range    BP Readings from Last 1 Encounters:  10/27/21 121/81         Passed - Last Heart Rate in normal range    Pulse Readings from Last 1 Encounters:  10/27/21 91          escitalopram (LEXAPRO) 10 MG tablet 30 tablet 3    Sig: Take 1 tablet (10 mg total) by mouth daily.     Psychiatry:  Antidepressants - SSRI Failed - 05/02/2022 10:45 AM      Failed - Valid encounter within last 6 months    Recent Outpatient Visits           6 months ago Essential (primary) hypertension   Primary Care at [facility], Amy J, NP   9 months ago Flu-like symptoms   Primary Care at [facility], Kriste Basque, NP   1 year ago Migraine without aura and without status migrainosus, not intractable   Primary Care at [facility], Amy J, NP   1 year ago Essential hypertension   Primary Care at [facility], Amy J, NP   1 year ago Essential hypertension   Primary Care at [facility], Flonnie Hailstone, NP       Future Appointments             In 2 weeks Camillia Herter, NP Primary Care at [facility] - Completed PHQ-2 or PHQ-9 in the last 360 days

## 2022-05-02 NOTE — Telephone Encounter (Signed)
Medication Refill - Medication: amLODipine (NORVASC) 10 MG tablet, escitalopram (LEXAPRO) 10 MG tablet  Has the patient contacted their pharmacy? Yes.     Preferred Pharmacy (with phone number or street name):  [facility] [address] Phone:  [phone]  Fax:  [phone]     Has the patient been seen for an appointment in the last year OR does the patient have an upcoming appointment? Yes.    The patient has been without both meds for a couple of months due to insurance issues. Please assist patient further

## 2022-05-05 MED ORDER — ESCITALOPRAM OXALATE 10 MG PO TABS: 10.0000 mg | ORAL_TABLET | Freq: Every day | ORAL | 0 refills | Status: DC

## 2022-05-05 MED ORDER — AMLODIPINE BESYLATE 10 MG PO TABS: 10.0000 mg | ORAL_TABLET | Freq: Every day | ORAL | 0 refills | Status: DC

## 2022-05-08 NOTE — Progress Notes (Signed)
Erroneous encounter-disregard

## 2022-05-16 ENCOUNTER — Encounter: Payer: Self-pay | Admitting: Family

## 2022-05-16 DIAGNOSIS — F419 Anxiety disorder, unspecified: Secondary | ICD-10-CM

## 2022-05-16 DIAGNOSIS — E785 Hyperlipidemia, unspecified: Secondary | ICD-10-CM

## 2022-05-16 DIAGNOSIS — Z131 Encounter for screening for diabetes mellitus: Secondary | ICD-10-CM

## 2022-05-16 DIAGNOSIS — I10 Essential (primary) hypertension: Secondary | ICD-10-CM

## 2022-06-02 NOTE — Progress Notes (Unsigned)
Patient ID: BAILEI BUIST, female    DOB: 25-Apr-1976  MRN: [id number]  CC: No chief complaint on file.   Subjective: Anne Reese is a 46 y.o. 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 30 tablet 0   atorvastatin (LIPITOR) 40 MG tablet Take 1 tablet (40 mg total) by mouth daily. 90 tablet 0   escitalopram (LEXAPRO) 10 MG tablet Take 1 tablet (10 mg total) by mouth daily. 30 tablet 0   levocetirizine (XYZAL) 5 MG tablet Take 1 tablet (5 mg total) by mouth at bedtime. 30 tablet 0   meloxicam (MOBIC) 15 MG tablet Take 1 tablet (15 mg total) by mouth daily. 30 tablet 2   omeprazole (PRILOSEC) 10 MG capsule Take 1 capsule (10 mg total) by mouth daily. 90 capsule 0   SUMAtriptan (IMITREX) 25 MG tablet Take 1 tablet (25 mg total) by mouth at the start of the headache. May repeat in 2 hours x 1 if headache persists. Max of 2 tabs/24 hours 30 tablet 0   topiramate (TOPAMAX) 25 MG tablet Take 1 tablet (25 mg total) by mouth at bedtime. 30 tablet 0   Vitamin D, Ergocalciferol, (DRISDOL) 1.25 MG (50000 UNIT) CAPS capsule TAKE ONE CAPSULE BY MOUTH EVERY 7 DAYS 12 capsule 0   No current facility-administered medications on file prior to visit.    No Known Allergies  Social History   Socioeconomic History   Marital status:  Married    Spouse name: Not on file   Number of children: 3   Years of education: Not on file   Highest education level: Not on file  Occupational History   Occupation: Administrator, arts  Tobacco Use   Smoking status: Every Day    Packs/day: 1.00    Types: Cigarettes    Passive exposure: Current   Smokeless tobacco: Never  Vaping Use   Vaping Use: Never used  Substance and Sexual Activity   Alcohol use: Yes    Comment: occasional   Drug use: No   Sexual activity: Yes    Partners: Female    Birth control/protection: Surgical    Comment: monogamous, 4 years, no toys  Other Topics Concern   Not on file  Social History Narrative   Not on file   Social Determinants of Health   Financial Resource Strain: Not on file  Food Insecurity: Food Insecurity Present (02/12/2020)   Hunger Vital Sign    Worried About Running Out of Food in the Last Year: Often true    Ran Out of Food in the Last Year: Often true  Transportation Needs: No Transportation Needs (02/12/2020)   PRAPARE - Hydrologist (Medical): No    Lack of Transportation (Non-Medical): No  Physical Activity: Not on file  Stress: Not on file  Social Connections: Not on file  Intimate Partner Violence: Not on file    Family History  Problem Relation Age of Onset   Hypertension Maternal Grandfather    Colon cancer Maternal Grandfather    Prostate cancer Maternal Grandfather    Hypertension Mother    Sickle cell anemia Maternal Grandmother    Hypertension Maternal Grandmother    Heart disease Maternal Grandmother    Breast cancer Other        maternal great aunt   Brain cancer Other        "                "       "   HIV Brother    Lupus Daughter     Past Surgical History:  Procedure Laterality Date   ABDOMINAL HYSTERECTOMY N/A 06/24/2013   Procedure: HYSTERECTOMY ABDOMINAL;  Surgeon: Betsy Coder, MD;  Location: [facility];  Service: Gynecology;  Laterality: N/A;   BILATERAL  SALPINGECTOMY Bilateral 06/24/2013   Procedure: BILATERAL SALPINGECTOMY;  Surgeon: Betsy Coder, MD;  Location: [facility];  Service: Gynecology;  Laterality: Bilateral;   CYSTOSCOPY N/A 06/24/2013   Procedure: CYSTOSCOPY;  Surgeon: Betsy Coder, MD;  Location: [facility];  Service: Gynecology;  Laterality: N/A;   DILATION AND CURETTAGE OF UTERUS N/A 06/24/2013   Procedure: DILATATION AND CURETTAGE;  Surgeon: Betsy Coder, MD;  Location: [facility];  Service: Gynecology;  Laterality: N/A;   GANGLION CYST EXCISION Left    LAPAROSCOPY N/A 01/18/2020   Procedure: LAPAROSCOPY DIAGNOSTIC, WITH EVACTION OF HEMOPERITONEUM;  Surgeon: Sloan Leiter, MD;  Location: [facility];  Service: Gynecology;  Laterality: N/A;   TUBAL LIGATION      ROS: Review of Systems Negative except as stated above  PHYSICAL EXAM: LMP 05/19/2013 Comment: Negative HCG in ED today  Physical Exam  {female adult master:310786} {female adult master:310785}     Latest Ref Rng & Units 04/05/2021    5:34 PM 12/06/2020    4:56 PM 01/18/2020    1:16 PM  CMP  Glucose 70 - 99 mg/dL 87  91  96   BUN 6 - 20 mg/dL '14  14  10   '$ Creatinine 0.44 - 1.00 mg/dL 0.94  0.84  0.83   Sodium 135 - 145 mmol/L 140  142  138   Potassium 3.5 - 5.1 mmol/L 3.4  4.1  4.1   Chloride 98 - 111 mmol/L 107  107  106   CO2 22 - 32 mmol/L '25  20  21   '$ Calcium 8.9 - 10.3 mg/dL 9.2  9.1  9.1   Total Protein 6.5 - 8.1 g/dL 7.4   7.0   Total Bilirubin 0.3 - 1.2 mg/dL 0.7   0.8   Alkaline Phos 38 - 126 U/L 67   65   AST 15 - 41 U/L 21   16   ALT 0 - 44 U/L 18   16    Lipid Panel     Component Value Date/Time   CHOL 124 10/28/2021 0819   TRIG 153 (H) 10/28/2021 0819   HDL 43 10/28/2021 0819   CHOLHDL 2.9 10/28/2021 0819   LDLCALC 55 10/28/2021 0819    CBC    Component Value Date/Time   WBC 9.7 04/05/2021 1734   RBC 4.79 04/05/2021 1734   HGB 12.9 04/05/2021 1734   HGB 14.3 12/25/2019 1409   HCT 38.7 04/05/2021 1734   HCT  43.3 12/25/2019 1409   PLT  291 04/05/2021 1734   PLT 302 12/25/2019 1409   MCV 80.8 04/05/2021 1734   MCV 81 12/25/2019 1409   MCH 26.9 04/05/2021 1734   MCHC 33.3 04/05/2021 1734   RDW 14.2 04/05/2021 1734   RDW 13.8 12/25/2019 1409   LYMPHSABS 3.8 04/05/2021 1734   LYMPHSABS 2.2 12/25/2019 1409   MONOABS 0.7 04/05/2021 1734   EOSABS 0.2 04/05/2021 1734   EOSABS 0.2 12/25/2019 1409   BASOSABS 0.1 04/05/2021 1734   BASOSABS 0.1 12/25/2019 1409    ASSESSMENT AND PLAN:  There are no diagnoses linked to this encounter.   Patient was given the opportunity to ask questions.  Patient verbalized understanding of the plan and was able to repeat key elements of the plan. Patient was given clear instructions to go to Emergency Department or return to medical center if symptoms don't improve, worsen, or new problems develop.The patient verbalized understanding.   No orders of the defined types were placed in this encounter.    Requested Prescriptions    No prescriptions requested or ordered in this encounter    No follow-ups on file.  Camillia Herter, NP

## 2022-06-08 ENCOUNTER — Ambulatory Visit (INDEPENDENT_AMBULATORY_CARE_PROVIDER_SITE_OTHER): Payer: Commercial Managed Care - HMO | Admitting: Family

## 2022-06-08 ENCOUNTER — Encounter: Payer: Self-pay | Admitting: Family

## 2022-06-08 VITALS — BP 130/84 | HR 89 | Temp 98.3°F | Resp 16 | Ht 62.99 in | Wt 195.0 lb

## 2022-06-08 DIAGNOSIS — M25562 Pain in left knee: Secondary | ICD-10-CM

## 2022-06-08 DIAGNOSIS — Z6379 Other stressful life events affecting family and household: Secondary | ICD-10-CM

## 2022-06-08 DIAGNOSIS — F17200 Nicotine dependence, unspecified, uncomplicated: Secondary | ICD-10-CM

## 2022-06-08 DIAGNOSIS — I10 Essential (primary) hypertension: Secondary | ICD-10-CM

## 2022-06-08 DIAGNOSIS — Z789 Other specified health status: Secondary | ICD-10-CM

## 2022-06-08 DIAGNOSIS — Z131 Encounter for screening for diabetes mellitus: Secondary | ICD-10-CM

## 2022-06-08 DIAGNOSIS — Z809 Family history of malignant neoplasm, unspecified: Secondary | ICD-10-CM

## 2022-06-08 DIAGNOSIS — Z122 Encounter for screening for malignant neoplasm of respiratory organs: Secondary | ICD-10-CM

## 2022-06-08 DIAGNOSIS — F419 Anxiety disorder, unspecified: Secondary | ICD-10-CM | POA: Diagnosis not present

## 2022-06-08 DIAGNOSIS — M25561 Pain in right knee: Secondary | ICD-10-CM

## 2022-06-08 DIAGNOSIS — E785 Hyperlipidemia, unspecified: Secondary | ICD-10-CM | POA: Diagnosis not present

## 2022-06-08 DIAGNOSIS — Z8489 Family history of other specified conditions: Secondary | ICD-10-CM

## 2022-06-08 DIAGNOSIS — Z800 Family history of malignant neoplasm of digestive organs: Secondary | ICD-10-CM

## 2022-06-08 DIAGNOSIS — F32A Depression, unspecified: Secondary | ICD-10-CM

## 2022-06-08 DIAGNOSIS — G8929 Other chronic pain: Secondary | ICD-10-CM

## 2022-06-08 MED ORDER — ESCITALOPRAM OXALATE 20 MG PO TABS: 20.0000 mg | ORAL_TABLET | Freq: Every day | ORAL | 2 refills | Status: AC

## 2022-06-08 MED ORDER — BUSPIRONE HCL 5 MG PO TABS: 5.0000 mg | ORAL_TABLET | Freq: Every day | ORAL | 0 refills | Status: DC

## 2022-06-08 MED ORDER — MELOXICAM 15 MG PO TABS: 15.0000 mg | ORAL_TABLET | Freq: Every day | ORAL | 2 refills | Status: AC

## 2022-06-08 MED ORDER — ATORVASTATIN CALCIUM 40 MG PO TABS: 40.0000 mg | ORAL_TABLET | Freq: Every day | ORAL | 2 refills | Status: AC

## 2022-06-08 MED ORDER — AMLODIPINE BESYLATE 10 MG PO TABS: 10.0000 mg | ORAL_TABLET | Freq: Every day | ORAL | 2 refills | Status: DC

## 2022-06-08 NOTE — Progress Notes (Signed)
.  Pt presents for hypertension f/u   -wants some type of cancer screening due to mother in hospital dying from stage 4 cancer - needs Escitalopram and Amlodipine refills sent into [facility]  - PHQ-9=22 extremely difficult and anxiety due to mother current condition

## 2022-06-12 LAB — HEMOGLOBIN A1C
Est. average glucose Bld gHb Est-mCnc: <74 mg/dL
Hgb A1c MFr Bld: <4.2 % — ABNORMAL LOW (ref 4.8–5.6)

## 2022-06-12 LAB — CMP14+EGFR
ALT: 17 IU/L (ref 0–32)
AST: 19 IU/L (ref 0–40)
Albumin/Globulin Ratio: 1.9 (ref 1.2–2.2)
Albumin: 4.3 g/dL (ref 3.9–4.9)
Alkaline Phosphatase: 60 IU/L (ref 44–121)
BUN/Creatinine Ratio: 8 — ABNORMAL LOW (ref 9–23)
BUN: 6 mg/dL (ref 6–24)
Bilirubin Total: 0.7 mg/dL (ref 0.0–1.2)
CO2: 22 mmol/L (ref 20–29)
Calcium: 9.5 mg/dL (ref 8.7–10.2)
Chloride: 105 mmol/L (ref 96–106)
Creatinine, Ser: 0.76 mg/dL (ref 0.57–1.00)
Globulin, Total: 2.3 g/dL (ref 1.5–4.5)
Glucose: 80 mg/dL (ref 70–99)
Potassium: 4.2 mmol/L (ref 3.5–5.2)
Sodium: 143 mmol/L (ref 134–144)
Total Protein: 6.6 g/dL (ref 6.0–8.5)
eGFR: 98 mL/min/{1.73_m2} (ref >59)

## 2022-07-04 ENCOUNTER — Encounter (HOSPITAL_COMMUNITY): Payer: Commercial Managed Care - HMO | Admitting: Psychiatry

## 2022-07-04 ENCOUNTER — Encounter (HOSPITAL_COMMUNITY): Payer: Self-pay

## 2022-07-04 NOTE — Progress Notes (Deleted)
Psychiatric Initial Adult Assessment   Patient Identification: Anne Reese MRN:  [id number] Date of Evaluation:  07/04/2022 Referral Source: PCP Chief Complaint:  No chief complaint on file.  Visit Diagnosis: No diagnosis found.   Assessment:  Anne Reese is a 46 y.o. female with a history of depression and alcohol use disorder who presents virtually to [place] at [facility] for initial evaluation on 07/04/2022.  Patient reports ***  A number of assessments were performed during the evaluation today including nutritional assessment which was ***, pain assessment which showed ***, PHQ-9 which they scored a *** on, GAD-7 which they scored a *** on, and [place] suicide severity screening which showed ***.  Based on these assessments patient would benefit from medication adjustment to better target their symptoms.  Plan: - Lexapro 20 mg QD - Buspar 5 mg QD - Topamax 25 mg QD - sumatriptan 25 mg BID prn for headache - CBC, CMP, Lipid panel, Vit D, TSH, A1C reviewed - Vit D low and patient is on replacement therapy managed by PCP - EKG reviewed QTC 454 on 06/07/21 - Referred to therapy by PCP - Crisis resources reviewed - Follow up in  History of Present Illness:  ***   Taking Lexapro but doesn't feel it is helping as before. Reports increased anxiety, depression, and stress related to her mother's diagnosis. States she doesn't know exactly what kind of cancer her mother has. However, what she does know for certain is that her mother has tumors in her stomach. States she wants to get checked for stomach-related cancers and lung cancer. States she quit consuming alcohol, smoking cigarettes, and substance use 20 years ago. She was attending regular AA meetings at that time. Reports she eventually quit attending [place] meetings because she didn't feel it was beneficial any longer. She resumed alcohol consumption and smoking cigarettes 3 years ago. Reports she would  like to begin [place] meetings again. Currently consumes about two to three 12 packs of beer weekly and realizes this may affect her liver. Smoking about 1 pack daily of cigarettes. She is trying to cut back on smoking cigarettes but challenging due to increased stress. She would eventually like to quit someday. Denies thoughts of self-harm, suicidal ideations, and homicidal ideations.    Associated Signs/Symptoms: Depression Symptoms:  {DEPRESSION SYMPTOMS:20000} (Hypo) Manic Symptoms:  {BHH MANIC SYMPTOMS:22872} Anxiety Symptoms:  {BHH ANXIETY SYMPTOMS:22873} Psychotic Symptoms:  {BHH PSYCHOTIC SYMPTOMS:22874} PTSD Symptoms: {BHH PTSD SYMPTOMS:22875}  Past Psychiatric History: ***  Previous Psychotropic Medications: {YES/NO:21197}  Substance Abuse History in the last 12 months:  {yes no:314532}  Consequences of Substance Abuse: {BHH CONSEQUENCES OF SUBSTANCE ABUSE:22880}  Past Medical History:  Past Medical History:  Diagnosis Date   Asthma    no inhaler use x 15 yrs   Complication of anesthesia    asthma attack in PACU   Depression    Diverticulitis    Fibroids 2013   Hyperlipidemia    Hypertension     Past Surgical History:  Procedure Laterality Date   ABDOMINAL HYSTERECTOMY N/A 06/24/2013   Procedure: HYSTERECTOMY ABDOMINAL;  Surgeon: Betsy Coder, MD;  Location: [facility];  Service: Gynecology;  Laterality: N/A;   BILATERAL SALPINGECTOMY Bilateral 06/24/2013   Procedure: BILATERAL SALPINGECTOMY;  Surgeon: Betsy Coder, MD;  Location: [facility];  Service: Gynecology;  Laterality: Bilateral;   CYSTOSCOPY N/A 06/24/2013   Procedure: CYSTOSCOPY;  Surgeon: Betsy Coder, MD;  Location: [facility];  Service: Gynecology;  Laterality: N/A;  DILATION AND CURETTAGE OF UTERUS N/A 06/24/2013   Procedure: DILATATION AND CURETTAGE;  Surgeon: Betsy Coder, MD;  Location: [facility];  Service: Gynecology;  Laterality: N/A;   GANGLION CYST EXCISION Left    LAPAROSCOPY N/A 01/18/2020    Procedure: LAPAROSCOPY DIAGNOSTIC, WITH EVACTION OF HEMOPERITONEUM;  Surgeon: Sloan Leiter, MD;  Location: [facility];  Service: Gynecology;  Laterality: N/A;   TUBAL LIGATION      Family Psychiatric History: ***  Family History:  Family History  Problem Relation Age of Onset   Hypertension Maternal Grandfather    Colon cancer Maternal Grandfather    Prostate cancer Maternal Grandfather    Hypertension Mother    Sickle cell anemia Maternal Grandmother    Hypertension Maternal Grandmother    Heart disease Maternal Grandmother    Breast cancer Other        maternal great aunt   Brain cancer Other        "                "       "   HIV Brother    Lupus Daughter     Social History:   Social History   Socioeconomic History   Marital status: Married    Spouse name: Not on file   Number of children: 3   Years of education: Not on file   Highest education level: Not on file  Occupational History   Occupation: Administrator, arts  Tobacco Use   Smoking status: Every Day    Packs/day: 1.00    Types: Cigarettes    Passive exposure: Current   Smokeless tobacco: Never  Vaping Use   Vaping Use: Never used  Substance and Sexual Activity   Alcohol use: Yes    Comment: occasional   Drug use: No   Sexual activity: Yes    Partners: Female    Birth control/protection: Surgical    Comment: monogamous, 4 years, no toys  Other Topics Concern   Not on file  Social History Narrative   Not on file   Social Determinants of Health   Financial Resource Strain: Not on file  Food Insecurity: Food Insecurity Present (02/12/2020)   Hunger Vital Sign    Worried About Running Out of Food in the Last Year: Often true    Ran Out of Food in the Last Year: Often true  Transportation Needs: No Transportation Needs (02/12/2020)   PRAPARE - Hydrologist (Medical): No    Lack of Transportation (Non-Medical): No  Physical Activity: Not on file  Stress: Not on file  Social  Connections: Not on file    Additional Social History: ***  Allergies:  No Known Allergies  Metabolic Disorder Labs: Lab Results  Component Value Date   HGBA1C <4.2 (L) 06/08/2022   No results found for: "PROLACTIN" Lab Results  Component Value Date   CHOL 124 10/28/2021   TRIG 153 (H) 10/28/2021   HDL 43 10/28/2021   CHOLHDL 2.9 10/28/2021   LDLCALC 55 10/28/2021   LDLCALC 127 (H) 12/25/2019   Lab Results  Component Value Date   TSH 1.54 03/17/2020    Therapeutic Level Labs: No results found for: "LITHIUM" No results found for: "CBMZ" No results found for: "VALPROATE"  Current Medications: Current Outpatient Medications  Medication Sig Dispense Refill   amLODipine (NORVASC) 10 MG tablet Take 1 tablet (10 mg total) by mouth daily. 30 tablet 2   atorvastatin (LIPITOR) 40  MG tablet Take 1 tablet (40 mg total) by mouth daily. 30 tablet 2   busPIRone (BUSPAR) 5 MG tablet Take 1 tablet (5 mg total) by mouth daily. 30 tablet 0   escitalopram (LEXAPRO) 20 MG tablet Take 1 tablet (20 mg total) by mouth daily. 30 tablet 2   levocetirizine (XYZAL) 5 MG tablet Take 1 tablet (5 mg total) by mouth at bedtime. 30 tablet 0   meloxicam (MOBIC) 15 MG tablet Take 1 tablet (15 mg total) by mouth daily. 30 tablet 2   omeprazole (PRILOSEC) 10 MG capsule Take 1 capsule (10 mg total) by mouth daily. 90 capsule 0   SUMAtriptan (IMITREX) 25 MG tablet Take 1 tablet (25 mg total) by mouth at the start of the headache. May repeat in 2 hours x 1 if headache persists. Max of 2 tabs/24 hours 30 tablet 0   topiramate (TOPAMAX) 25 MG tablet Take 1 tablet (25 mg total) by mouth at bedtime. 30 tablet 0   Vitamin D, Ergocalciferol, (DRISDOL) 1.25 MG (50000 UNIT) CAPS capsule TAKE ONE CAPSULE BY MOUTH EVERY 7 DAYS 12 capsule 0   No current facility-administered medications for this visit.    Psychiatric Specialty Exam: Review of Systems  Last menstrual period 05/19/2013.There is no height or weight on  file to calculate BMI.  General Appearance: {Appearance:22683}  Eye Contact:  {BHH EYE CONTACT:22684}  Speech:  {Speech:22685}  Volume:  {Volume (PAA):22686}  Mood:  {BHH MOOD:22306}  Affect:  {Affect (PAA):22687}  Thought Process:  {Thought Process (PAA):22688}  Orientation:  {BHH ORIENTATION (PAA):22689}  Thought Content:  {Thought Content:22690}  Suicidal Thoughts:  {ST/HT (PAA):22692}  Homicidal Thoughts:  {ST/HT (PAA):22692}  Memory:  {BHH MEMORY:22881}  Judgement:  {Judgement (PAA):22694}  Insight:  {Insight (PAA):22695}  Psychomotor Activity:  {Psychomotor (PAA):22696}  Concentration:  {Concentration:21399}  Recall:  {BHH GOOD/FAIR/POOR:22877}  Fund of Knowledge:{BHH GOOD/FAIR/POOR:22877}  Language: {BHH GOOD/FAIR/POOR:22877}  Akathisia:  {BHH YES OR NO:22294}    AIMS (if indicated):  {Desc; done/not:10129}  Assets:  {Assets (PAA):22698}  ADL's:  {BHH LKG'M:01027}  Cognition: {chl bhh cognition:304700322}  Sleep:  {BHH GOOD/FAIR/POOR:22877}   Screenings: GAD-7    Flowsheet Row Office Visit from 06/08/2022 in Primary Care at [place] Visit from 03/17/2020 in [place] Office Visit from 02/12/2020 in [facility] [place] [facility] Tele[facility]icine from 12/22/2019 in [place] Office Visit from 08/27/2019 in [place]  Total GAD-7 Score '18 14 6 11 12      '$ PHQ2-9    [place] Office Visit from 06/08/2022 in Primary Care at [place] Visit from 10/27/2021 in Primary Care at [place] Visit from 04/08/2021 in [place] at [facility] Visit from 12/06/2020 in Primary Care at [place] from 09/21/2020 in Primary Care at [facility] Total Score 6 6 0 0 0  PHQ-9 Total Score 22 22 -- 0 --      Flowsheet Row ED from 04/05/2021 in [place] DEPT Most recent  reading at 04/05/2021  4:50 PM ED from 04/05/2021 in [facility] at [facility]  Most recent reading at 04/05/2021  2:34 PM  C-SSRS RISK CATEGORY No Risk No Risk        Collaboration of Care: {BH OP Collaboration of Care:21014065}  Patient/Guardian was advised Release of Information must be obtained prior to any record release in order to  collaborate their care with an outside provider. Patient/Guardian was advised if they have not already done so to contact the registration department to sign all necessary forms in order for [place] to release information regarding their care.   Consent: Patient/Guardian gives verbal consent for treatment and assignment of benefits for services provided during this visit. Patient/Guardian expressed understanding and agreed to proceed.   Vista Mink, MD 11/21/20238:27 AM    Virtual Visit via Video Note  I connected with Anne Reese on 07/04/22 at  1:00 PM EST by a video enabled telemedicine application and verified that I am speaking with the correct person using two identifiers.  Location: Patient: Home Provider: Home office   I discussed the limitations of evaluation and management by telemedicine and the availability of in person appointments. The patient expressed understanding and agreed to proceed.   I discussed the assessment and treatment plan with the patient. The patient was provided an opportunity to ask questions and all were answered. The patient agreed with the plan and demonstrated an understanding of the instructions.   The patient was advised to call back or seek an in-person evaluation if the symptoms worsen or if the condition fails to improve as anticipated.  I provided *** minutes of non-face-to-face time during this encounter.   Vista Mink, MD

## 2022-07-04 NOTE — Progress Notes (Signed)
This encounter was created in error - please disregard.

## 2022-07-10 ENCOUNTER — Emergency Department (HOSPITAL_COMMUNITY): Payer: Commercial Managed Care - HMO

## 2022-07-10 ENCOUNTER — Ambulatory Visit: Payer: Self-pay

## 2022-07-10 ENCOUNTER — Emergency Department (HOSPITAL_COMMUNITY)
Admission: EM | Admit: 2022-07-10 | Discharge: 2022-07-11 | Disposition: A | Discharge status: Home / Self Care | Payer: Commercial Managed Care - HMO | Attending: Emergency Medicine | Admitting: Emergency Medicine

## 2022-07-10 ENCOUNTER — Encounter (HOSPITAL_COMMUNITY): Payer: Self-pay

## 2022-07-10 ENCOUNTER — Other Ambulatory Visit: Payer: Self-pay

## 2022-07-10 DIAGNOSIS — Z79899 Other long term (current) drug therapy: Secondary | ICD-10-CM | POA: Diagnosis not present

## 2022-07-10 DIAGNOSIS — K2900 Acute gastritis without bleeding: Secondary | ICD-10-CM

## 2022-07-10 DIAGNOSIS — R1013 Epigastric pain: Secondary | ICD-10-CM | POA: Diagnosis present

## 2022-07-10 DIAGNOSIS — I10 Essential (primary) hypertension: Secondary | ICD-10-CM | POA: Diagnosis not present

## 2022-07-10 LAB — CBC WITH DIFFERENTIAL/PLATELET
Abs Immature Granulocytes: 0.03 10*3/uL (ref 0.00–0.07)
Basophils Absolute: 0.1 10*3/uL (ref 0.0–0.1)
Basophils Relative: 1 %
Eosinophils Absolute: 0.1 10*3/uL (ref 0.0–0.5)
Eosinophils Relative: 2 %
HCT: 40.2 % (ref 36.0–46.0)
Hemoglobin: 13.2 g/dL (ref 12.0–15.0)
Immature Granulocytes: 0 %
Lymphocytes Relative: 36 %
Lymphs Abs: 3.2 10*3/uL (ref 0.7–4.0)
MCH: 27.3 pg (ref 26.0–34.0)
MCHC: 32.8 g/dL (ref 30.0–36.0)
MCV: 83.2 fL (ref 80.0–100.0)
Monocytes Absolute: 0.7 10*3/uL (ref 0.1–1.0)
Monocytes Relative: 8 %
Neutro Abs: 4.6 10*3/uL (ref 1.7–7.7)
Neutrophils Relative %: 53 %
Platelets: 279 10*3/uL (ref 150–400)
RBC: 4.83 MIL/uL (ref 3.87–5.11)
RDW: 13.7 % (ref 11.5–15.5)
WBC: 8.7 10*3/uL (ref 4.0–10.5)
nRBC: 0 % (ref 0.0–0.2)

## 2022-07-10 LAB — URINALYSIS, ROUTINE W REFLEX MICROSCOPIC
Bilirubin Urine: NEGATIVE
Glucose, UA: NEGATIVE mg/dL
Ketones, ur: NEGATIVE mg/dL
Leukocytes,Ua: NEGATIVE
Nitrite: NEGATIVE
Protein, ur: NEGATIVE mg/dL
Specific Gravity, Urine: 1.025 (ref 1.005–1.030)
pH: 6 (ref 5.0–8.0)

## 2022-07-10 LAB — COMPREHENSIVE METABOLIC PANEL
ALT: 21 U/L (ref 0–44)
AST: 21 U/L (ref 15–41)
Albumin: 3.7 g/dL (ref 3.5–5.0)
Alkaline Phosphatase: 50 U/L (ref 38–126)
Anion gap: 9 (ref 5–15)
BUN: 13 mg/dL (ref 6–20)
CO2: 22 mmol/L (ref 22–32)
Calcium: 8.7 mg/dL — ABNORMAL LOW (ref 8.9–10.3)
Chloride: 104 mmol/L (ref 98–111)
Creatinine, Ser: 0.95 mg/dL (ref 0.44–1.00)
GFR, Estimated: >60 mL/min (ref >60)
Glucose, Bld: 84 mg/dL (ref 70–99)
Potassium: 3.7 mmol/L (ref 3.5–5.1)
Sodium: 135 mmol/L (ref 135–145)
Total Bilirubin: 1.1 mg/dL (ref 0.3–1.2)
Total Protein: 6.5 g/dL (ref 6.5–8.1)

## 2022-07-10 LAB — I-STAT BETA HCG BLOOD, ED (MC, WL, AP ONLY): I-stat hCG, quantitative: <5 m[IU]/mL (ref <5)

## 2022-07-10 LAB — URINALYSIS, MICROSCOPIC (REFLEX)

## 2022-07-10 LAB — LIPASE, BLOOD: Lipase: 24 U/L (ref 11–51)

## 2022-07-10 NOTE — ED Triage Notes (Signed)
Pt arrived POV from home c/o RUQ and RLQ abdominal pain that has been going on x2 months. Pt also endorses nausea, headache and a dry mouth.

## 2022-07-10 NOTE — Telephone Encounter (Signed)
Provider made aware.

## 2022-07-10 NOTE — ED Provider Triage Note (Signed)
Emergency Medicine Provider Triage Evaluation Note  Anne Reese , a 46 y.o. female  was evaluated in triage.  Pt complains of abdominal pain. + lightheaded and dizzy. + vomiting x 6 days. Runny stools. + headache. Increased etoh intake due to parental loss. + epigastric and suprapubic pain  Review of Systems  Positive: Abd pain Negative: fever  Physical Exam  BP (!) 141/85 (BP Location: Right Arm)   Pulse 78   Temp 98.4 F (36.9 C) (Oral)   Resp 18   Ht '5\' 3"'$  (1.6 m)   Wt 91.6 kg   LMP 05/19/2013 Comment: Negative HCG in ED today  SpO2 98%   BMI 35.78 kg/m  Gen:   Awake, no distress   Resp:  Normal effort  MSK:   Moves extremities without difficulty  Other:    Medical Decision Making  Medically screening exam initiated at 2:13 PM.  Appropriate orders placed.  MARCO ADELSON was informed that the remainder of the evaluation will be completed by another provider, this initial triage assessment does not replace that evaluation, and the importance of remaining in the ED until their evaluation is complete.     Margarita Mail, PA-C 07/10/22 1418

## 2022-07-10 NOTE — Telephone Encounter (Signed)
Message from [place] sent at 07/10/2022 10:19 AM EST  Summary: vomiting, weakness, losing weight   Patient called in states has been vomiting, weakness, losing weight, feels like has to go to the bathroom but doesn't.         Chief Complaint: vomiting (severe) Symptoms: lightheadedness, headache, abdominal pain and pain under left breast, dry mouth, black spot with vision Frequency: 5 months Pertinent Negatives: Patient denies fever, vertigo, vomiting blood or coffee grounds Disposition: '[x]'$ ED /'[]'$ Urgent Care (no appt availability in office) / '[]'$ Appointment(In office/virtual)/ '[]'$  [facility]/ '[]'$ Home Care/ '[]'$ Refused Recommended Disposition /'[]'$ [place] [facility]/ '[]'$  Follow-up with PCP Additional Notes: will route msg to PCE  Reason for Disposition  [1] SEVERE vomiting (e.g., 6 or more times/day) AND [2] present > 8 hours (Exception: Patient sounds well, is drinking liquids, does not sound dehydrated, and vomiting has lasted less than 24 hours.)  Answer Assessment - Initial Assessment Questions 1. VOMITING SEVERITY: "How many times have you vomited in the past 24 hours?"     - MILD:  1 - 2 times/day    - MODERATE: 3 - 5 times/day, decreased oral intake without significant weight loss or symptoms of dehydration    - SEVERE: 6 or more times/day, vomits everything or nearly everything, with significant weight loss, symptoms of dehydration      moderate 2. ONSET: "When did the vomiting begin?"      5 months 3. FLUIDS: "What fluids or food have you vomited up today?" "Have you been able to keep any fluids down?"     Chicken and rice from yesterday 4. ABDOMEN PAIN: "Are your having any abdomen pain?" If Yes : "How bad is it and what does it feel like?" (e.g., crampy, dull, intermittent, constant)      Top of stomach  under breast and right side- aches- 9/10- constant 5. DIARRHEA: "Is there any diarrhea?" If Yes, ask: "How many times today?"      no 6. CONTACTS: "Is  there anyone else in the family with the same symptoms?"      Son was sick 2 months ago 7. CAUSE: "What do you think is causing your vomiting?"     unsure 8. HYDRATION STATUS: "Any signs of dehydration?" (e.g., dry mouth [not only dry lips], too weak to stand) "When did you last urinate?"     Dry mouth and weakness, lost weight 100 lbs since last November  9. OTHER SYMPTOMS: "Do you have any other symptoms?" (e.g., fever, headache, vertigo, vomiting blood or coffee grounds, recent head injury)     Black spots in vision, lightheadedness (comes and goes with standing and walking) 10. PREGNANCY: "Is there any chance you are pregnant?" "When was your last menstrual period?"       N/a  Protocols used: Vomiting-A-AH

## 2022-07-11 ENCOUNTER — Telehealth: Payer: Self-pay

## 2022-07-11 MED ORDER — DICYCLOMINE HCL 10 MG/5ML PO SOLN
10.0000 mg | Freq: Once | ORAL | Status: DC
  Filled 2022-07-11: qty 5

## 2022-07-11 MED ORDER — SUCRALFATE 1 GM/10ML PO SUSP: 1.0000 g | Freq: Three times a day (TID) | ORAL | 0 refills | Status: AC | PRN

## 2022-07-11 MED ORDER — PANTOPRAZOLE SODIUM 40 MG PO TBEC
40.0000 mg | DELAYED_RELEASE_TABLET | Freq: Once | ORAL | Status: AC
  Administered 2022-07-11: 40 mg via ORAL
  Filled 2022-07-11: qty 1

## 2022-07-11 MED ORDER — OMEPRAZOLE 40 MG PO CPDR: 40.0000 mg | DELAYED_RELEASE_CAPSULE | Freq: Every day | ORAL | 0 refills | Status: AC

## 2022-07-11 MED ORDER — ALUM & MAG HYDROXIDE-SIMETH 200-200-20 MG/5ML PO SUSP
30.0000 mL | Freq: Once | ORAL | Status: AC
  Administered 2022-07-11: 30 mL via ORAL
  Filled 2022-07-11: qty 30

## 2022-07-11 NOTE — Telephone Encounter (Signed)
Patient was scheduled to see Colleen on 12/27 at 9:00

## 2022-07-11 NOTE — Telephone Encounter (Signed)
Maddie, can you schedule patient for a new pt appt with any provider (RE: gastritis). Thank you

## 2022-07-11 NOTE — Telephone Encounter (Signed)
Armbruster, Carlota Raspberry, MD  Yevette Edwards, RN [employer] can you forward to the schedulers for routine office visit consultation, from the ED, can be seen by anyone. Thanks

## 2022-07-11 NOTE — ED Notes (Signed)
Patient advised triage RN that she is leaving .

## 2022-07-11 NOTE — Discharge Instructions (Signed)
You were seen in the ER today for your abdominal pain.  Your laboratory studies and CT scan were very reassuring.  Suspect you are having gastritis, which is inflammation of the lining of your stomach.  You been prescribed a medication to take for this.  1 is to take daily to treat the gastritis and the other is to take as needed for symptom management.  Please follow-up with your PCP and the gastroenterologist listed below and return to the ER with any new severe symptoms.

## 2022-07-11 NOTE — ED Provider Notes (Signed)
[facility] EMERGENCY DEPARTMENT Provider Note   CSN: [id number] Arrival date & time: 07/10/22  1233     History  Chief Complaint  Patient presents with   Abdominal Pain    Anne Reese is a 46 y.o. female who presents with concern for epigastric and right upper abdominal pain ongoing over the last 2 months. Hx of gastritis, also significant increased stress. Her mother was recently diagnosed with stage 4 hepatic cancer with swift progression and death last month. Patient states that her pain had her concerned given her mother's recent surprising condition.  Describes epigastric pain as gnawing, empty sensation that radiates to the back.  Increased alcohol use recently secondary to stress.  Nausea, no vomiting. NO diarrhea. No medication for gastritis currently. No weight changes.   I personally read the patient medical records.  History of TAH/BSO, hyperlipidemia, diverticulitis, depression, hypertension, anxiety.  No anticoagulation.  HPI     Home Medications Prior to Admission medications   Medication Sig Start Date End Date Taking? Authorizing Provider  omeprazole (PRILOSEC) 40 MG capsule Take 1 capsule (40 mg total) by mouth daily. 07/11/22 08/10/22 Yes Maverick Dieudonne R, PA-C  sucralfate (CARAFATE) 1 GM/10ML suspension Take 10 mLs (1 g total) by mouth 3 (three) times daily as needed. 07/11/22  Yes Graciela Plato R, PA-C  amLODipine (NORVASC) 10 MG tablet Take 1 tablet (10 mg total) by mouth daily. 06/08/22 09/06/22  Camillia Herter, NP  atorvastatin (LIPITOR) 40 MG tablet Take 1 tablet (40 mg total) by mouth daily. 06/08/22 09/06/22  Camillia Herter, NP  escitalopram (LEXAPRO) 20 MG tablet Take 1 tablet (20 mg total) by mouth daily. 06/08/22 09/06/22  Camillia Herter, NP  levocetirizine (XYZAL) 5 MG tablet Take 1 tablet (5 mg total) by mouth at bedtime. 11/23/20   Camillia Herter, NP  meloxicam (MOBIC) 15 MG tablet Take 1 tablet (15 mg total) by mouth  daily. 06/08/22   Camillia Herter, NP  SUMAtriptan (IMITREX) 25 MG tablet Take 1 tablet (25 mg total) by mouth at the start of the headache. May repeat in 2 hours x 1 if headache persists. Max of 2 tabs/24 hours 04/08/21   Camillia Herter, NP  topiramate (TOPAMAX) 25 MG tablet Take 1 tablet (25 mg total) by mouth at bedtime. 04/08/21 06/07/21  Camillia Herter, NP  Vitamin D, Ergocalciferol, (DRISDOL) 1.25 MG (50000 UNIT) CAPS capsule TAKE ONE CAPSULE BY MOUTH EVERY 7 DAYS 02/03/22   Camillia Herter, NP      Allergies    Patient has no known allergies.    Review of Systems   Review of Systems  Constitutional:  Positive for appetite change. Negative for activity change, chills, diaphoresis and fever.  HENT: Negative.    Respiratory: Negative.    Cardiovascular: Negative.   Gastrointestinal:  Positive for abdominal distention and nausea. Negative for vomiting.  Genitourinary: Negative.   Musculoskeletal: Negative.   Skin: Negative.   Neurological: Negative.     Physical Exam Updated Vital Signs BP (!) 143/95 (BP Location: Left Arm)   Pulse 92   Temp 98.2 F (36.8 C) (Oral)   Resp 18   Ht '5\' 3"'$  (1.6 m)   Wt 91.6 kg   LMP 05/19/2013 Comment: Negative HCG in ED today  SpO2 95%   BMI 35.78 kg/m  Physical Exam Vitals and nursing note reviewed.  Constitutional:      Appearance: She is obese. She is not ill-appearing or toxic-appearing.  HENT:     Head: Normocephalic and atraumatic.     Mouth/Throat:     Mouth: Mucous membranes are moist.     Pharynx: No oropharyngeal exudate or posterior oropharyngeal erythema.  Eyes:     General:        Right eye: No discharge.        Left eye: No discharge.     Extraocular Movements: Extraocular movements intact.     Conjunctiva/sclera: Conjunctivae normal.     Pupils: Pupils are equal, round, and reactive to light.  Cardiovascular:     Rate and Rhythm: Normal rate and regular rhythm.     Pulses: Normal pulses.     Heart sounds: Normal  heart sounds. No murmur heard. Pulmonary:     Effort: Pulmonary effort is normal. No respiratory distress.     Breath sounds: Normal breath sounds. No wheezing or rales.  Abdominal:     General: Bowel sounds are normal. There is no distension.     Palpations: Abdomen is soft. There is no hepatomegaly or pulsatile mass.     Tenderness: There is abdominal tenderness in the right upper quadrant and epigastric area.  Musculoskeletal:        General: No deformity.     Cervical back: Neck supple.  Skin:    General: Skin is warm and dry.     Capillary Refill: Capillary refill takes less than 2 seconds.  Neurological:     General: No focal deficit present.     Mental Status: She is alert and oriented to person, place, and time. Mental status is at baseline.  Psychiatric:        Mood and Affect: Mood normal.     ED Results / Procedures / Treatments   Labs (all labs ordered are listed, but only abnormal results are displayed) Labs Reviewed  COMPREHENSIVE METABOLIC PANEL - Abnormal; Notable for the following components:      Result Value   Calcium 8.7 (*)    All other components within normal limits  URINALYSIS, ROUTINE W REFLEX MICROSCOPIC - Abnormal; Notable for the following components:   Hgb urine dipstick TRACE (*)    All other components within normal limits  URINALYSIS, MICROSCOPIC (REFLEX) - Abnormal; Notable for the following components:   Bacteria, UA FEW (*)    All other components within normal limits  CBC WITH DIFFERENTIAL/PLATELET  LIPASE, BLOOD  URINALYSIS, ROUTINE W REFLEX MICROSCOPIC  I-STAT BETA HCG BLOOD, ED ([facility], [facility], [facility] ONLY)    EKG None  Radiology CT ABDOMEN PELVIS WO CONTRAST  Result Date: 07/10/2022 CLINICAL DATA:  Cholelithiasis EXAM: CT ABDOMEN AND PELVIS WITHOUT CONTRAST TECHNIQUE: Multidetector CT imaging of the abdomen and pelvis was performed following the standard protocol without IV contrast. RADIATION DOSE REDUCTION: This exam was performed  according to the departmental dose-optimization program which includes automated exposure control, adjustment of the mA and/or kV according to patient size and/or use of iterative reconstruction technique. COMPARISON:  CT CAP 01/18/2020, same-day right upper quadrant ultrasound FINDINGS: Lower chest: 10 mm solid pulmonary nodule in the lingula (series 3, image 7), is unchanged compared to 2021. Hepatobiliary: Liver has a normal contour. There is no evidence of perihepatic fluid. Lack of IV contrast limits the ability to assess for focal liver lesions. There is no evidence of intrahepatic biliary ductal dilatation. Gallbladder is normal in appearance without evidence of cholecystitis. No evidence of cholelithiasis. Pancreas: No evidence peripancreatic fat stranding to suggest pancreatitis. No evidence of pancreatic ductal dilatation. Spleen:  Spleen is normal in appearance. Adrenals/Urinary Tract: Bilateral adrenal glands are without focal lesions. Bilateral kidneys are without evidence of hydronephrosis or nephrolithiasis. The urinary bladder is decompressed and incompletely assessed. Stomach/Bowel: Stomach, small bowel, large bowel are normal in caliber without evidence of bowel obstruction. There is mild diverticulosis without evidence of diverticulitis. The appendix is normal in appearance. Vascular/Lymphatic: Mild atherosclerotic calcification of the infrarenal abdominal aorta and pelvic vasculature. No enlarged abdominal or pelvic lymph nodes. Reproductive: Status post hysterectomy. No adnexal masses. Other: No abdominal wall hernia or abnormality. No abdominopelvic ascites. Musculoskeletal: No acute or significant osseous findings. IMPRESSION: No evidence of cholelithiasis or cholecystitis. No CT evidence of acute abdominopelvic process. Aortic Atherosclerosis (ICD10-I70.0). Electronically Signed   By: Marin Roberts M.D.   On: 07/10/2022 16:24   US Abdomen Limited RUQ (LIVER/GB)  Result Date:  07/10/2022 CLINICAL DATA:  Vomiting and right upper quadrant pain. EXAM: ULTRASOUND ABDOMEN LIMITED RIGHT UPPER QUADRANT COMPARISON:  CT abdomen/pelvis 01/18/2019 FINDINGS: Gallbladder: No gallstones or wall thickening visualized. No sonographic Murphy sign noted by sonographer. Common bile duct: Diameter: 4 mm Liver: No focal lesion identified. Within normal limits in parenchymal echogenicity. Portal vein is patent on color Doppler imaging with normal direction of blood flow towards the liver. Other: None. IMPRESSION: Normal right upper quadrant ultrasound. Electronically Signed   By: Valetta Mole M.D.   On: 07/10/2022 15:26    Procedures Procedures    Medications Ordered in ED Medications  dicyclomine (BENTYL) 10 MG/5ML solution 10 mg (has no administration in time range)  alum & mag hydroxide-simeth (MAALOX/MYLANTA) 200-200-20 MG/5ML suspension 30 mL (30 mLs Oral Given 07/11/22 0210)  pantoprazole (PROTONIX) EC tablet 40 mg (40 mg Oral Given 07/11/22 0210)    ED Course/ Medical Decision Making/ A&P                           Medical Decision Making 46 year old female presents with concern for epigastric and right upper quadrant pain.  Hypertensive on exam and vital signs are normal.  Cardiopulmonary exam is normal, abdominal exam with epigastric and mild right upper quadrant tenderness palpation without rebound or guarding.  No CVAT.  The differential diagnosis for RUQ includes but is not limited to:  Cholelithiasis / choledocholithiasis / cholecystitis / cholangitis, hepatitis (eg. viral, alcoholic, toxic),liver abscess, pancreatitis, liver / pancreatic / biliary tract cancer, ischemic hepatopathy (shock liver), hepatic vein obstruction (Budd-Chiari syndrome), liver cell adenoma, peptic ulcer disease (duodenal), functional or nonulcer dyspepsia, right lower lobe pneumonia, pyelonephritis, urinary calculi,  Fitz-Hugh-Curtis syndrome (with pelvic inflammatory disease), herpes zoster, trauma or  musculoskeletal pain, herniated disk, abdominal abscess, intestinal ischemia, physical or sexual abuse, ectopic pregnancy, IUP, Mittelschmerz, ovarian cyst/torsion, threatened/ievitable abortion, PID, endometriosis, molar pregnancy, heterotopic pregnancy, corpus luteum cyst, appendicitis, UTI/renal colic, IBD.    Amount and/or Complexity of Data Reviewed Labs:     Details: CBC without leukocytosis or anemia, CMP unremarkable.  Lipase is normal.  UA without evidence of infection, trace hemoglobin in the urine.  Right upper quadrant ultrasound unremarkable.    Radiology:     Details: CT of the abdomen pelvis without contrast was negative for acute abdominal pelvic process.  Images visualized by this provider.    Risk OTC drugs. Prescription drug management.   Clinical picture most consistent with acute gastritis likely secondary to patient's increased rest recently.  Will administer GI cocktail and first dose of PPI in the ED.  Will discharge on PPI with recommendations  for PCP and gastroenterology follow-up.  No further workup warranted in the ED at this time.  Clinical concern for emergent underlying etiology that warrant further ED workup or inpatient management is exceedingly low.  Crystallee voiced understanding of her medical evaluation and treatment plan. Each of their questions answered to their expressed satisfaction.  Return precautions were given.  Patient is well-appearing, stable, and was discharged in good condition.  This chart was dictated using voice recognition software, Dragon. Despite the best efforts of this provider to proofread and correct errors, errors may still occur which can change documentation meaning.   Final Clinical Impression(s) / ED Diagnoses Final diagnoses:  Acute gastritis without hemorrhage, unspecified gastritis type    Rx / DC Orders ED Discharge Orders          Ordered    omeprazole (PRILOSEC) 40 MG capsule  Daily        07/11/22 0207     sucralfate (CARAFATE) 1 GM/10ML suspension  3 times daily PRN        07/11/22 0207              Karynn Deblasi, Gypsy Balsam, PA-C 18/29/93 [id number]    Delora Fuel, MD 67/89/38 [phone]

## 2022-07-13 ENCOUNTER — Telehealth: Payer: Commercial Managed Care - HMO

## 2022-07-13 ENCOUNTER — Telehealth: Payer: Commercial Managed Care - HMO | Admitting: Emergency Medicine

## 2022-07-13 ENCOUNTER — Telehealth: Payer: Commercial Managed Care - HMO | Admitting: Nurse Practitioner

## 2022-07-13 ENCOUNTER — Ambulatory Visit: Payer: Self-pay

## 2022-07-13 DIAGNOSIS — R6889 Other general symptoms and signs: Secondary | ICD-10-CM

## 2022-07-13 DIAGNOSIS — J40 Bronchitis, not specified as acute or chronic: Secondary | ICD-10-CM

## 2022-07-13 MED ORDER — IBUPROFEN 600 MG PO TABS: 600.0000 mg | ORAL_TABLET | Freq: Three times a day (TID) | ORAL | 0 refills | Status: AC | PRN

## 2022-07-13 MED ORDER — OSELTAMIVIR PHOSPHATE 75 MG PO CAPS: 75.0000 mg | ORAL_CAPSULE | Freq: Two times a day (BID) | ORAL | 0 refills | Status: AC

## 2022-07-13 MED ORDER — SPACER/AERO-HOLDING CHAMBERS DEVI: 1.0000 | 0 refills | Status: AC | PRN

## 2022-07-13 MED ORDER — ALBUTEROL SULFATE HFA 108 (90 BASE) MCG/ACT IN AERS: 2.0000 | INHALATION_SPRAY | Freq: Four times a day (QID) | RESPIRATORY_TRACT | 0 refills | Status: AC | PRN

## 2022-07-13 NOTE — Telephone Encounter (Signed)
    Chief Complaint: Body aches, fever Symptoms: Above Frequency: Yesterday Pertinent Negatives: Patient denies  Disposition: '[]'$ ED /'[]'$ Urgent Care (no appt availability in office) / '[]'$ Appointment(In office/virtual)/ '[x]'$  [facility]/ '[]'$ Home Care/ '[]'$ Refused Recommended Disposition /'[]'$ [facility]/ '[]'$  Follow-up with PCP Additional Notes: Pt. Will schedule virtual visit through [facility]. Nurse attempted to schedule. Site currently down  Reason for Disposition  Fever present > 3 days (72 hours)  Answer Assessment - Initial Assessment Questions 1. ONSET: "When did the muscle aches or body pains start?"      2 days ago 2. LOCATION: "What part of your body is hurting?" (e.g., entire body, arms, legs)      All over and headache 3. SEVERITY: "How bad is the pain?" (Scale 1-10; or mild, moderate, severe)   - MILD (1-3): doesn't interfere with normal activities    - MODERATE (4-7): interferes with normal activities or awakens from sleep    - SEVERE (8-10):  excruciating pain, unable to do any normal activities      Severe 4. CAUSE: "What do you think is causing the pains?"     Unsure 5. FEVER: "Have you been having fever?"     Feels like she has fever 6. OTHER SYMPTOMS: "Do you have any other symptoms?" (e.g., chest pain, weakness, rash, cold or flu symptoms, weight loss)     Weakness 7. PREGNANCY: "Is there any chance you are pregnant?" "When was your last menstrual period?"     No 8. TRAVEL: "Have you traveled out of the country in the last month?" (e.g., travel history, exposures)     No  Protocols used: Muscle Aches and Body Pain-A-AH

## 2022-07-13 NOTE — Progress Notes (Signed)
Virtual Visit Consent   Anne Reese, you are scheduled for a virtual visit with a [facility] provider today. Just as with appointments in the office, your consent must be obtained to participate. Your consent will be active for this visit and any virtual visit you may have with one of our providers in the next 365 days. If you have a MyChart account, a copy of this consent can be sent to you electronically.  As this is a virtual visit, video technology does not allow for your provider to perform a traditional examination. This may limit your provider's ability to fully assess your condition. If your provider identifies any concerns that need to be evaluated in person or the need to arrange testing (such as labs, EKG, etc.), we will make arrangements to do so. Although advances in technology are sophisticated, we cannot ensure that it will always work on either your end or our end. If the connection with a video visit is poor, the visit may have to be switched to a telephone visit. With either a video or telephone visit, we are not always able to ensure that we have a secure connection.  By engaging in this virtual visit, you consent to the provision of healthcare and authorize for your insurance to be billed (if applicable) for the services provided during this visit. Depending on your insurance coverage, you may receive a charge related to this service.  I need to obtain your verbal consent now. Are you willing to proceed with your visit today? Anne Reese has provided verbal consent on 07/13/2022 for a virtual visit (video or telephone). Gildardo Pounds, NP  Date: 07/13/2022 6:29 PM  Virtual Visit via Video Note   I, Gildardo Pounds, connected with  Anne Reese  ([id number], 14-Jul-1976) on 07/13/22 at  6:30 PM EST by a video-enabled telemedicine application and verified that I am speaking with the correct person using two identifiers.  Location: Patient: Virtual Visit Location Patient:  Home Provider: Virtual Visit Location Provider: Home Office   I discussed the limitations of evaluation and management by telemedicine and the availability of in person appointments. The patient expressed understanding and agreed to proceed.    History of Present Illness: Anne Reese is a 46 y.o. who identifies as a female who was assigned female at birth, and is being seen today for Flu like symptoms.  Patient symptoms include  fever (has not checked temp but feels she has one), chills, body aches, headache, diarrhea, bilateral ear pressure  . Onset of symptoms was 2 days ago, gradually worsening since that time.    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 DO NOT TAKE WHILE TAKING MELOXICAM, Disp: 30 tablet, Rfl: 0   oseltamivir (TAMIFLU) 75 MG capsule, Take 1 capsule (75 mg total) by mouth 2 (two) times daily for 5 days., Disp: 10 capsule, Rfl: 0   albuterol (VENTOLIN HFA) 108 (90 Base)  MCG/ACT inhaler, Inhale 2 puffs into the lungs every 6 (six) hours as needed for wheezing or shortness of breath., Disp: 8 g, Rfl: 0   amLODipine (NORVASC) 10 MG tablet, Take 1 tablet (10 mg total) by mouth daily., Disp: 30 tablet, Rfl: 2   atorvastatin (LIPITOR) 40 MG tablet, Take 1 tablet (40 mg total) by mouth daily., Disp: 30 tablet, Rfl: 2   escitalopram (LEXAPRO) 20 MG tablet, Take 1 tablet (20 mg total) by mouth daily., Disp: 30 tablet, Rfl: 2    levocetirizine (XYZAL) 5 MG tablet, Take 1 tablet (5 mg total) by mouth at bedtime., Disp: 30 tablet, Rfl: 0   meloxicam (MOBIC) 15 MG tablet, Take 1 tablet (15 mg total) by mouth daily., Disp: 30 tablet, Rfl: 2   omeprazole (PRILOSEC) 40 MG capsule, Take 1 capsule (40 mg total) by mouth daily., Disp: 30 capsule, Rfl: 0   Spacer/Aero-Holding Chambers DEVI, 1 each by Does not apply route as needed., Disp: 1 each, Rfl: 0   sucralfate (CARAFATE) 1 GM/10ML suspension, Take 10 mLs (1 g total) by mouth 3 (three) times daily as needed., Disp: 420 mL, Rfl: 0   SUMAtriptan (IMITREX) 25 MG tablet, Take 1 tablet (25 mg total) by mouth at the start of the headache. May repeat in 2 hours x 1 if headache persists. Max of 2 tabs/24 hours, Disp: 30 tablet, Rfl: 0   topiramate (TOPAMAX) 25 MG tablet, Take 1 tablet (25 mg total) by mouth at bedtime., Disp: 30 tablet, Rfl: 0   Vitamin D, Ergocalciferol, (DRISDOL) 1.25 MG (50000 UNIT) CAPS capsule, TAKE ONE CAPSULE BY MOUTH EVERY 7 DAYS, Disp: 12 capsule, Rfl: 0  Observations/Objective: Patient is well-developed, well-nourished in no acute distress.  Resting comfortably at home.  Head is normocephalic, atraumatic.  No labored breathing.  Speech is clear and coherent with logical content.  Patient is alert and oriented at baseline.    Assessment and Plan: 1. Flu-like symptoms - oseltamivir (TAMIFLU) 75 MG capsule; Take 1 capsule (75 mg total) by mouth 2 (two) times daily for 5 days.  Dispense: 10 capsule; Refill: 0 Continue OTC medications as prescribed. Alternate with motrin and tylenol for relief of fever and pain.   Follow Up Instructions: I discussed the assessment and treatment plan with the patient. The patient was provided an opportunity to ask questions and all were answered. The patient agreed with the plan and demonstrated an understanding of the instructions.  A copy of instructions were sent to the patient via MyChart unless otherwise noted below.     The patient was advised to call back or seek an in-person evaluation if the symptoms worsen or if the condition fails to improve as anticipated.  Time:  I spent 11 minutes with the patient via telehealth technology discussing the above problems/concerns.    Gildardo Pounds, NP

## 2022-07-13 NOTE — Progress Notes (Signed)
We are sorry that you are not feeling well.  Here is how we plan to help!  Based on your presentation I believe you most likely have A cough due to a virus.  This is called viral bronchitis and is best treated by rest, plenty of fluids and control of the cough.  You may use Ibuprofen or Tylenol as directed to help your symptoms.     In addition you may use A non-prescription cough medication called Mucinex DM: take 2 tablets every 12 hours.  I have also prescribed an inhaler and a spacer for you to use.   If you are having trouble with acid reflux also, try using Pepcid (an over the counter medicine) according to package directions. If you frequently have trouble with acid reflux, you should talk with your primary care provider.   From your responses in the eVisit questionnaire you describe inflammation in the upper respiratory tract which is causing a significant cough.  This is commonly called Bronchitis and has four common causes:   Allergies Viral Infections Acid Reflux Bacterial Infection Allergies, viruses and acid reflux are treated by controlling symptoms or eliminating the cause. An example might be a cough caused by taking certain blood pressure medications. You stop the cough by changing the medication. Another example might be a cough caused by acid reflux. Controlling the reflux helps control the cough.  USE OF BRONCHODILATOR ("RESCUE") INHALERS: There is a risk from using your bronchodilator too frequently.  The risk is that over-reliance on a medication which only relaxes the muscles surrounding the breathing tubes can reduce the effectiveness of medications prescribed to reduce swelling and congestion of the tubes themselves.  Although you feel brief relief from the bronchodilator inhaler, your asthma may actually be worsening with the tubes becoming more swollen and filled with mucus.  This can delay other crucial treatments, such as oral steroid medications. If you need to use a  bronchodilator inhaler daily, several times per day, you should discuss this with your provider.  There are probably better treatments that could be used to keep your asthma under control.     HOME CARE Only take medications as instructed by your medical team. Drink plenty of fluids and get plenty of rest. Avoid close contacts especially the very young and the elderly Cover your mouth if you cough or cough into your sleeve. Always remember to wash your hands A steam or ultrasonic humidifier can help congestion.   GET HELP RIGHT AWAY IF: You develop worsening fever. You become short of breath You cough up blood. Your symptoms persist after you have completed your treatment plan MAKE SURE YOU  Understand these instructions. Will watch your condition. Will get help right away if you are not doing well or get worse.    Thank you for choosing an e-visit.  Your e-visit answers were reviewed by a board certified advanced clinical practitioner to complete your personal care plan. Depending upon the condition, your plan could have included both over the counter or prescription medications.  Please review your pharmacy choice. Make sure the pharmacy is open so you can pick up prescription now. If there is a problem, you may contact your provider through [employer] and have the prescription routed to another pharmacy.  Your safety is important to [place]. If you have drug allergies check your prescription carefully.   For the next 24 hours you can use MyChart to ask questions about today's visit, request a non-urgent call back, or ask  for a work or school excuse. You will get an email in the next two days asking about your experience. I hope that your e-visit has been valuable and will speed your recovery.  I have spent 5 minutes in review of e-visit questionnaire, review and updating patient chart, medical decision making and response to patient.   Willeen Cass, PhD, FNP-BC

## 2022-07-13 NOTE — Patient Instructions (Signed)
Anne Reese, thank you for joining Gildardo Pounds, NP for today's virtual visit.  While this provider is not your primary care provider (PCP), if your PCP is located in our provider database this encounter information will be shared with them immediately following your visit.   [facility] account gives you access to today's visit and all your visits, tests, and labs performed at [facility] " click here if you don't have a [facility] account or go to [email or web address]  Consent: (Patient) Anne Reese provided verbal consent for this virtual visit at the beginning of the encounter.  Current Medications:  Current Outpatient Medications:    ibuprofen (ADVIL) 600 MG tablet, Take 1 tablet (600 mg total) by mouth every 8 (eight) hours as needed. DO NOT TAKE WHILE TAKING MELOXICAM, Disp: 30 tablet, Rfl: 0   oseltamivir (TAMIFLU) 75 MG capsule, Take 1 capsule (75 mg total) by mouth 2 (two) times daily for 5 days., Disp: 10 capsule, Rfl: 0   albuterol (VENTOLIN HFA) 108 (90 Base) MCG/ACT inhaler, Inhale 2 puffs into the lungs every 6 (six) hours as needed for wheezing or shortness of breath., Disp: 8 g, Rfl: 0   amLODipine (NORVASC) 10 MG tablet, Take 1 tablet (10 mg total) by mouth daily., Disp: 30 tablet, Rfl: 2   atorvastatin (LIPITOR) 40 MG tablet, Take 1 tablet (40 mg total) by mouth daily., Disp: 30 tablet, Rfl: 2   escitalopram (LEXAPRO) 20 MG tablet, Take 1 tablet (20 mg total) by mouth daily., Disp: 30 tablet, Rfl: 2   levocetirizine (XYZAL) 5 MG tablet, Take 1 tablet (5 mg total) by mouth at bedtime., Disp: 30 tablet, Rfl: 0   meloxicam (MOBIC) 15 MG tablet, Take 1 tablet (15 mg total) by mouth daily., Disp: 30 tablet, Rfl: 2   omeprazole (PRILOSEC) 40 MG capsule, Take 1 capsule (40 mg total) by mouth daily., Disp: 30 capsule, Rfl: 0   Spacer/Aero-Holding Chambers DEVI, 1 each by Does not apply route as needed., Disp: 1 each, Rfl: 0   sucralfate  (CARAFATE) 1 GM/10ML suspension, Take 10 mLs (1 g total) by mouth 3 (three) times daily as needed., Disp: 420 mL, Rfl: 0   SUMAtriptan (IMITREX) 25 MG tablet, Take 1 tablet (25 mg total) by mouth at the start of the headache. May repeat in 2 hours x 1 if headache persists. Max of 2 tabs/24 hours, Disp: 30 tablet, Rfl: 0   topiramate (TOPAMAX) 25 MG tablet, Take 1 tablet (25 mg total) by mouth at bedtime., Disp: 30 tablet, Rfl: 0   Vitamin D, Ergocalciferol, (DRISDOL) 1.25 MG (50000 UNIT) CAPS capsule, TAKE ONE CAPSULE BY MOUTH EVERY 7 DAYS, Disp: 12 capsule, Rfl: 0   Medications ordered in this encounter:  Meds ordered this encounter  Medications   oseltamivir (TAMIFLU) 75 MG capsule    Sig: Take 1 capsule (75 mg total) by mouth 2 (two) times daily for 5 days.    Dispense:  10 capsule    Refill:  0    Order Specific Question:   Supervising Provider    Answer:   Chase Picket [id number]   ibuprofen (ADVIL) 600 MG tablet    Sig: Take 1 tablet (600 mg total) by mouth every 8 (eight) hours as needed. DO NOT TAKE WHILE TAKING MELOXICAM    Dispense:  30 tablet    Refill:  0    Order Specific Question:   Supervising Provider    Answer:  Chase Picket [[id number]]     *If you need refills on other medications prior to your next appointment, please contact your pharmacy*  Follow-Up: Call back or seek an in-person evaluation if the symptoms worsen or if the condition fails to improve as anticipated.  [place] [phone]  Other Instructions Continue OTC medications as prescribed. Alternate with motrin and tylenol for relief of fever and pain.   If you have been instructed to have an in-person evaluation today at a local Urgent Care facility, please use the link below. It will take you to a list of all of our available [facility], including address, phone number and hours of operation. Please do not delay care.  [facility]  If you or a  family member do not have a primary care provider, use the link below to schedule a visit and establish care. When you choose a [facility] primary care physician or advanced practice provider, you gain a long-term partner in health. Find a Primary Care Provider  Learn more about [facility] in-office and virtual care options: [facility]

## 2022-07-13 NOTE — Progress Notes (Signed)
Message sent to patient requesting further input regarding current symptoms. Awaiting patient response.  

## 2022-07-14 ENCOUNTER — Encounter: Payer: Self-pay | Admitting: Nurse Practitioner

## 2022-07-14 ENCOUNTER — Telehealth: Payer: Self-pay | Admitting: Family

## 2022-07-14 NOTE — Telephone Encounter (Signed)
Copied from [place]. Topic: Referral - Question >> Jul 14, 2022 11:30 AM Erskine Squibb wrote: Reason for CRM: The patient states she has a history of cancer in the family and her mother passed recently. She asked her provider back in October to set up a cancer screening especially in the stomach and liver as that is where it is prevalent with her family. Please assist patient further.  Referral placed to Gastroenterology on 06/08/22- referral notes indicate [facility] [address] [place], [place] [address] PH# [phone]  06/20/22-Pt contacted unable to lvm Pt would need to f/u with their office for status of appt

## 2022-07-14 NOTE — Telephone Encounter (Signed)
Copied from [place] [id number]. Topic: Appointment Scheduling - Scheduling Inquiry for Clinic >> Jul 14, 2022 11:18 AM Erskine Squibb wrote: Reason for CRM: The patient states she had a virtual appt yesterday with her provider and was told a doctor note would be provided. She states she never got one and needs one to return to work as soon as possible. Please assist patient further.

## 2022-07-18 NOTE — Telephone Encounter (Signed)
Called patient and she stated that she has found the letter already. No further question or concerns

## 2022-08-03 ENCOUNTER — Telehealth: Payer: Self-pay

## 2022-08-03 NOTE — Telephone Encounter (Signed)
Patient does not meet criteria for lung cancer screening LDCT.  Must be age 46 years minimum.  Referral to [facility] has been closed.

## 2022-08-08 NOTE — Progress Notes (Deleted)
08/08/2022 Anne Reese [id number] Sep 22, 1975   Chief Complaint: Gastritis follow up  History of Present Illness: Anne Reese. Anne Reese is a 46 year old female with a past medical history of asthma, depression, crack cocaine use (abstinent since 2006), hyperlipidemia and diverticulitis.  Past partial hysterectomy. S/P laparoscopy secondary to a ruptured left ovarian hemorrhagic cyst 01/18/2020.     She reported having a colonoscopy completed 01/2019 (when living in [facility]) due to having loose and hard black stools with bright red blood on the toilet tissue for approximately 4 to 5 weeks.  She reported having a few colon polyps removed,  the colonoscopy was otherwise normal. She possibly had an EGD at the same time but she is not sure. No further rectal bleeding since completing her colonoscopy.  Maternal grandfather with history of colon cancer.      Latest Ref Rng & Units 07/10/2022    2:16 PM 04/05/2021    5:34 PM 03/17/2020   10:45 AM  CBC  WBC 4.0 - 10.5 K/uL 8.7  9.7  8.4   Hemoglobin 12.0 - 15.0 g/dL 13.2  12.9  13.2   Hematocrit 36.0 - 46.0 % 40.2  38.7  40.4   Platelets 150 - 400 K/uL 279  291  212.0        Latest Ref Rng & Units 07/10/2022    2:16 PM 06/08/2022   12:00 AM 04/05/2021    5:34 PM  CMP  Glucose 70 - 99 mg/dL 84  80  87   BUN 6 - 20 mg/dL '13  6  14   '$ Creatinine 0.44 - 1.00 mg/dL 0.95  0.76  0.94   Sodium 135 - 145 mmol/L 135  143  140   Potassium 3.5 - 5.1 mmol/L 3.7  4.2  3.4   Chloride 98 - 111 mmol/L 104  105  107   CO2 22 - 32 mmol/L '22  22  25   '$ Calcium 8.9 - 10.3 mg/dL 8.7  9.5  9.2   Total Protein 6.5 - 8.1 g/dL 6.5  6.6  7.4   Total Bilirubin 0.3 - 1.2 mg/dL 1.1  0.7  0.7   Alkaline Phos 38 - 126 U/L 50  60  67   AST 15 - 41 U/L '21  19  21   '$ ALT 0 - 44 U/L '21  17  18     '$ RUQ sonogram 07/10/2022: FINDINGS: Gallbladder: No gallstones or wall thickening visualized. No sonographic Murphy sign noted by sonographer.   Common bile duct: Diameter: 4  mm   Liver: No focal lesion identified. Within normal limits in parenchymal echogenicity. Portal vein is patent on color Doppler imaging with normal direction of blood flow towards the liver.  IMPRESSION: Normal right upper quadrant ultrasound.     CTAP WO contrast 07/10/2022: COMPARISON:  CT CAP 01/18/2020, same-day right upper quadrant ultrasound   FINDINGS: Lower chest: 10 mm solid pulmonary nodule in the lingula (series 3, image 7), is unchanged compared to 2021.   Hepatobiliary: Liver has a normal contour. There is no evidence of perihepatic fluid. Lack of IV contrast limits the ability to assess for focal liver lesions. There is no evidence of intrahepatic biliary ductal dilatation. Gallbladder is normal in appearance without evidence of cholecystitis. No evidence of cholelithiasis.   Pancreas: No evidence peripancreatic fat stranding to suggest pancreatitis. No evidence of pancreatic ductal dilatation.   Spleen: Spleen is normal in appearance.   Adrenals/Urinary Tract: Bilateral adrenal glands  are without focal lesions. Bilateral kidneys are without evidence of hydronephrosis or nephrolithiasis. The urinary bladder is decompressed and incompletely assessed.   Stomach/Bowel: Stomach, small bowel, large bowel are normal in caliber without evidence of bowel obstruction. There is mild diverticulosis without evidence of diverticulitis. The appendix is normal in appearance.   Vascular/Lymphatic: Mild atherosclerotic calcification of the infrarenal abdominal aorta and pelvic vasculature. No enlarged abdominal or pelvic lymph nodes.   Reproductive: Status post hysterectomy. No adnexal masses.   Other: No abdominal wall hernia or abnormality. No abdominopelvic ascites.   Musculoskeletal: No acute or significant osseous findings.   IMPRESSION: No evidence of cholelithiasis or cholecystitis. No CT evidence of acute abdominopelvic process.  Aortic Atherosclerosis     Abdominal/pelvic CT scan [facility] 05/02/2019 [facility]: 1. Acute diverticulitis of the proximal sigmoid colon with extensive  surrounding inflammation and small amount of likely reactive edema/free  fluid tracking along the left pericolic gutter with trace free fluid in the  pelvis. No abscess or free air.    Abdominal/pelvic CT with contrast 01/18/2020: 1. Noninflamed sigmoid diverticulosis. 2. 2.7 cm left adnexal cyst, likely ovarian in origin.   EGD and colonoscopy were done by  Dr. Madelyn Brunner 01/23/2019:  EGD showed mild antral gastropathy s/p biopsies for H. Pylori. Otherwise normal EGD.   Colonoscopy: Fair prep. Diminutive sessile polp s/p cold forcep biopsy.  Small internal hemorrhoids.  Repeat colonoscopy in 3 years   Past Medical History:  Diagnosis Date   Asthma    no inhaler use x 15 yrs   Complication of anesthesia    asthma attack in PACU   Depression    Diverticulitis    Fibroids 2013   Hyperlipidemia    Hypertension    Past Surgical History:  Procedure Laterality Date   ABDOMINAL HYSTERECTOMY N/A 06/24/2013   Procedure: HYSTERECTOMY ABDOMINAL;  Surgeon: Betsy Coder, MD;  Location: [facility];  Service: Gynecology;  Laterality: N/A;   BILATERAL SALPINGECTOMY Bilateral 06/24/2013   Procedure: BILATERAL SALPINGECTOMY;  Surgeon: Betsy Coder, MD;  Location: [facility];  Service: Gynecology;  Laterality: Bilateral;   CYSTOSCOPY N/A 06/24/2013   Procedure: CYSTOSCOPY;  Surgeon: Betsy Coder, MD;  Location: [facility];  Service: Gynecology;  Laterality: N/A;   DILATION AND CURETTAGE OF UTERUS N/A 06/24/2013   Procedure: DILATATION AND CURETTAGE;  Surgeon: Betsy Coder, MD;  Location: [facility];  Service: Gynecology;  Laterality: N/A;   GANGLION CYST EXCISION Left    LAPAROSCOPY N/A 01/18/2020   Procedure: LAPAROSCOPY DIAGNOSTIC, WITH EVACTION OF HEMOPERITONEUM;  Surgeon: Sloan Leiter, MD;  Location: [facility];  Service: Gynecology;  Laterality: N/A;    TUBAL LIGATION         Current Medications, Allergies, Past Medical History, Past Surgical History, Family History and Social History were reviewed in [employer] record.   Review of Systems:   Constitutional: Negative for fever, sweats, chills or weight loss.  Respiratory: Negative for shortness of breath.   Cardiovascular: Negative for chest pain, palpitations and leg swelling.  Gastrointestinal: See HPI.  Musculoskeletal: Negative for back pain or muscle aches.  Neurological: Negative for dizziness, headaches or paresthesias.    Physical Exam: LMP 05/19/2013 Comment: Negative HCG in ED today General: Well developed, w   ***female in no acute distress. Head: Normocephalic and atraumatic. Eyes: No scleral icterus. Conjunctiva pink . Ears: Normal auditory acuity. Mouth: Dentition intact. No ulcers or lesions.  Lungs: Clear throughout to auscultation. Heart: Regular rate and  rhythm, no murmur. Abdomen: Soft, nontender and nondistended. No masses or hepatomegaly. Normal bowel sounds x 4 quadrants.  Rectal: *** Musculoskeletal: Symmetrical with no gross deformities. Extremities: No edema. Neurological: Alert oriented x 4. No focal deficits.  Psychological: Alert and cooperative. Normal mood and affect  Assessment and Recommendations: ***

## 2022-08-08 NOTE — Telephone Encounter (Signed)
Make patient aware of message from Joella Prince, RN.

## 2022-08-09 ENCOUNTER — Ambulatory Visit: Payer: Commercial Managed Care - HMO | Admitting: Nurse Practitioner

## 2022-08-18 ENCOUNTER — Telehealth: Payer: Self-pay | Admitting: Family

## 2022-08-18 NOTE — Telephone Encounter (Signed)
Medication Refill - Medication: Buspirone 5 mg  Has the patient contacted their pharmacy? No.  (Agent: If no, request that the patient contact the pharmacy for the refill. If patient does not wish to contact the pharmacy document the reason why and proceed with request.) (Agent: If yes, when and what did the pharmacy advise?)  Preferred Pharmacy (with phone number or street name): [facility]  [place]  Agent: Please be advised that RX refills may take up to 3 business days. We ask that you follow-up with your pharmacy.

## 2022-08-21 ENCOUNTER — Other Ambulatory Visit: Payer: Self-pay

## 2022-08-21 DIAGNOSIS — F32A Depression, unspecified: Secondary | ICD-10-CM

## 2022-08-21 MED ORDER — BUSPIRONE HCL 5 MG PO TABS: 5.0000 mg | ORAL_TABLET | Freq: Every day | ORAL | 0 refills | Status: AC

## 2022-08-21 NOTE — Telephone Encounter (Signed)
Buspar has been updated and sent into pharmacy

## 2022-08-25 ENCOUNTER — Other Ambulatory Visit: Payer: Self-pay | Admitting: *Deleted

## 2022-08-25 ENCOUNTER — Ambulatory Visit: Payer: Self-pay | Admitting: *Deleted

## 2022-08-25 ENCOUNTER — Other Ambulatory Visit: Payer: Self-pay | Admitting: Family

## 2022-08-25 DIAGNOSIS — I10 Essential (primary) hypertension: Secondary | ICD-10-CM

## 2022-08-25 DIAGNOSIS — F32A Depression, unspecified: Secondary | ICD-10-CM

## 2022-08-25 NOTE — Telephone Encounter (Signed)
Rx 06/08/22 #30 2RF- too soon Requested Prescriptions  Pending Prescriptions Disp Refills   escitalopram (LEXAPRO) 20 MG tablet 30 tablet 2    Sig: Take 1 tablet (20 mg total) by mouth daily.     Psychiatry:  Antidepressants - SSRI Passed - 08/25/2022  4:13 PM      Passed - Completed PHQ-2 or PHQ-9 in the last 360 days      Passed - Valid encounter within last 6 months    Recent Outpatient Visits           2 months ago Primary hypertension   Primary Care at [facility], Amy J, NP   10 months ago Essential (primary) hypertension   Primary Care at [facility], Amy J, NP   1 year ago Flu-like symptoms   Primary Care at [facility], Kriste Basque, NP   1 year ago Migraine without aura and without status migrainosus, not intractable   Primary Care at [facility], Amy J, NP   1 year ago Essential hypertension   Primary Care at [facility], Flonnie Hailstone, NP       Future Appointments             In 2 weeks Camillia Herter, NP Primary Care at [facility]

## 2022-08-25 NOTE — Telephone Encounter (Signed)
Noted  

## 2022-08-25 NOTE — Telephone Encounter (Signed)
Requested Prescriptions  Pending Prescriptions Disp Refills   amLODipine (NORVASC) 10 MG tablet [Pharmacy Med Name: amLODIPine Besylate 10 MG Oral Tablet] 30 tablet 0    Sig: Take 1 tablet by mouth once daily     Cardiovascular: Calcium Channel Blockers 2 Failed - 08/25/2022  3:18 PM      Failed - Last BP in normal range    BP Readings from Last 1 Encounters:  07/11/22 (!) 143/95         Passed - Last Heart Rate in normal range    Pulse Readings from Last 1 Encounters:  07/11/22 92         Passed - Valid encounter within last 6 months    Recent Outpatient Visits           2 months ago Primary hypertension   Primary Care at [facility], Amy J, NP   10 months ago Essential (primary) hypertension   Primary Care at [facility], Amy J, NP   1 year ago Flu-like symptoms   Primary Care at [facility], Kriste Basque, NP   1 year ago Migraine without aura and without status migrainosus, not intractable   Primary Care at [facility], Amy J, NP   1 year ago Essential hypertension   Primary Care at [facility], Flonnie Hailstone, NP       Future Appointments             In 2 weeks Camillia Herter, NP Primary Care at [facility]

## 2022-08-25 NOTE — Telephone Encounter (Signed)
  Chief Complaint: patient states she has been without BP medication for 2 weeks- symptoms Symptoms: headache, dizziness, blurred vision Frequency: symptoms so bad she could not drive to work- difficult to walk Pertinent Negatives: Patient denies   Disposition: '[x]'$ ED /'[]'$ Urgent Care (no appt availability in office) / '[]'$ Appointment(In office/virtual)/ '[]'$  [facility]/ '[]'$ Home Care/ '[]'$ Refused Recommended Disposition /'[]'$ [facility]/ '[]'$  Follow-up with PCP Additional Notes: Not having BP medication and severe symptoms- advised ED

## 2022-08-25 NOTE — Telephone Encounter (Signed)
Reason for Disposition  Ran out of BP medications  Answer Assessment - Initial Assessment Questions 1. BLOOD PRESSURE: "What is the blood pressure?" "Did you take at least two measurements 5 minutes apart?"     Patient can not check BP-at work 2. ONSET: "When did you take your blood pressure?"     Unsure what BP is at this time- no medication for 2 weeks  4. HISTORY: "Do you have a history of high blood pressure?"     yes 5. MEDICINES: "Are you taking any medicines for blood pressure?" "Have you missed any doses recently?"     Out of medications - 2 weeks 6. OTHER SYMPTOMS: "Do you have any symptoms?" (e.g., blurred vision, chest pain, difficulty breathing, headache, weakness)     Blurred vision, dizziness, headache  Protocols used: Blood Pressure - High-A-AH

## 2022-09-06 NOTE — Progress Notes (Signed)
Erroneous encounter-disregard

## 2022-09-08 ENCOUNTER — Encounter: Payer: Commercial Managed Care - HMO | Admitting: Family

## 2022-09-08 DIAGNOSIS — I10 Essential (primary) hypertension: Secondary | ICD-10-CM

## 2022-09-08 DIAGNOSIS — E785 Hyperlipidemia, unspecified: Secondary | ICD-10-CM
